# Patient Record
Sex: Female | Born: 1955 | Race: White | Hispanic: No | Marital: Married | Smoking: Former smoker
Health system: Southern US, Community
[De-identification: ages and names within clinical notes are randomized; demographics above are authoritative.]

## PROBLEM LIST (undated history)

## (undated) DIAGNOSIS — R51 Headache: Secondary | ICD-10-CM

## (undated) DIAGNOSIS — M8580 Other specified disorders of bone density and structure, unspecified site: Secondary | ICD-10-CM

## (undated) DIAGNOSIS — Z808 Family history of malignant neoplasm of other organs or systems: Secondary | ICD-10-CM

## (undated) DIAGNOSIS — L409 Psoriasis, unspecified: Secondary | ICD-10-CM

## (undated) DIAGNOSIS — R112 Nausea with vomiting, unspecified: Secondary | ICD-10-CM

## (undated) DIAGNOSIS — R519 Headache, unspecified: Secondary | ICD-10-CM

## (undated) DIAGNOSIS — T4145XA Adverse effect of unspecified anesthetic, initial encounter: Secondary | ICD-10-CM

## (undated) DIAGNOSIS — M1990 Unspecified osteoarthritis, unspecified site: Secondary | ICD-10-CM

## (undated) DIAGNOSIS — H8109 Meniere's disease, unspecified ear: Secondary | ICD-10-CM

## (undated) DIAGNOSIS — Z803 Family history of malignant neoplasm of breast: Secondary | ICD-10-CM

## (undated) DIAGNOSIS — J189 Pneumonia, unspecified organism: Secondary | ICD-10-CM

## (undated) DIAGNOSIS — Z8042 Family history of malignant neoplasm of prostate: Secondary | ICD-10-CM

## (undated) DIAGNOSIS — H9319 Tinnitus, unspecified ear: Secondary | ICD-10-CM

## (undated) DIAGNOSIS — F419 Anxiety disorder, unspecified: Secondary | ICD-10-CM

## (undated) DIAGNOSIS — Z9889 Other specified postprocedural states: Secondary | ICD-10-CM

## (undated) DIAGNOSIS — K759 Inflammatory liver disease, unspecified: Secondary | ICD-10-CM

## (undated) DIAGNOSIS — R5383 Other fatigue: Secondary | ICD-10-CM

## (undated) DIAGNOSIS — C801 Malignant (primary) neoplasm, unspecified: Secondary | ICD-10-CM

## (undated) DIAGNOSIS — F32A Depression, unspecified: Secondary | ICD-10-CM

## (undated) DIAGNOSIS — R9431 Abnormal electrocardiogram [ECG] [EKG]: Secondary | ICD-10-CM

## (undated) DIAGNOSIS — K589 Irritable bowel syndrome without diarrhea: Secondary | ICD-10-CM

## (undated) DIAGNOSIS — T8859XA Other complications of anesthesia, initial encounter: Secondary | ICD-10-CM

## (undated) DIAGNOSIS — K2270 Barrett's esophagus without dysplasia: Secondary | ICD-10-CM

## (undated) HISTORY — DX: Family history of malignant neoplasm of breast: Z80.3

## (undated) HISTORY — DX: Headache, unspecified: R51.9

## (undated) HISTORY — DX: Headache: R51

## (undated) HISTORY — DX: Family history of malignant neoplasm of prostate: Z80.42

## (undated) HISTORY — DX: Meniere's disease, unspecified ear: H81.09

## (undated) HISTORY — DX: Other fatigue: R53.83

## (undated) HISTORY — DX: Depression, unspecified: F32.A

## (undated) HISTORY — DX: Other specified disorders of bone density and structure, unspecified site: M85.80

## (undated) HISTORY — PX: BREAST SURGERY: SHX581

## (undated) HISTORY — PX: DILATION AND CURETTAGE OF UTERUS: SHX78

## (undated) HISTORY — DX: Abnormal electrocardiogram (ECG) (EKG): R94.31

## (undated) HISTORY — DX: Malignant (primary) neoplasm, unspecified: C80.1

## (undated) HISTORY — DX: Unspecified osteoarthritis, unspecified site: M19.90

## (undated) HISTORY — DX: Psoriasis, unspecified: L40.9

## (undated) HISTORY — DX: Family history of malignant neoplasm of other organs or systems: Z80.8

## (undated) HISTORY — PX: OTHER SURGICAL HISTORY: SHX169

## (undated) HISTORY — DX: Irritable bowel syndrome, unspecified: K58.9

## (undated) HISTORY — DX: Anxiety disorder, unspecified: F41.9

## (undated) HISTORY — PX: TONSILLECTOMY: SUR1361

---

## 1986-06-24 DIAGNOSIS — H9312 Tinnitus, left ear: Secondary | ICD-10-CM | POA: Insufficient documentation

## 2005-02-15 ENCOUNTER — Ambulatory Visit: Payer: Self-pay | Admitting: Internal Medicine

## 2006-07-28 ENCOUNTER — Encounter (INDEPENDENT_AMBULATORY_CARE_PROVIDER_SITE_OTHER): Payer: Self-pay | Admitting: Specialist

## 2006-07-28 ENCOUNTER — Ambulatory Visit (HOSPITAL_COMMUNITY): Admission: RE | Admit: 2006-07-28 | Discharge: 2006-07-28 | Payer: Self-pay | Admitting: *Deleted

## 2008-04-23 ENCOUNTER — Encounter: Admission: RE | Admit: 2008-04-23 | Discharge: 2008-04-23 | Payer: Self-pay | Admitting: Otolaryngology

## 2009-02-26 ENCOUNTER — Encounter: Payer: Self-pay | Admitting: Internal Medicine

## 2010-03-01 ENCOUNTER — Encounter: Payer: Self-pay | Admitting: Internal Medicine

## 2010-03-02 ENCOUNTER — Encounter: Payer: Self-pay | Admitting: Internal Medicine

## 2010-03-08 ENCOUNTER — Encounter: Payer: Self-pay | Admitting: Internal Medicine

## 2010-03-15 ENCOUNTER — Encounter: Payer: Self-pay | Admitting: Internal Medicine

## 2010-03-15 DIAGNOSIS — R141 Gas pain: Secondary | ICD-10-CM | POA: Insufficient documentation

## 2010-03-15 DIAGNOSIS — R143 Flatulence: Secondary | ICD-10-CM

## 2010-03-15 DIAGNOSIS — N281 Cyst of kidney, acquired: Secondary | ICD-10-CM | POA: Insufficient documentation

## 2010-03-15 DIAGNOSIS — G609 Hereditary and idiopathic neuropathy, unspecified: Secondary | ICD-10-CM | POA: Insufficient documentation

## 2010-03-15 DIAGNOSIS — R5383 Other fatigue: Secondary | ICD-10-CM

## 2010-03-15 DIAGNOSIS — R5381 Other malaise: Secondary | ICD-10-CM | POA: Insufficient documentation

## 2010-03-15 DIAGNOSIS — R142 Eructation: Secondary | ICD-10-CM

## 2010-03-15 DIAGNOSIS — R109 Unspecified abdominal pain: Secondary | ICD-10-CM | POA: Insufficient documentation

## 2010-03-15 DIAGNOSIS — R110 Nausea: Secondary | ICD-10-CM | POA: Insufficient documentation

## 2010-03-18 ENCOUNTER — Ambulatory Visit: Payer: Self-pay | Admitting: Internal Medicine

## 2010-11-26 ENCOUNTER — Telehealth: Payer: Self-pay | Admitting: Internal Medicine

## 2011-01-05 ENCOUNTER — Ambulatory Visit: Payer: Self-pay | Admitting: Cardiovascular Disease

## 2011-01-18 NOTE — Letter (Signed)
Summary: [facility]   Imported By: Sherian Rein 03/16/2010 07:36:16  _____________________________________________________________________  External Attachment:    Type:   Image     Comment:   External Document

## 2011-01-18 NOTE — Letter (Signed)
Summary: [facility]   Imported By: Sherian Rein 03/16/2010 07:42:35  _____________________________________________________________________  External Attachment:    Type:   Image     Comment:   External Document

## 2011-01-18 NOTE — Letter (Signed)
Summary: Herbert Moors MD  Herbert Moors MD   Imported By: Lester Scenic 04/20/2010 08:12:22  _____________________________________________________________________  External Attachment:    Type:   Image     Comment:   External Document

## 2011-01-18 NOTE — Letter (Signed)
Summary: EGD Instructions  [facility]  [address] [place], [place] [address]   Phone: [phone]  Fax: [phone]       Monique [name]    08/28/1956    MRN: [id number]       Procedure Day /Date: Monday 04/19/10     Arrival Time: 9:30 am     Procedure Time: 10:30 am     Location of Procedure:                    _x  _ [facility] (4th Floor)  PREPARATION FOR ENDOSCOPY   On 04/19/10 THE DAY OF THE PROCEDURE:  1.   No solid foods, milk or milk products are allowed after midnight the night before your procedure.  2.   Do not drink anything colored red or purple.  Avoid juices with pulp.  No orange juice.  3.  You may drink clear liquids until 8:30 am, which is 2 hours before your procedure.                                                                                                CLEAR LIQUIDS INCLUDE: Water Jello Ice Popsicles Tea (sugar ok, no milk/cream) Powdered fruit flavored drinks Coffee (sugar ok, no milk/cream) Gatorade Juice: apple, white grape, white cranberry  Lemonade Clear bullion, consomm, broth Carbonated beverages (any kind) Strained chicken noodle soup Hard Candy   MEDICATION INSTRUCTIONS  Unless otherwise instructed, you should take regular prescription medications with a small sip of water as early as possible the morning of your procedure.                  OTHER INSTRUCTIONS  You will need a responsible adult at least 55 years of age to accompany you and drive you home.   This person must remain in the waiting room during your procedure.  Wear loose fitting clothing that is easily removed.  Leave jewelry and other valuables at home.  However, you may wish to bring a book to read or an iPod/MP3 player to listen to music as you wait for your procedure to start.  Remove all body piercing jewelry and leave at home.  Total time from sign-in until discharge is approximately 2-3 hours.  You should go  home directly after your procedure and rest.  You can resume normal activities the day after your procedure.  The day of your procedure you should not:   Drive   Make legal decisions   Operate machinery   Drink alcohol   Return to work  You will receive specific instructions about eating, activities and medications before you leave.    The above instructions have been reviewed and explained to me by  Hortense Ramal CMA Duncan Dull)  March 18, 2010 3:42 PM    I fully understand and can verbalize these instructions _____________________________ Date 03/18/10

## 2011-01-18 NOTE — Assessment & Plan Note (Signed)
Summary: severe and pain and bloating...em   History of Present Illness Visit Type: Initial Consult Primary GI MD: Lina Sar MD Primary Provider: Herb Grays, MD Requesting Provider: Herb Grays, MD Chief Complaint: Increase in upper abd pain x 6 months. Pt states the pain has radiated upward and she has a lot of bloating. Pt states in the morning she can fit into her pants and in the evening  the pants are very snug.  History of Present Illness:   30 ALT white female with abdominal bloating swelling and a diffuse of discomfort. Her appetite has been good and  she has been tolerating  regular diet. Her weight has increased from  usual 117 pounds to 122. She was evaluated by  [facility]  gastroenterology in 2006 and again in [place]  clinic in 2008 and had a colonoscopy and endoscopy which showed duodenal ulcer. She was put on Protonix 40 mg daily but discontinued it after her insurance refused to cover it. She at that time was on Naproxen . She continues to smoke. Her upper abdominal ultrasound recently showed small right renal cyst a normal gallbladder and common bile duct. Her liver function tests were normal.she currently denies any use of alcohol.   GI Review of Systems    Reports abdominal pain, belching, and  bloating.     Location of  Abdominal pain: upper abdomen.    Denies acid reflux, chest pain, dysphagia with liquids, dysphagia with solids, heartburn, loss of appetite, nausea, vomiting, vomiting blood, weight loss, and  weight gain.      Reports hemorrhoids and  rectal bleeding.     Denies anal fissure, black tarry stools, change in bowel habit, constipation, diarrhea, diverticulosis, fecal incontinence, heme positive stool, irritable bowel syndrome, jaundice, light color stool, liver problems, and  rectal pain.    Current Medications (verified): 1)  Vectical 3 Mcg/gm Oint (Calcitriol) .... Apply Two Times A Day 2)  Analpram-Hc 1-1 % Crea (Hydrocortisone Ace-Pramoxine) .... Apply As  Needed 3)  Alprazolam 0.5 Mg Tabs (Alprazolam) .... Take 1 Tablet By Mouth As Needed Anxiety/sleep 4)  Flexeril 10 Mg Tabs (Cyclobenzaprine Hcl) .... Take 1 Tablet By Mouth Three Times A Day As Needed Spasms 5)  Naproxen 500 Mg Tabs (Naproxen) .... Take 1 Tabelt By Mouth Two Times A Day With Food As Needed For Pain  Allergies (verified): 1)  ! Doxycycline 2)  ! Wellbutrin 3)  ! * Chantix  Past History:  Past Medical History: Reviewed history from 03/15/2010 and no changes required. Current Problems:  RENAL CYST (ICD-593.2) FATIGUE (ICD-780.79) PERIPHERAL NEUROPATHY (ICD-356.9) NAUSEA (ICD-787.02) ABDOMINAL BLOATING (ICD-787.3) ABDOMINAL PAIN, UNSPECIFIED SITE (ICD-789.00)    previously saw: [facility]/EGD [facility]-did not like dr.  Past Surgical History: removal of squamous cell skin cancer Sinus surgery  Family History: No FH of Colon Cancer: Family History of Prostate Cancer:Father Family History of Diabetes: Aunt  Social History: Married Patient currently smokes.  Alcohol Use - yes-socially Illicit Drug Use - no Daily Caffeine Use  Review of Systems       The patient complains of change in vision, fatigue, thirst - excessive, and urination - excessive.         Pertinent positive and negative review of systems were noted in the above HPI. All other ROS was otherwise negative.   Vital Signs:  Patient profile:   55 year old female Height:      61 inches Weight:      122.38 pounds BMI:  23.21 Pulse rate:   84 / minute Pulse rhythm:   regular BP sitting:   108 / 64  (right arm) Cuff size:   regular  Vitals Entered By: Christie Nottingham CMA Duncan Dull) (March 18, 2010 2:50 PM)  Physical Exam  General:  Well developed, well nourished, no acute distress. Eyes:  PERRLA, no icterus. Mouth:  No deformity or lesions, dentition normal. Neck:  Supple; no masses or thyromegaly. Lungs:  Clear throughout to auscultation. Heart:  Regular rate and rhythm;  no murmurs, rubs,  or bruits. Abdomen:  flat relaxed abdomen when lying down. Protuberant abdomen when standing up. There is no hernia. There is an overall decrease in muscle tone in the abdominal wall and some fatty tissue. There is no ascites. No fluid wave. Mild tenderness in epigastrium and subxiphoid area. Rectal:  Soft Hemoccult-negative stool. Extremities:  trace edema. Skin:  Intact without significant lesions or rashes. Psych:  Alert and cooperative. Normal mood and affect.   Impression & Recommendations:  Problem # 1:  ABDOMINAL BLOATING (ICD-787.3) Patient;'s bloating and abdominal protuberance may be a combination of a relaxed abdominal wall as well as weight gain. She definitely has digestive problems and a history of duodenal ulcer which needs to be rechecked. We will restart her on Prilosec 20 mg twice a day and proceed with an upper endoscopy. We will check her for H. pylori at that time. 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  Problem # 3:  ABDOMINAL PAIN, UNSPECIFIED SITE (ICD-789.00) Patient's abdominal ultrasound recently showed a normal gallbladder and poorly visualized pancreas. Orders: EGD (EGD)  Patient Instructions: 1)  Please come for your scheduled endoscopy on 04/19/10 at 10:30 am. You will need to come to [facility] 4th floor at 9:30 am for registration. 2)  Please pick up your prilosec prescription at the pharmacy. We have sent this electronically. 3)  Copy sent to : DTammy Leighton Parody, MD Prescriptions: PRILOSEC 20 MG CPDR (OMEPRAZOLE) Take 1 tablet by mouth two times a day  #60 x 3   Entered by:   Hortense Ramal CMA (AAMA)   Authorized by:   Hart Carwin MD   Signed by:   Hortense Ramal CMA (AAMA) on 03/18/2010   Method used:   Electronically to        [facility] [address] [address] (retail)       [address]       [place], [place]  [address]       Ph: [phone] or  [phone]       Fax: [phone]   RxID:   [id number]

## 2011-01-20 NOTE — Progress Notes (Signed)
Summary: med ?-needs office visit May 2012  Phone Note Call from Patient Call back at [facility] Phone [phone]   Caller: Patient Call For: Dr. Juanda Chance Reason for Call: Talk to Nurse Summary of Call: would like to discuss Prilosec refill denial Initial call taken by: Vallarie Mare,  November 26, 2010 11:47 AM  Follow-up for Phone Call        Patient states that she is doing much better since taking omeprazole 20 mg daily and is no longer having any symptoms. She also states that she is unable to afford endoscopy. Are you okay with me giving refills of omeprazole without endoscopy since she is better on meds alone? Follow-up by: Lamona Curl CMA Duncan Dull),  November 26, 2010 11:58 AM  Additional Follow-up for Phone Call Additional follow up Details #1::        she may continue the refills till May 2012 which will be 1 year since her OV, she will need an OV at that time if she wants a refill. Additional Follow-up by: Hart Carwin MD,  November 27, 2010 5:34 PM    Additional Follow-up for Phone Call Additional follow up Details #2::    prescription sent. Follow-up by: Lamona Curl CMA (AAMA),  November 29, 2010 10:05 AM  New/Updated Medications: PRILOSEC 20 MG CPDR (OMEPRAZOLE) Take 1 tablet by mouth two times a day Prescriptions: PRILOSEC 20 MG CPDR (OMEPRAZOLE) Take 1 tablet by mouth two times a day  #60 x 4   Entered by:   Lamona Curl CMA (AAMA)   Authorized by:   Hart Carwin MD   Signed by:   Lamona Curl CMA (AAMA) on 11/29/2010   Method used:   Electronically to        [facility] [address] (retail)       [address]       [place], [place]  [address]       Ph: [phone] or [phone]       Fax: [phone]   RxID:   [id number]

## 2011-05-06 NOTE — Op Note (Signed)
Monique [name], Monique [name]               ACCOUNT NO.:  [id number]   MEDICAL RECORD NO.:  [id number]          PATIENT TYPE:  AMB   LOCATION:  DAY                          FACILITY:  [facility]   PHYSICIAN:  Pershing Cox, M.D.DATE OF BIRTH:  [birth year]   DATE OF PROCEDURE:  DATE OF DISCHARGE:                                 OPERATIVE REPORT   PREOPERATIVE DIAGNOSIS:  Carcinoma in situ of the vulva.   POSTOPERATIVE DIAGNOSIS:  Carcinoma in situ of the vulva.   PROCEDURE:  Wide local excision of perineal skin.   SURGEON:  Pershing Cox, M.D.   ANESTHESIA:  General.   INDICATIONS FOR PROCEDURE:  This patient is followed by a dermatologist, Dr.  Danella Deis, who noted a grayish lesion on her vulva.  It showed the lesion was  carcinoma in situ.  The patient was examined in my office and had widespread  psoriasis of the vulva.  It was impossible to be sure where the lesion  stopped and started in the bed of psoriasis.  The patient has been treating  her  vigorously for the psoriasis and now is brought to the operating room  for wide local excision of the suspicious perineal area.   FINDINGS:  Colposcopy was performed by using acetic acid to stain the vulva.  There is a thickened grayish area just beneath the vagina in the midline.  From this lesion which was approximately 3/4 of 1 cm in size, the centimeter  margin was outlined in every direction.  A rhomboid flap was not necessary.   DESCRIPTION OF PROCEDURE:  [name] was brought to the operating room with her  IV in place.  She received 1 gram of Ancef in the holding area.  PAS  stockings were placed on her lower extremities.  Supine on the OR table, IV  sedation was administered, and anesthesia was established without  difficulty.  She was placed into Allen stirrups and colposcopy was  performed; see above.  Once that the lesion had been outlined, the patient  was prepped with a solution of Hibiclens.  She was then draped with sterile  linens.Then 0.5% Marcaine was inserted into the base of the planned excision  site and then a #15 blade was used to incise the marked area.  Metzenbaum  scissors was used to excise the lesion from its base, taking a depth of  about 0.5 cm.  The excised skin was marked at the central margin nearest the  vagina with a suture.  It was covered with a moist gauze to be carried to  pathology later on.  Bovie cautery was used to obtain hemostasis in the base  of the incision.  The 2-0 Vicryl sutures were used to bring the subcutaneous  tissues close together.  I was able to close the skin margins with 2-0  Vicryl sutures.  Initially, mattress  sutures were used to bring the edges together and then interrupted simple  sutures of 4-0 Vicryl were used.  The incision closed very well without  dog  ear.  The incision was cleaned and Dermabond was placed along the  surface of  the incision.  The patient was taken to the recovery room in good condition.      Pershing Cox, M.D.  Electronically Signed     MAJ/MEDQ  D:  07/28/2006  T:  07/28/2006  Job:  [id number]   cc:   Hope M. Danella Deis, M.D.  Fax: [phone]

## 2012-04-25 ENCOUNTER — Encounter: Payer: Self-pay | Admitting: *Deleted

## 2012-05-19 ENCOUNTER — Emergency Department (HOSPITAL_COMMUNITY)
Admission: EM | Admit: 2012-05-19 | Discharge: 2012-05-19 | Disposition: A | Discharge status: Home / Self Care | Payer: PRIVATE HEALTH INSURANCE | Attending: Emergency Medicine | Admitting: Emergency Medicine

## 2012-05-19 ENCOUNTER — Encounter (HOSPITAL_COMMUNITY): Payer: Self-pay

## 2012-05-19 ENCOUNTER — Emergency Department (HOSPITAL_COMMUNITY): Payer: PRIVATE HEALTH INSURANCE

## 2012-05-19 DIAGNOSIS — F17200 Nicotine dependence, unspecified, uncomplicated: Secondary | ICD-10-CM | POA: Insufficient documentation

## 2012-05-19 DIAGNOSIS — Y998 Other external cause status: Secondary | ICD-10-CM | POA: Insufficient documentation

## 2012-05-19 DIAGNOSIS — S91009A Unspecified open wound, unspecified ankle, initial encounter: Secondary | ICD-10-CM | POA: Insufficient documentation

## 2012-05-19 DIAGNOSIS — S81051A Open bite, right knee, initial encounter: Secondary | ICD-10-CM

## 2012-05-19 DIAGNOSIS — W540XXA Bitten by dog, initial encounter: Secondary | ICD-10-CM | POA: Insufficient documentation

## 2012-05-19 DIAGNOSIS — S81009A Unspecified open wound, unspecified knee, initial encounter: Secondary | ICD-10-CM | POA: Insufficient documentation

## 2012-05-19 DIAGNOSIS — Y9389 Activity, other specified: Secondary | ICD-10-CM | POA: Insufficient documentation

## 2012-05-19 HISTORY — DX: Barrett's esophagus without dysplasia: K22.70

## 2012-05-19 HISTORY — DX: Tinnitus, unspecified ear: H93.19

## 2012-05-19 MED ORDER — HYDROCODONE-ACETAMINOPHEN 5-325 MG PO TABS: 1.0000 | ORAL_TABLET | ORAL | Status: AC | PRN

## 2012-05-19 MED ORDER — HYDROMORPHONE HCL PF 1 MG/ML IJ SOLN
1.0000 mg | Freq: Once | INTRAMUSCULAR | Status: AC
  Administered 2012-05-19: 1 mg via INTRAMUSCULAR
  Filled 2012-05-19: qty 1

## 2012-05-19 MED ORDER — AMOXICILLIN-POT CLAVULANATE 875-125 MG PO TABS: 1.0000 | ORAL_TABLET | Freq: Two times a day (BID) | ORAL | Status: AC

## 2012-05-19 MED ORDER — OXYCODONE-ACETAMINOPHEN 5-325 MG PO TABS
2.0000 | ORAL_TABLET | Freq: Once | ORAL | Status: AC
  Administered 2012-05-19: 2 via ORAL
  Filled 2012-05-19: qty 2

## 2012-05-19 NOTE — ED Notes (Signed)
Got pt in bed with Dr. and chuck to go under pt's leg. I attempted to have rolling stand up light shine on pt's leg as Dr. Rosalia Hammers requested, but light was just hanging and Dr. Rosalia Hammers expressed she would rather move her to CDU to be able to take care of the pt's needs better.5:36pm JG.

## 2012-05-19 NOTE — ED Provider Notes (Addendum)
History   This chart was scribed for Hilario Quarry, MD by Toya Smothers. The patient was seen in room STRE6/STRE6. Patient's care was started at 1700.  CSN: [id number]  Arrival date & time 05/19/12  1700   First MD Initiated Contact with Patient 05/19/12 1727     Chief Complaint  Patient presents with  . Animal Bite   HPI  CAITLINN KLINKER is a 56 y.o. female who presents to the Emergency Department complaining of moderate severe pain in right knee with associate lacerations and puncture wounds onset 1 hour ago, denying rash and color change. Pt states that she got in the middle her and her neighbors dog whilst they were fighting and was bit on her R knee. Pt states that both of the dogs have had shots and believes that she is up to date on her Tetanus. Pt list allergies to bupropion HCl, Doxycycline, and Varenicline tartrate.   Past Medical History  Diagnosis Date  . Fatigue   . Psoriasis   . Generalized headaches   . Abnormal EKG    Past Surgical History  Procedure Date  . Tonsillectomy   . Skin cancer removal    Family History  Problem Relation Age of Onset  . Heart attack Father   . Hypertension Father   . Hypertension Mother    History  Substance Use Topics  . Smoking status: Passive Smoker    Types: Cigarettes  . Smokeless tobacco: Not on file  . Alcohol Use: Yes     occas    Review of Systems  Skin: Positive for wound. Negative for color change and rash.    Allergies  Bupropion hcl; Doxycycline; and Varenicline tartrate  Home Medications   Current Outpatient Rx  Name Route Sig Dispense Refill  . ALPRAZOLAM 0.5 MG PO TABS Oral Take 0.5 mg by mouth as needed.    . CYCLOBENZAPRINE HCL 10 MG PO TABS Oral Take 10 mg by mouth 3 (three) times daily as needed.    Marland Kitchen MECLIZINE HCL 25 MG PO TABS Oral Take 25 mg by mouth 3 (three) times daily as needed.    Marland Kitchen NAPROXEN 500 MG PO TABS Oral Take 500 mg by mouth 2 (two) times daily as needed.    Marland Kitchen NITROFURANTOIN  MACROCRYSTAL 100 MG PO CAPS Oral Take 100 mg by mouth as needed.    Marland Kitchen OMEPRAZOLE 20 MG PO CPDR Oral Take 20 mg by mouth 2 (two) times daily.    Marland Kitchen HYDROCORTISONE ACE-PRAMOXINE 1-1 % RE CREA Rectal Place rectally as needed.      BP 165/76  Pulse 87  Temp(Src) 98.2 F (36.8 C) (Oral)  Resp 18  SpO2 97%  Physical Exam  Musculoskeletal:       3 cm laceration superior lateral aspect of R knee. Jagged stellate total 4 cm just lateral knee.2 Puncture to medial anterior aspect of the R knee    ED Course  Procedures (including critical care time)  DIAGNOSTIC STUDIES: Oxygen Saturation is 97% on room air, normal by my interpretation.    COORDINATION OF CARE: 5:30PM- Evaluated state of Pt's present illness 5:37PM- Consult to verify need.   WEST,EMILY B Pt with dog bites to right knee. Tetanus is UTD,  Dogs' rabies vx are UTD. Xray discussed with patient. Wounds thoroughly irrigated by nurses with high power pressure irrigation from OR, 3L used.    LACERATION REPAIR Performed by: Rise Patience Consent: Verbal consent obtained. Risks and benefits: risks, benefits and alternatives  were discussed Patient identity confirmed: provided demographic data Time out performed prior to procedure Prepped and Draped in normal sterile fashion Wound explored  Laceration Location: right knee  Laceration Length: 10cm  No Foreign Bodies seen or palpated  Anesthesia: local infiltration  Local anesthetic: lidocaine 2% no epinephrine  Anesthetic total: 10 ml  Irrigation method: see above Amount of cleaning: extensive (see above)  Skin closure: 4-0 prolene  Number of sutures or staples: 3  Technique: simple interrupted.  Very loose approximation of tissues (2 lacerations, one linear, one stellate)  Patient tolerance: Patient tolerated the procedure well with no immediate complications.  Discussed wound care with patient, return precautions.  Pt d/c home with augmentin, pain medication.     End section by WEST,EMILY B    Labs Reviewed - No data to display Dg Knee Complete 4 Views Right  05/19/2012  *RADIOLOGY REPORT*  Clinical Data: Dog bite  RIGHT KNEE - COMPLETE 4+ VIEW  Comparison: None  Findings: No joint effusion.  No fracture or subluxation identified.  No radiopaque foreign bodies or soft tissue calcifications.  There are multifocal areas of soft tissue emphysema within the medial,  anterior and lateral soft tissues overlying the right knee.  IMPRESSION:  1.  Soft tissue emphysema. 2.  No bony abnormalities.  Original Report Authenticated By: Rosealee Albee, M.D.     1. Dog bite of knee, right, initial encounter       MDM  I personally performed the services described in this documentation, which was scribed in my presence. The recorded information has been reviewed and considered.   I performed a history and physical examination of Bertram Gala and discussed her management with Ms. Chad  I agree with the history, physical, assessment, and plan of care, with the following exceptions: None  I was present for the following procedures: laceration repair. Time Spent in Critical Care of the patient: None Time spent in discussions with the patient and family:10  Gertha Lichtenberg Corlis Leak, MD 05/26/12 1735  Hilario Quarry, MD 05/26/12 1735

## 2012-05-19 NOTE — Discharge Instructions (Signed)
Read the information below.  Keep the wounds clean and covered with plain gauze until they are healed. Take the antibiotics until they are gone.  You may take ibuprofen while taking the prescribed pain medication but please do not take additional tylenol to avoid overdose.  Return to the ER immediately if you develop redness, swelling, pus draining from the wound, or fevers greater than 100.4.  You may return to the ER at any time for worsening condition or any new symptoms that concern you.

## 2012-05-19 NOTE — ED Notes (Signed)
Pt with dog bite to right knee,

## 2016-06-17 DIAGNOSIS — H8109 Meniere's disease, unspecified ear: Secondary | ICD-10-CM | POA: Insufficient documentation

## 2016-12-19 DIAGNOSIS — C50919 Malignant neoplasm of unspecified site of unspecified female breast: Secondary | ICD-10-CM

## 2016-12-19 HISTORY — DX: Malignant neoplasm of unspecified site of unspecified female breast: C50.919

## 2016-12-19 HISTORY — PX: MASTECTOMY: SHX3

## 2017-05-04 ENCOUNTER — Other Ambulatory Visit: Payer: Self-pay | Admitting: Obstetrics & Gynecology

## 2017-05-04 ENCOUNTER — Other Ambulatory Visit (HOSPITAL_COMMUNITY)
Admission: RE | Admit: 2017-05-04 | Discharge: 2017-05-04 | Disposition: A | Discharge status: Home / Self Care | Payer: BLUE CROSS/BLUE SHIELD | Source: Ambulatory Visit | Attending: Obstetrics & Gynecology | Admitting: Obstetrics & Gynecology

## 2017-05-04 DIAGNOSIS — N6322 Unspecified lump in the left breast, upper inner quadrant: Secondary | ICD-10-CM | POA: Diagnosis not present

## 2017-05-04 DIAGNOSIS — Z01411 Encounter for gynecological examination (general) (routine) with abnormal findings: Secondary | ICD-10-CM | POA: Diagnosis not present

## 2017-05-04 DIAGNOSIS — Z124 Encounter for screening for malignant neoplasm of cervix: Secondary | ICD-10-CM | POA: Insufficient documentation

## 2017-05-04 DIAGNOSIS — N6459 Other signs and symptoms in breast: Secondary | ICD-10-CM | POA: Diagnosis not present

## 2017-05-04 DIAGNOSIS — N941 Unspecified dyspareunia: Secondary | ICD-10-CM | POA: Diagnosis not present

## 2017-05-08 LAB — CYTOLOGY - PAP
DIAGNOSIS: NEGATIVE
HPV (WINDOPATH): NOT DETECTED

## 2017-05-09 ENCOUNTER — Other Ambulatory Visit: Payer: Self-pay | Admitting: Obstetrics & Gynecology

## 2017-05-09 ENCOUNTER — Ambulatory Visit
Admission: RE | Admit: 2017-05-09 | Discharge: 2017-05-09 | Disposition: A | Discharge status: Home / Self Care | Payer: BLUE CROSS/BLUE SHIELD | Source: Ambulatory Visit | Attending: Obstetrics & Gynecology | Admitting: Obstetrics & Gynecology

## 2017-05-09 DIAGNOSIS — R928 Other abnormal and inconclusive findings on diagnostic imaging of breast: Secondary | ICD-10-CM | POA: Diagnosis not present

## 2017-05-09 DIAGNOSIS — N6459 Other signs and symptoms in breast: Secondary | ICD-10-CM

## 2017-05-09 DIAGNOSIS — N6489 Other specified disorders of breast: Secondary | ICD-10-CM | POA: Diagnosis not present

## 2017-05-09 DIAGNOSIS — R599 Enlarged lymph nodes, unspecified: Secondary | ICD-10-CM

## 2017-05-09 DIAGNOSIS — N6320 Unspecified lump in the left breast, unspecified quadrant: Secondary | ICD-10-CM

## 2017-05-11 ENCOUNTER — Other Ambulatory Visit: Payer: Self-pay | Admitting: Obstetrics & Gynecology

## 2017-05-11 DIAGNOSIS — N6320 Unspecified lump in the left breast, unspecified quadrant: Secondary | ICD-10-CM

## 2017-05-12 ENCOUNTER — Ambulatory Visit
Admission: RE | Admit: 2017-05-12 | Discharge: 2017-05-12 | Disposition: A | Discharge status: Home / Self Care | Payer: BLUE CROSS/BLUE SHIELD | Source: Ambulatory Visit | Attending: Obstetrics & Gynecology | Admitting: Obstetrics & Gynecology

## 2017-05-12 DIAGNOSIS — R599 Enlarged lymph nodes, unspecified: Secondary | ICD-10-CM

## 2017-05-12 DIAGNOSIS — N6320 Unspecified lump in the left breast, unspecified quadrant: Secondary | ICD-10-CM

## 2017-05-12 DIAGNOSIS — C50412 Malignant neoplasm of upper-outer quadrant of left female breast: Secondary | ICD-10-CM | POA: Diagnosis not present

## 2017-05-12 DIAGNOSIS — N6321 Unspecified lump in the left breast, upper outer quadrant: Secondary | ICD-10-CM | POA: Diagnosis not present

## 2017-05-12 DIAGNOSIS — R590 Localized enlarged lymph nodes: Secondary | ICD-10-CM | POA: Diagnosis not present

## 2017-05-17 ENCOUNTER — Telehealth: Payer: Self-pay | Admitting: *Deleted

## 2017-05-17 NOTE — Telephone Encounter (Signed)
Confirmed [facility] for 05/24/17 at 815am .  Instructions and contact information given.

## 2017-05-23 ENCOUNTER — Other Ambulatory Visit: Payer: Self-pay | Admitting: *Deleted

## 2017-05-23 DIAGNOSIS — C50919 Malignant neoplasm of unspecified site of unspecified female breast: Secondary | ICD-10-CM

## 2017-05-24 ENCOUNTER — Ambulatory Visit: Payer: BLUE CROSS/BLUE SHIELD | Attending: Surgery | Admitting: Physical Therapy

## 2017-05-24 ENCOUNTER — Encounter: Payer: Self-pay | Admitting: Physical Therapy

## 2017-05-24 ENCOUNTER — Ambulatory Visit (HOSPITAL_BASED_OUTPATIENT_CLINIC_OR_DEPARTMENT_OTHER): Payer: BLUE CROSS/BLUE SHIELD | Admitting: Hematology and Oncology

## 2017-05-24 ENCOUNTER — Encounter: Payer: Self-pay | Admitting: Hematology and Oncology

## 2017-05-24 ENCOUNTER — Other Ambulatory Visit: Payer: Self-pay | Admitting: *Deleted

## 2017-05-24 ENCOUNTER — Other Ambulatory Visit (HOSPITAL_BASED_OUTPATIENT_CLINIC_OR_DEPARTMENT_OTHER): Payer: BLUE CROSS/BLUE SHIELD

## 2017-05-24 ENCOUNTER — Telehealth: Payer: Self-pay | Admitting: *Deleted

## 2017-05-24 ENCOUNTER — Ambulatory Visit
Admission: RE | Admit: 2017-05-24 | Discharge: 2017-05-24 | Disposition: A | Discharge status: Home / Self Care | Payer: BLUE CROSS/BLUE SHIELD | Source: Ambulatory Visit | Attending: Radiation Oncology | Admitting: Radiation Oncology

## 2017-05-24 ENCOUNTER — Encounter: Payer: Self-pay | Admitting: *Deleted

## 2017-05-24 ENCOUNTER — Ambulatory Visit: Payer: BLUE CROSS/BLUE SHIELD | Admitting: Oncology

## 2017-05-24 DIAGNOSIS — Z170 Estrogen receptor positive status [ER+]: Secondary | ICD-10-CM | POA: Diagnosis not present

## 2017-05-24 DIAGNOSIS — R293 Abnormal posture: Secondary | ICD-10-CM | POA: Diagnosis not present

## 2017-05-24 DIAGNOSIS — C50412 Malignant neoplasm of upper-outer quadrant of left female breast: Secondary | ICD-10-CM | POA: Insufficient documentation

## 2017-05-24 DIAGNOSIS — G8929 Other chronic pain: Secondary | ICD-10-CM | POA: Diagnosis not present

## 2017-05-24 DIAGNOSIS — M25512 Pain in left shoulder: Secondary | ICD-10-CM | POA: Diagnosis not present

## 2017-05-24 DIAGNOSIS — Z7722 Contact with and (suspected) exposure to environmental tobacco smoke (acute) (chronic): Secondary | ICD-10-CM | POA: Diagnosis not present

## 2017-05-24 DIAGNOSIS — C50912 Malignant neoplasm of unspecified site of left female breast: Secondary | ICD-10-CM | POA: Diagnosis not present

## 2017-05-24 DIAGNOSIS — M25612 Stiffness of left shoulder, not elsewhere classified: Secondary | ICD-10-CM | POA: Diagnosis not present

## 2017-05-24 DIAGNOSIS — C50919 Malignant neoplasm of unspecified site of unspecified female breast: Secondary | ICD-10-CM

## 2017-05-24 LAB — CBC WITH DIFFERENTIAL/PLATELET
BASO%: 0.9 % (ref 0.0–2.0)
BASOS ABS: 0.1 10*3/uL (ref 0.0–0.1)
EOS ABS: 0.3 10*3/uL (ref 0.0–0.5)
EOS%: 4.6 % (ref 0.0–7.0)
HEMATOCRIT: 41.6 % (ref 34.8–46.6)
HEMOGLOBIN: 14.1 g/dL (ref 11.6–15.9)
LYMPH#: 2.6 10*3/uL (ref 0.9–3.3)
LYMPH%: 34.2 % (ref 14.0–49.7)
MCH: 29.8 pg (ref 25.1–34.0)
MCHC: 34 g/dL (ref 31.5–36.0)
MCV: 87.5 fL (ref 79.5–101.0)
MONO#: 0.4 10*3/uL (ref 0.1–0.9)
MONO%: 5.9 % (ref 0.0–14.0)
NEUT#: 4.1 10*3/uL (ref 1.5–6.5)
NEUT%: 54.4 % (ref 38.4–76.8)
PLATELETS: 272 10*3/uL (ref 145–400)
RBC: 4.75 10*6/uL (ref 3.70–5.45)
RDW: 13.1 % (ref 11.2–14.5)
WBC: 7.5 10*3/uL (ref 3.9–10.3)

## 2017-05-24 LAB — COMPREHENSIVE METABOLIC PANEL
ALBUMIN: 4.1 g/dL (ref 3.5–5.0)
ALK PHOS: 67 U/L (ref 40–150)
ALT: 19 U/L (ref 0–55)
ANION GAP: 9 meq/L (ref 3–11)
AST: 22 U/L (ref 5–34)
BUN: 6 mg/dL — AB (ref 7.0–26.0)
CALCIUM: 9.8 mg/dL (ref 8.4–10.4)
CO2: 27 mEq/L (ref 22–29)
Chloride: 101 mEq/L (ref 98–109)
Creatinine: 0.8 mg/dL (ref 0.6–1.1)
EGFR: 84 mL/min/{1.73_m2} — AB (ref >90)
Glucose: 98 mg/dl (ref 70–140)
POTASSIUM: 4.1 meq/L (ref 3.5–5.1)
Sodium: 138 mEq/L (ref 136–145)
Total Bilirubin: 0.48 mg/dL (ref 0.20–1.20)
Total Protein: 7.1 g/dL (ref 6.4–8.3)

## 2017-05-24 LAB — DRAW EXTRA CLOT TUBE

## 2017-05-24 MED ORDER — ANASTROZOLE 1 MG PO TABS: 1.0000 mg | ORAL_TABLET | Freq: Every day | ORAL | 3 refills | Status: DC

## 2017-05-24 NOTE — Progress Notes (Signed)
Clinical Social Work [place] Psychosocial Distress Screening [place]  Patient completed distress screening protocol and scored a 6 on the Psychosocial Distress Thermometer which indicates moderate distress. Clinical Social Worker met with patient in [facility] to assess for distress and other psychosocial needs. Patient stated she was feeling overwhelmed but felt "better" after meeting with the treatment team and getting more information on her treatment plan. CSW and patient discussed common feeling and emotions when being diagnosed with cancer, and the importance of support during treatment. CSW informed patient of the support team and support services at [facility], and patient was agreeable to an [employer] referral. CSW provided contact information and encouraged patient to call with any questions or concerns.   ONCBCN DISTRESS SCREENING 05/24/2017  Screening Type Initial Screening  Distress experienced in past week (1-10) 6  Family Problem type Partner  Emotional problem type Nervousness/Anxiety  Spiritual/Religous concerns type Relating to God  Information Concerns Type Lack of info about diagnosis;Lack of info about treatment;Lack of info about complementary therapy choices  Physical Problem type Pain;Loss of appetitie;Constipation/diarrhea;Sexual problems  Physician notified of physical symptoms Yes  Referral to support programs Yes    Johnnye Lana, MSW, LCSW, OSW-C Clinical Social Worker [facility] [phone]
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
## 2017-05-24 NOTE — Progress Notes (Signed)
Radiation Oncology         [phone] ________________________________  Name: Monique [name] MRN: [id number]  Date: 05/24/2017  DOB: 1956-04-28  CC:No primary care provider on file.  Alphonsa Overall, MD     REFERRING PHYSICIAN: Alphonsa Overall, MD   DIAGNOSIS: There were no encounter diagnoses.   HISTORY OF PRESENT ILLNESS: Monique [name] is a 61 y.o. female seen in the multidisciplinary breast clinic for a new diagnosis of left breast cancer. She found a palpable mass with nipple inversion in the left breast and underwent diagnostic imaging and this revealed a 2.9 x 2.8 x 1.8 cm mass with irregularity in a left axillary node on  05/09/17. 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 The tumor was ER/PR positive, HER2 negative, and Ki 67 was 15%. She comes today to clinic to discuss options for her care.   PREVIOUS RADIATION THERAPY: No   PAST MEDICAL HISTORY:  Past Medical History:  Diagnosis Date  . Abnormal EKG   . Barrett's esophagus   . Fatigue   . Generalized headaches   . Psoriasis   . Tinnitus    left ear       PAST SURGICAL HISTORY: Past Surgical History:  Procedure Laterality Date  . skin cancer removal    . TONSILLECTOMY       FAMILY HISTORY:  Family History  Problem Relation Age of Onset  . Heart attack Father   . Hypertension Father   . Hypertension Mother      SOCIAL HISTORY:  reports that she is a non-smoker but has been exposed to tobacco smoke. She does not have any smokeless tobacco history on file. She reports that she drinks alcohol. The patient is married and lives in [place]. She is unable to work due to her meniere's disease.     ALLERGIES: Bupropion hcl; Doxycycline; and Varenicline tartrate   MEDICATIONS:  Current Outpatient Prescriptions  Medication Sig Dispense Refill  . ALPRAZolam (XANAX) 0.5 MG tablet Take 0.5 mg by mouth at  bedtime as needed. For anxiety and sleep    . OVER THE COUNTER MEDICATION Take 1 tablet by mouth daily as needed. For dizziness. Medication name unknown per patient.     No current facility-administered medications for this encounter.      REVIEW OF SYSTEMS: On review of systems, the patient reports that she is doing well overall. She denies any chest pain, shortness of breath, cough, fevers, chills, night sweats, unintended weight changes. She denies any bowel or bladder disturbances, and denies abdominal pain, nausea or vomiting. She denies any new musculoskeletal or joint aches or pains. A complete review of systems is obtained and is otherwise negative.  PHYSICAL EXAM:  Wt Readings from Last 3 Encounters:  03/18/10 122 lb 6.1 oz (55.5 kg)   Temp Readings from Last 3 Encounters:  05/19/12 98.6 F (37 C) (Oral)   BP Readings from Last 3 Encounters:  05/19/12 148/76  03/18/10 108/64   Pulse Readings from Last 3 Encounters:  05/19/12 72  03/18/10 84    In general this is a well appearing caucasian female in no acute distress. She is alert and oriented x4 and appropriate throughout the examination. HEENT reveals that the patient is normocephalic, atraumatic. EOMs are intact. PERRLA. Skin is intact without any evidence of gross lesions. Cardiovascular exam reveals a regular rate and rhythm, no  clicks rubs or murmurs are auscultated. Chest is clear to auscultation bilaterally. Lymphatic assessment is performed and does not reveal any adenopathy in the cervical, supraclavicular, axillary, or inguinal chains. Bilateral breast exam is performed and reveals post biopsy changes in the left breast, and no palpable abnormalities in the right. There was no nipple bleeding or discharge of either breast. Abdomen has active bowel sounds in all quadrants and is intact. The abdomen is soft, non tender, non distended. Lower extremities are negative for pretibial pitting edema, deep calf tenderness, cyanosis  or clubbing.   ECOG = 1  0 - Asymptomatic (Fully active, able to carry on all predisease activities without restriction)  1 - Symptomatic but completely ambulatory (Restricted in physically strenuous activity but ambulatory and able to carry out work of a light or sedentary nature. For example, light housework, office work)  2 - Symptomatic, <50% in bed during the day (Ambulatory and capable of all self care but unable to carry out any work activities. Up and about more than 50% of waking hours)  3 - Symptomatic, >50% in bed, but not bedbound (Capable of only limited self-care, confined to bed or chair 50% or more of waking hours)  4 - Bedbound (Completely disabled. Cannot carry on any self-care. Totally confined to bed or chair)  5 - Death   Eustace Pen MM, Creech RH, Tormey DC, et al. [id number]). "Toxicity and response criteria of the [facility]". [place] Oncol. 5 (6): 649-55    LABORATORY DATA:  No results found for: WBC, HGB, HCT, MCV, PLT No results found for: NA, K, CL, CO2 No results found for: ALT, AST, GGT, ALKPHOS, BILITOT    RADIOGRAPHY: US Breast Ltd Uni Left Inc Axilla  Result Date: 05/09/2017 CLINICAL DATA:  Mass felt by the patient in the upper outer left breast for the past month, with nipple inversion. EXAM: 2D DIGITAL DIAGNOSTIC BILATERAL MAMMOGRAM WITH CAD AND ADJUNCT TOMO ULTRASOUND LEFT BREAST COMPARISON:  02/15/2005 at [facility]. ACR Breast Density Category c: The breast tissue is heterogeneously dense, which may obscure small masses. FINDINGS: There is an irregular, spiculated mass in the upper-outer quadrant of the left breast at the location of the mass felt by the patient, marked with a metallic marker. There are no findings elsewhere in either breast suspicious for malignancy. Mammographic images were processed with CAD. On physical exam, there is an approximately 3 cm oval, firm, palpable mass in the 12:30 o'clock  position of the left breast, 2 cm from the nipple. There are no palpable left axillary lymph nodes. Targeted ultrasound is performed, showing a 2.9 x 2.8 x 1.8 cm oval, irregular, hypoechoic mass in the 12:30 o'clock position of the left breast, 2 cm from the nipple. There is some ill-defined surrounding increased echogenicity. No increased or decreased through transmission of sound. Ultrasound of the left axilla demonstrated multiple left axillary lymph nodes with borderline cortical thickening. One of these has a slightly more lobulated and irregular cortex with areas of slightly more focal thickening, measuring up to 4.2 mm in maximum thickness. IMPRESSION: 1. 2.9 x 2.8 x 1.8 cm mass with imaging features highly suspicious for malignancy in the 12:30 o'clock position of the left breast. 2. Left axillary lymph node with mild, lobulated and slightly irregular cortical thickening. This is suspicious for a metastatic node. RECOMMENDATION: Ultrasound-guided core needle biopsy of the 2.9 cm mass in the 12:30 o'clock position of the left breast and ultrasound-guided core needle biopsy  of the suspicious left axillary lymph node. This has been discussed with the patient and scheduled at 7:30 a.m. on 05/12/2017. I have discussed the findings and recommendations with the patient. Results were also provided in writing at the conclusion of the visit. If applicable, a reminder letter will be sent to the patient regarding the next appointment. BI-RADS CATEGORY  5: Highly suggestive of malignancy. Electronically Signed   By: Claudie Revering M.D.   On: 05/09/2017 15:58   Mm Diag Breast Tomo Bilateral  Result Date: 05/09/2017 CLINICAL DATA:  Mass felt by the patient in the upper outer left breast for the past month, with nipple inversion. EXAM: 2D DIGITAL DIAGNOSTIC BILATERAL MAMMOGRAM WITH CAD AND ADJUNCT TOMO ULTRASOUND LEFT BREAST COMPARISON:  02/15/2005 at [facility]. ACR Breast Density Category c: The  breast tissue is heterogeneously dense, which may obscure small masses. FINDINGS: There is an irregular, spiculated mass in the upper-outer quadrant of the left breast at the location of the mass felt by the patient, marked with a metallic marker. There are no findings elsewhere in either breast suspicious for malignancy. Mammographic images were processed with CAD. On physical exam, there is an approximately 3 cm oval, firm, palpable mass in the 12:30 o'clock position of the left breast, 2 cm from the nipple. There are no palpable left axillary lymph nodes. Targeted ultrasound is performed, showing a 2.9 x 2.8 x 1.8 cm oval, irregular, hypoechoic mass in the 12:30 o'clock position of the left breast, 2 cm from the nipple. There is some ill-defined surrounding increased echogenicity. No increased or decreased through transmission of sound. Ultrasound of the left axilla demonstrated multiple left axillary lymph nodes with borderline cortical thickening. One of these has a slightly more lobulated and irregular cortex with areas of slightly more focal thickening, measuring up to 4.2 mm in maximum thickness. IMPRESSION: 1. 2.9 x 2.8 x 1.8 cm mass with imaging features highly suspicious for malignancy in the 12:30 o'clock position of the left breast. 2. Left axillary lymph node with mild, lobulated and slightly irregular cortical thickening. This is suspicious for a metastatic node. RECOMMENDATION: Ultrasound-guided core needle biopsy of the 2.9 cm mass in the 12:30 o'clock position of the left breast and ultrasound-guided core needle biopsy of the suspicious left axillary lymph node. This has been discussed with the patient and scheduled at 7:30 a.m. on 05/12/2017. I have discussed the findings and recommendations with the patient. Results were also provided in writing at the conclusion of the visit. If applicable, a reminder letter will be sent to the patient regarding the next appointment. BI-RADS CATEGORY  5: Highly  suggestive of malignancy. Electronically Signed   By: Claudie Revering M.D.   On: 05/09/2017 15:58   [place] Axillary Node Core Biopsy Left  Addendum Date: 05/16/2017   ADDENDUM REPORT: 05/16/2017 14:11 ADDENDUM: Pathology revealed grade II invasive ductal carcinoma in the LEFT breast and benign lymph node tissue from the LEFT axillary lymph node biopsy. This was found to be concordant by Dr. Franki Cabot. Pathology results were discussed with the patient by telephone. The patient reported doing well after the biopsies with tenderness at the sites. Post biopsy instructions and care were reviewed and questions were answered. The patient was encouraged to call [facility] for any additional concerns. The patient was referred to the [facility] at the [facility] on May 24, 2017. Pathology results reported by Susa Raring RN, BSN on  05/16/2017. Electronically Signed   By: Franki Cabot M.D.   On: 05/16/2017 14:11   Result Date: 05/16/2017 CLINICAL DATA:  Patient with a left breast mass presents today for ultrasound-guided core biopsy. Patient also presents today for ultrasound-guided biopsy of a mildly prominent lymph node in the left axilla. EXAM: ULTRASOUND GUIDED LEFT BREAST CORE NEEDLE BIOPSY ULTRASOUND-GUIDED LEFT AXILLARY LYMPH NODE CORE NEEDLE BIOPSY COMPARISON:  Previous exam(s). PROCEDURE: I met with the patient and we discussed the procedure of ultrasound-guided biopsy, including benefits and alternatives. We discussed the high likelihood of a successful procedure. We discussed the risks of the procedure including infection, bleeding, tissue injury, clip migration, and inadequate sampling. Informed written consent was given. The usual time-out protocol was performed immediately prior to the procedure. Lesion quadrant: Upper outer quadrant Using sterile technique and 1% Lidocaine as local anesthetic, under direct ultrasound visualization,  a 14 gauge spring-loaded device was used to perform biopsy of the mildly prominent lymph node in the left axillausing a lateral approach. At the conclusion of the procedure, a HydroMARK tissue marker clip was deployed into the biopsy cavity. Next, using sterile technique and 1% lidocaine as local anesthetic, under direct ultrasound visualization, a 12 gauge spring-loaded device was used to perform biopsy of the left breast mass at the 12:30 o'clock axis using a lateral approach. At the conclusion of the procedure, a ribbon shaped tissue marker clip was placed into the mass. Follow-up 2-view mammogram was performed and dictated separately. IMPRESSION: Ultrasound-guided biopsies of the left breast mass at the 12:30 o'clock axis and the mildly prominent left axillary lymph node. No apparent complications. Electronically Signed: By: Franki Cabot M.D. On: 05/12/2017 09:02   Mm Clip Placement Left  Result Date: 05/12/2017 CLINICAL DATA:  Status post ultrasound-guided biopsy of a left breast mass. EXAM: DIAGNOSTIC LEFT MAMMOGRAM POST ULTRASOUND BIOPSY COMPARISON:  Previous exam(s). FINDINGS: Mammographic images were obtained following ultrasound guided biopsy of the left breast mass at the 12:30 o'clock axis. At the conclusion of the procedure, a ribbon shaped tissue marker was placed at the biopsy site. Biopsy clip is well-positioned within the mass. IMPRESSION: Ribbon shaped biopsy clip well-positioned within the left breast mass at the 12:30 o'clock axis. Final Assessment: Post Procedure Mammograms for Marker Placement Electronically Signed   By: Franki Cabot M.D.   On: 05/12/2017 09:20   [place] Lt Breast Bx W Loc Dev 1st Lesion Img Bx Spec US Guide  Addendum Date: 05/16/2017   ADDENDUM REPORT: 05/16/2017 14:11 ADDENDUM: Pathology revealed grade II invasive ductal carcinoma in the LEFT breast and benign lymph node tissue from the LEFT axillary lymph node biopsy. This was found to be concordant by Dr. Franki Cabot.  Pathology results were discussed with the patient by telephone. The patient reported doing well after the biopsies with tenderness at the sites. Post biopsy instructions and care were reviewed and questions were answered. The patient was encouraged to call [facility] for any additional concerns. The patient was referred to the [facility] at the [facility] on May 24, 2017. Pathology results reported by Susa Raring RN, BSN on 05/16/2017. Electronically Signed   By: Franki Cabot M.D.   On: 05/16/2017 14:11   Result Date: 05/16/2017 CLINICAL DATA:  Patient with a left breast mass presents today for ultrasound-guided core biopsy. Patient also presents today for ultrasound-guided biopsy of a mildly prominent lymph node in the left axilla. EXAM: ULTRASOUND GUIDED LEFT BREAST CORE  NEEDLE BIOPSY ULTRASOUND-GUIDED LEFT AXILLARY LYMPH NODE CORE NEEDLE BIOPSY COMPARISON:  Previous exam(s). PROCEDURE: I met with the patient and we discussed the procedure of ultrasound-guided biopsy, including benefits and alternatives. We discussed the high likelihood of a successful procedure. We discussed the risks of the procedure including infection, bleeding, tissue injury, clip migration, and inadequate sampling. Informed written consent was given. The usual time-out protocol was performed immediately prior to the procedure. Lesion quadrant: Upper outer quadrant Using sterile technique and 1% Lidocaine as local anesthetic, under direct ultrasound visualization, a 14 gauge spring-loaded device was used to perform biopsy of the mildly prominent lymph node in the left axillausing a lateral approach. At the conclusion of the procedure, a HydroMARK tissue marker clip was deployed into the biopsy cavity. Next, using sterile technique and 1% lidocaine as local anesthetic, under direct ultrasound visualization, a 12 gauge spring-loaded device was used to perform  biopsy of the left breast mass at the 12:30 o'clock axis using a lateral approach. At the conclusion of the procedure, a ribbon shaped tissue marker clip was placed into the mass. Follow-up 2-view mammogram was performed and dictated separately. IMPRESSION: Ultrasound-guided biopsies of the left breast mass at the 12:30 o'clock axis and the mildly prominent left axillary lymph node. No apparent complications. Electronically Signed: By: Franki Cabot M.D. On: 05/12/2017 09:02       IMPRESSION/PLAN: 1. Stage IB, cT2,N0,Mx, grade 2 ER/PR positive invasive ductal carcinoma of the left breast. Dr. Lisbeth Renshaw discusses the pathology findings and reviews the nature of invasive ductal disease. The consensus from the breast conference includesoncotype, and she will be started today on antiestrogen therapy. If she needed chemotherapy she would move forward with this prior to breast conservation with lumpectomy with sentinel mapping versus mastectomy with sentinel node evaluation. Provided that chemotherapy is not  necessary neoadjuvantly or adjuvantly, the patient's course would then be followed by external radiotherapy to the breast followed by antiestrogen therapy. We discussed the risks, benefits, short, and long term effects of radiotherapy, and the patient is interested in proceeding. Dr. Lisbeth Renshaw discusses the delivery and logistics of radiotherapy and would anticipate a course of 4-6 1/2 weeks dependant on her final pathology. We will see her back about 2 weeks after surgery, or 4 weeks after adjuvant chemotherapy if needed to move forward with the simulation and planning process and anticipate starting radiotherapy about 4 weeks after surgery.    The above documentation reflects my direct findings during this shared patient visit. Please see the separate note by Dr. Lisbeth Renshaw on this date for the remainder of the patient's plan of care.    Carola Rhine, PAC

## 2017-05-24 NOTE — Telephone Encounter (Signed)
Received order for oncotype testing on core bx. Requisition sent to [facility] and [facility]. PAC sent to [facility]

## 2017-05-24 NOTE — Progress Notes (Signed)
[facility] NOTE  Patient Care Team: Janyth Pupa, DO as PCP - General (Obstetrics and Gynecology) Janyth Pupa, DO as Consulting Physician (Obstetrics and Gynecology) Alphonsa Overall, MD as Consulting Physician (General Surgery) Magrinat, Virgie Dad, MD as Consulting Physician (Oncology) Kyung Rudd, MD as Consulting Physician (Radiation Oncology)  CHIEF COMPLAINTS/PURPOSE OF CONSULTATION:  Newly diagnosed breast cancer  HISTORY OF PRESENTING ILLNESS:  Monique [name] 61 y.o. female is here because of recent diagnosis left breast cancer. Patient had a palpable left breast mass with nipple inversion for the past 4-5 months. She underwent evaluation with a mammogram and ultrasound. There was a 2.9 cm mass in the left breast with suspicion of lymph node involvement. Biopsy of the breast mass and the lymph node were performed. Lymph node biopsy was benign. 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 She was presented this morning in the multidisciplinary clinic and she is here today to discuss a treatment plan.    I reviewed her records extensively and collaborated the history with the patient.  SUMMARY OF ONCOLOGIC HISTORY:   Malignant neoplasm of upper-outer quadrant of left breast in female, estrogen receptor positive ([place])   05/12/2017 Initial Diagnosis    Palpable left breast mass with nipple inversion, mammogram revealed mass at 12:30 position 2.9 cm with suspicious lymph node in axilla. Biopsy revealed IDC grade 2, ER 100%, PR 95%, Ki-67 15%, HER-2 negative ratio 1.68; lymph node biopsy benign, T2 N0 stage II a clinical stage       MEDICAL HISTORY:  Past Medical History:  Diagnosis Date  . Abnormal EKG   . Barrett's esophagus   . Fatigue   . Generalized headaches   . IBS (irritable bowel syndrome)   . Meniere disease   . Psoriasis   . Tinnitus    left ear    SURGICAL  HISTORY: Past Surgical History:  Procedure Laterality Date  . DILATION AND CURETTAGE OF UTERUS    . skin cancer removal    . TONSILLECTOMY      SOCIAL HISTORY: Social History   Social History  . Marital status: Married    Spouse name: N/A  . Number of children: N/A  . Years of education: N/A   Occupational History  . Not on file.   Social History Main Topics  . Smoking status: Passive Smoke Exposure - Never Smoker    Packs/day: 1.00    Types: Cigarettes  . Smokeless tobacco: Not on file  . Alcohol use Yes     Comment: occas  . Drug use: Unknown  . Sexual activity: Not on file   Other Topics Concern  . Not on file   Social History Narrative  . No narrative on file    FAMILY HISTORY: Family History  Problem Relation Age of Onset  . Heart attack Father   . Hypertension Father   . Hypertension Mother     ALLERGIES:  is allergic to bupropion; bupropion hcl; doxycycline; and varenicline tartrate.  MEDICATIONS:  Current Outpatient Prescriptions  Medication Sig Dispense Refill  . ALPRAZolam (XANAX) 0.5 MG tablet Take 0.5 mg by mouth at bedtime as needed. For anxiety and sleep    . HYDROcodone-acetaminophen (NORCO/VICODIN) 5-325 MG tablet Take 1 tablet by mouth every 6 (six) hours as needed for moderate pain.    Marland Kitchen OVER THE COUNTER MEDICATION Take 1 tablet by mouth daily as needed. For dizziness. Medication name  unknown per patient.    Marland Kitchen anastrozole (ARIMIDEX) 1 MG tablet Take 1 tablet (1 mg total) by mouth daily. 90 tablet 3   No current facility-administered medications for this visit.     REVIEW OF SYSTEMS:   Constitutional: Denies fevers, chills or abnormal night sweats Eyes: Denies blurriness of vision, double vision or watery eyes Ears, nose, mouth, throat, and face: Denies mucositis or sore throat Respiratory: Denies cough, dyspnea or wheezes Cardiovascular: Denies palpitation, chest discomfort or lower extremity swelling Gastrointestinal:  Denies nausea,  heartburn or change in bowel habits Skin: Denies abnormal skin rashes Lymphatics: Denies new lymphadenopathy or easy bruising Neurological:Denies numbness, tingling or new weaknesses Behavioral/Psych: Mood is stable, no new changes  Breast: Nipple inversion palpable lump All other systems were reviewed with the patient and are negative.  PHYSICAL EXAMINATION: ECOG PERFORMANCE STATUS: 1 - Symptomatic but completely ambulatory  Vitals:   05/24/17 0844  BP: 123/82  Pulse: 67  Resp: 18  Temp: 98.2 F (36.8 C)   Filed Weights   05/24/17 0844  Weight: 110 lb 1.6 oz (49.9 kg)    GENERAL:alert, no distress and comfortable SKIN: skin color, texture, turgor are normal, no rashes or significant lesions EYES: normal, conjunctiva are pink and non-injected, sclera clear OROPHARYNX:no exudate, no erythema and lips, buccal mucosa, and tongue normal  NECK: supple, thyroid normal size, non-tender, without nodularity LYMPH:  no palpable lymphadenopathy in the cervical, axillary or inguinal LUNGS: clear to auscultation and percussion with normal breathing effort HEART: regular rate & rhythm and no murmurs and no lower extremity edema ABDOMEN:abdomen soft, non-tender and normal bowel sounds Musculoskeletal:no cyanosis of digits and no clubbing  PSYCH: alert & oriented x 3 with fluent speech NEURO: no focal motor/sensory deficits BREAST:Nipple inversion palpable lump. No palpable axillary or supraclavicular lymphadenopathy (exam performed in the presence of a chaperone)   LABORATORY DATA:  I have reviewed the data as listed Lab Results  Component Value Date   WBC 7.5 05/24/2017   HGB 14.1 05/24/2017   HCT 41.6 05/24/2017   MCV 87.5 05/24/2017   PLT 272 05/24/2017   Lab Results  Component Value Date   NA 138 05/24/2017   K 4.1 05/24/2017   CO2 27 05/24/2017    RADIOGRAPHIC STUDIES: I have personally reviewed the radiological reports and agreed with the findings in the  report.  ASSESSMENT AND PLAN:  Malignant neoplasm of upper-outer quadrant of left breast in female, estrogen receptor positive ([place]) 05/17/2017: Palpable left breast mass with nipple inversion, mammogram revealed mass at 12:30 position 2.9 cm with suspicious lymph node in axilla. Biopsy revealed IDC grade 2, ER 100%, PR 95%, Ki-67 15%, HER-2 negative ratio 1.68; lymph node biopsy benign, T2 N0 stage II a clinical stage  Pathology and radiology counseling: Discussed with the patient, the details of pathology including the type of breast cancer,the clinical staging, the significance of ER, PR and HER-2/neu receptors and the implications for treatment. After reviewing the pathology in detail, we proceeded to discuss the different treatment options between surgery, radiation, chemotherapy, antiestrogen therapies.  Recommendation based multidisciplinary tumor board: 1. Oncotype DX to determine if she would benefit from systemic chemotherapy 2. neoadjuvant chemotherapy versus endocrine therapy 3. Followed by breast conserving surgery 4. Followed by radiation 5. Followed by antiestrogen therapy  Return to clinic based upon Oncotype DX test result. I recommended starting antiestrogen therapy from today.  Anastrozole counseling: We discussed the risks and benefits of anti-estrogen therapy with aromatase inhibitors. These include but  not limited to insomnia, hot flashes, mood changes, vaginal dryness, bone density loss, and weight gain. We strongly believe that the benefits far outweigh the risks. Patient understands these risks and consented to starting treatment.   Oncotype counseling: I discussed Oncotype DX test. I explained to the patient that this is a 21 gene panel to evaluate patient tumors DNA to calculate recurrence score. This would help determine whether patient has high risk or intermediate risk or low risk breast cancer. She understands that if her tumor was found to be high risk, she would  benefit from systemic chemotherapy. If low risk, no need of chemotherapy. If she was found to be intermediate risk, we would need to evaluate the score as well as other risk factors and determine if an abbreviated chemotherapy may be of benefit.  Return to clinic once we have Oncotype test results.   All questions were answered. The patient knows to call the clinic with any problems, questions or concerns.    Rulon Eisenmenger, MD 05/24/17

## 2017-05-24 NOTE — Progress Notes (Signed)
Nutrition Assessment  Reason for Assessment:  Pt seen in Breast Clinic  ASSESSMENT:   61 year old female with new diagnosis of left breast cancer.  Past medical history meniere's disease, IBS.  Medications:  reviewed  Labs: reviewed  Anthropometrics:   Height: 61 inches Weight: 110 lb BMI: 20.8   NUTRITION DIAGNOSIS: Food and nutrition related knowledge deficit related to new diagnosis of breast cancer as evidenced by no prior need for nutrition related information.  INTERVENTION:   Discussed and provided packet of information regarding nutritional tips for breast cancer patients.  Questions answered.  Teachback method used.  Contact information provided and patient knows to contact me with questions/concerns.    MONITORING, EVALUATION, and GOAL: Pt will consume a healthy plant based diet to maintain lean body mass throughout treatment.   Mayerli Kirst B. Zenia Resides, [place], [place] Registered Dietitian [phone] (pager)

## 2017-05-24 NOTE — Patient Instructions (Signed)

## 2017-05-24 NOTE — Assessment & Plan Note (Signed)
05/17/2017: Palpable left breast mass with nipple inversion, mammogram revealed mass at 12:30 position 2.9 cm with suspicious lymph node in axilla. Biopsy revealed IDC grade 2, ER 100%, PR 95%, Ki-67 15%, HER-2 negative ratio 1.68; lymph node biopsy benign, T2 N0 stage II a clinical stage  Pathology and radiology counseling: Discussed with the patient, the details of pathology including the type of breast cancer,the clinical staging, the significance of ER, PR and HER-2/neu receptors and the implications for treatment. After reviewing the pathology in detail, we proceeded to discuss the different treatment options between surgery, radiation, chemotherapy, antiestrogen therapies.  Recommendation based multidisciplinary tumor board: 1. Oncotype DX to determine if she would benefit from systemic chemotherapy 2. neoadjuvant chemotherapy versus endocrine therapy 3. Followed by breast conserving surgery 4. Followed by radiation 5. Followed by antiestrogen therapy  Return to clinic based upon Oncotype DX test result. I recommended starting antiestrogen therapy from today.  Anastrozole counseling: We discussed the risks and benefits of anti-estrogen therapy with aromatase inhibitors. These include but not limited to insomnia, hot flashes, mood changes, vaginal dryness, bone density loss, and weight gain. We strongly believe that the benefits far outweigh the risks. Patient understands these risks and consented to starting treatment.   Oncotype counseling: I discussed Oncotype DX test. I explained to the patient that this is a 21 gene panel to evaluate patient tumors DNA to calculate recurrence score. This would help determine whether patient has high risk or intermediate risk or low risk breast cancer. She understands that if her tumor was found to be high risk, she would benefit from systemic chemotherapy. If low risk, no need of chemotherapy. If she was found to be intermediate risk, we would need to  evaluate the score as well as other risk factors and determine if an abbreviated chemotherapy may be of benefit.  Return to clinic once we have Oncotype test results.

## 2017-05-25 ENCOUNTER — Other Ambulatory Visit: Payer: BLUE CROSS/BLUE SHIELD

## 2017-05-25 ENCOUNTER — Encounter: Payer: BLUE CROSS/BLUE SHIELD | Admitting: Genetics

## 2017-05-25 ENCOUNTER — Telehealth: Payer: Self-pay | Admitting: *Deleted

## 2017-05-25 NOTE — Telephone Encounter (Signed)
Receive call from patient stating that she is thinking about just having surgery up front and having a mastectomy because she would like to have everything done in this year due to meeting her deductible. She informed me that they had to sell her husband truck to meet their $6,000 deductible for this year.  She is scheduled for an MRI this Saturday.  I spoke with Dr. Lucia Gaskins and would prefer her to keep her appointment for her Breast MRI due to the density of her breasts.  He will call her after his clinic today to discuss.    Patient is ok with that.

## 2017-05-27 ENCOUNTER — Ambulatory Visit (HOSPITAL_COMMUNITY)
Admission: RE | Admit: 2017-05-27 | Discharge: 2017-05-27 | Disposition: A | Discharge status: Home / Self Care | Payer: BLUE CROSS/BLUE SHIELD | Source: Ambulatory Visit | Attending: Surgery | Admitting: Surgery

## 2017-05-27 DIAGNOSIS — Z170 Estrogen receptor positive status [ER+]: Secondary | ICD-10-CM | POA: Diagnosis not present

## 2017-05-27 DIAGNOSIS — N6453 Retraction of nipple: Secondary | ICD-10-CM | POA: Diagnosis not present

## 2017-05-27 DIAGNOSIS — C50412 Malignant neoplasm of upper-outer quadrant of left female breast: Secondary | ICD-10-CM | POA: Diagnosis not present

## 2017-05-27 DIAGNOSIS — N6321 Unspecified lump in the left breast, upper outer quadrant: Secondary | ICD-10-CM | POA: Diagnosis not present

## 2017-05-27 MED ORDER — GADOBENATE DIMEGLUMINE 529 MG/ML IV SOLN
10.0000 mL | Freq: Once | INTRAVENOUS | Status: AC | PRN
  Administered 2017-05-27: 10 mL via INTRAVENOUS

## 2017-05-29 ENCOUNTER — Ambulatory Visit (HOSPITAL_BASED_OUTPATIENT_CLINIC_OR_DEPARTMENT_OTHER): Payer: BLUE CROSS/BLUE SHIELD | Admitting: Genetic Counselor

## 2017-05-29 ENCOUNTER — Telehealth: Payer: Self-pay | Admitting: *Deleted

## 2017-05-29 ENCOUNTER — Other Ambulatory Visit: Payer: BLUE CROSS/BLUE SHIELD

## 2017-05-29 ENCOUNTER — Encounter: Payer: Self-pay | Admitting: Genetic Counselor

## 2017-05-29 DIAGNOSIS — Z315 Encounter for genetic counseling: Secondary | ICD-10-CM | POA: Diagnosis not present

## 2017-05-29 DIAGNOSIS — Z803 Family history of malignant neoplasm of breast: Secondary | ICD-10-CM | POA: Diagnosis not present

## 2017-05-29 DIAGNOSIS — Z8042 Family history of malignant neoplasm of prostate: Secondary | ICD-10-CM

## 2017-05-29 DIAGNOSIS — C50412 Malignant neoplasm of upper-outer quadrant of left female breast: Secondary | ICD-10-CM | POA: Diagnosis not present

## 2017-05-29 DIAGNOSIS — Z808 Family history of malignant neoplasm of other organs or systems: Secondary | ICD-10-CM | POA: Diagnosis not present

## 2017-05-29 DIAGNOSIS — Z170 Estrogen receptor positive status [ER+]: Principal | ICD-10-CM

## 2017-05-29 NOTE — Telephone Encounter (Signed)
  Oncology Nurse Navigator Documentation  Navigator Location: [facility] (05/29/17 1600)   )Navigator Encounter Type: Telephone (05/29/17 1600) Telephone: Outgoing Call;Clinic/[facility] Follow-up (05/29/17 1600)                                                  Time Spent with Patient: 15 (05/29/17 1600)

## 2017-05-29 NOTE — Progress Notes (Signed)
REFERRING PROVIDER: Nicholas Lose, MD [place],  [id number]  PRIMARY PROVIDER:  Janyth Pupa, DO  PRIMARY REASON FOR VISIT:  1. Malignant neoplasm of upper-outer quadrant of left breast in female, estrogen receptor positive ([place])   2. Family history of breast cancer   3. Family history of prostate cancer   4. Family history of thyroid cancer      HISTORY OF PRESENT ILLNESS:   Monique [name], a 61 y.o. female, was seen for a [facility] cancer genetics consultation at the request of Dr. Lindi Adie due to a personal and family history of cancer.  Monique [name] presents to clinic today to discuss the possibility of a hereditary predisposition to cancer, genetic testing, and to further clarify her future cancer risks, as well as potential cancer risks for family members.   In May 2018, at the age of 66, Monique [name] was diagnosed with invasive ductal carcinoma of the left breast. This will be treated with lumpectomy, pending genetics.  She is unsure whether she will need chemo, and is waiting on the oncotype.    CANCER HISTORY:    Malignant neoplasm of upper-outer quadrant of left breast in female, estrogen receptor positive ([place])   05/12/2017 Initial Diagnosis    Palpable left breast mass with nipple inversion, mammogram revealed mass at 12:30 position 2.9 cm with suspicious lymph node in axilla. Biopsy revealed IDC grade 2, ER 100%, PR 95%, Ki-67 15%, HER-2 negative ratio 1.68; lymph node biopsy benign, T2 N0 stage II a clinical stage        HORMONAL RISK FACTORS:  Menarche was at age 43.  First live birth at age N/A.  OCP use for approximately 14 years.  Ovaries intact: yes.  Hysterectomy: no.  Menopausal status: postmenopausal.  HRT use: 0 years. Colonoscopy: yes; normal. Mammogram within the last year: yes. Number of breast biopsies: 1. Up to date with pelvic exams:  yes. Any excessive radiation exposure in the past:  no  Past Medical History:   Diagnosis Date  . Abnormal EKG   . Barrett's esophagus   . Family history of breast cancer   . Family history of prostate cancer   . Family history of thyroid cancer   . Fatigue   . Generalized headaches   . IBS (irritable bowel syndrome)   . Meniere disease   . Psoriasis   . Tinnitus    left ear    Past Surgical History:  Procedure Laterality Date  . DILATION AND CURETTAGE OF UTERUS    . skin cancer removal    . TONSILLECTOMY      Social History   Social History  . Marital status: Married    Spouse name: N/A  . Number of children: N/A  . Years of education: N/A   Social History Main Topics  . Smoking status: Passive Smoke Exposure - Never Smoker    Packs/day: 1.00    Types: Cigarettes  . Smokeless tobacco: None  . Alcohol use Yes     Comment: occas  . Drug use: Unknown  . Sexual activity: Not Asked   Other Topics Concern  . None   Social History Narrative  . None     FAMILY HISTORY:  We obtained a detailed, 4-generation family history.  Significant diagnoses are listed below: Family History  Problem Relation Age of Onset  . Heart attack Father   . Hypertension Father   . Hypertension Mother   . Healthy Maternal Aunt   . Healthy  Maternal Uncle   . Breast cancer Paternal Aunt 20       bilateral  . Prostate cancer Paternal Uncle   . Healthy Paternal Aunt   . Thyroid cancer Paternal Uncle   . Healthy Paternal Uncle   . Breast cancer Cousin 72       paternal first cousin  . Breast cancer Cousin        paternal first cousin    The patient does not have children and is an only child.  Her parents are alive and well, at 81 and 41.  Her mother is the oldest of 8 children. She has six sisters and one brother, no one has cancer.  The patient's father is the youngest of 60 children.  He had one sister who had bilateral breast cancer at 80, a brother with prostate cancer and a twin brother with thyroid cancer. Two sisters who were unaffected with cancer had  a daughter with breast cancer.    Monique [name] is unaware of previous family history of genetic testing for hereditary cancer risks. Patient's maternal ancestors are of [place] descent, and paternal ancestors are of [place] descent. There is no reported Ashkenazi Jewish ancestry. There is no known consanguinity.  GENETIC COUNSELING ASSESSMENT: Monique [name] is a 61 y.o. female with a personal and family history of cancer which is somewhat suggestive of a hereditary cancer syndrome and predisposition to cancer. We, therefore, discussed and recommended the following at today's visit.   DISCUSSION: We discussed that about 5-10% of breast cancer is hereditary with most cases due to BRCA mutations.  Other genes associated with hereditary breast cancer syndromes include ATM, CHEK2 and PALB2.  We reviewed the characteristics, features and inheritance patterns of hereditary cancer syndromes. We also discussed genetic testing, including the appropriate family members to test, the process of testing, insurance coverage and turn-around-time for results. We discussed the implications of a negative, positive and/or variant of uncertain significant result. We recommended Monique [name] pursue genetic testing for the common hereditary cancer gene panel. The Hereditary Gene Panel offered by [facility] includes sequencing and/or deletion duplication testing of the following 46 genes: APC, ATM, AXIN2, BARD1, BMPR1A, BRCA1, BRCA2, BRIP1, CDH1, CDKN2A (p14ARF), CDKN2A (p16INK4a), CHEK2, CTNNA1, DICER1, EPCAM (Deletion/duplication testing only), GREM1 (promoter region deletion/duplication testing only), KIT, MEN1, MLH1, MSH2, MSH3, MSH6, MUTYH, NBN, NF1, NHTL1, PALB2, PDGFRA, PMS2, POLD1, POLE, PTEN, RAD50, RAD51C, RAD51D, SDHB, SDHC, SDHD, SMAD4, SMARCA4. STK11, TP53, TSC1, TSC2, and VHL.  The following genes were evaluated for sequence changes only: SDHA and HOXB13 c.251G>A variant only.  Based on Monique [name]'s personal and  family history of cancer, she meets medical criteria for genetic testing. Despite that she meets criteria, she may still have an out of pocket cost. We discussed that if her out of pocket cost for testing is over $100, the laboratory will call and confirm whether she wants to proceed with testing.  If the out of pocket cost of testing is less than $100 she will be billed by the genetic testing laboratory.   PLAN: After considering the risks, benefits, and limitations, Monique [name]  provided informed consent to pursue genetic testing and the blood sample was sent to [facility] for analysis of the Common Hereditary cancer panel. Results should be available within approximately 2-3 weeks' time, at which point they will be disclosed by telephone to Monique [name], as will any additional recommendations warranted by these results. Monique [name] will receive a summary of her genetic counseling  visit and a copy of her results once available. This information will also be available in Epic. We encouraged Monique [name] to remain in contact with cancer genetics annually so that we can continuously update the family history and inform her of any changes in cancer genetics and testing that may be of benefit for her family. Monique [name] questions were answered to her satisfaction today. Our contact information was provided should additional questions or concerns arise.  Based on Monique [name]'s family history, we recommended her cousin, who was diagnosed with breast cancer at age 88, have genetic counseling and testing. Monique [name] will let us know if we can be of any assistance in coordinating genetic counseling and/or testing for this family member.   Lastly, we encouraged Monique [name] to remain in contact with cancer genetics annually so that we can continuously update the family history and inform her of any changes in cancer genetics and testing that may be of benefit for this family.   Ms.  [name] questions were answered  to her satisfaction today. Our contact information was provided should additional questions or concerns arise. Thank you for the referral and allowing [place] to share in the care of your patient.   Karen P. Florene Glen, [place], [facility] Certified Genetic Counselor [email or web address] phone: [phone]  The patient was seen for a total of 45 minutes in face-to-face genetic counseling.  This patient was discussed with Drs. Magrinat, Lindi Adie and/or Burr Medico who agrees with the above.    _______________________________________________________________________ For Office Staff:  Number of people involved in session: 1 Was an Intern/ student involved with case: no

## 2017-05-30 ENCOUNTER — Ambulatory Visit: Payer: BLUE CROSS/BLUE SHIELD | Admitting: Physical Therapy

## 2017-05-30 ENCOUNTER — Encounter: Payer: Self-pay | Admitting: Physical Therapy

## 2017-05-30 DIAGNOSIS — Z170 Estrogen receptor positive status [ER+]: Secondary | ICD-10-CM | POA: Diagnosis not present

## 2017-05-30 DIAGNOSIS — M25512 Pain in left shoulder: Secondary | ICD-10-CM | POA: Diagnosis not present

## 2017-05-30 DIAGNOSIS — R293 Abnormal posture: Secondary | ICD-10-CM

## 2017-05-30 DIAGNOSIS — M25612 Stiffness of left shoulder, not elsewhere classified: Secondary | ICD-10-CM | POA: Diagnosis not present

## 2017-05-30 DIAGNOSIS — G8929 Other chronic pain: Secondary | ICD-10-CM

## 2017-05-30 DIAGNOSIS — C50412 Malignant neoplasm of upper-outer quadrant of left female breast: Secondary | ICD-10-CM | POA: Diagnosis not present
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
## 2017-05-30 NOTE — Patient Instructions (Signed)
Shoulder: Flexion (Supine)    With hands shoulder width apart, slowly lower dowel to floor behind head. Do not let elbows bend. Keep back flat. Hold 5-10____ seconds. Repeat _10___ times. Do _1___ sessions per day. CAUTION: Stretch slowly and gently.  Copyright  VHI. All rights reserved.  Shoulder: Abduction (Supine)    With right arm flat on floor, hold dowel in palm. Slowly move arm up to side of head by pushing with opposite arm. Do not let elbow bend. Hold __10-15__ seconds. Repeat _10___ times. Do __1__ sessions per day. CAUTION: Stretch slowly and gently.  Copyright  VHI. All rights reserved.

## 2017-05-31 DIAGNOSIS — Z170 Estrogen receptor positive status [ER+]: Secondary | ICD-10-CM | POA: Diagnosis not present

## 2017-05-31 DIAGNOSIS — C50412 Malignant neoplasm of upper-outer quadrant of left female breast: Secondary | ICD-10-CM | POA: Diagnosis not present

## 2017-06-01 ENCOUNTER — Telehealth: Payer: Self-pay | Admitting: *Deleted

## 2017-06-01 ENCOUNTER — Ambulatory Visit: Payer: BLUE CROSS/BLUE SHIELD

## 2017-06-01 DIAGNOSIS — M25512 Pain in left shoulder: Secondary | ICD-10-CM | POA: Diagnosis not present

## 2017-06-01 DIAGNOSIS — M25612 Stiffness of left shoulder, not elsewhere classified: Secondary | ICD-10-CM

## 2017-06-01 DIAGNOSIS — C50412 Malignant neoplasm of upper-outer quadrant of left female breast: Secondary | ICD-10-CM | POA: Diagnosis not present

## 2017-06-01 DIAGNOSIS — G8929 Other chronic pain: Secondary | ICD-10-CM | POA: Diagnosis not present

## 2017-06-01 DIAGNOSIS — Z170 Estrogen receptor positive status [ER+]: Secondary | ICD-10-CM | POA: Diagnosis not present

## 2017-06-01 DIAGNOSIS — R293 Abnormal posture: Secondary | ICD-10-CM | POA: Diagnosis not present

## 2017-06-01 NOTE — Telephone Encounter (Signed)
Received oncotype results of 5 from her core biopsy.  Spoke with patient and she is aware of these results.  Informed her I would let the team know. She is anxious to have surgery.

## 2017-06-01 NOTE — Therapy (Signed)
Monique [name], [place], [address] Phone: [phone]   Fax:  [phone]  Physical Therapy Treatment  Patient Details  Name: Monique [name] MRN: [id number] Date of Birth: [birth year] Referring Provider: Dr. Alphonsa Overall  Encounter Date: 06/01/2017      PT End of Session - 06/01/17 1105    Visit Number 3   Number of Visits 9   Date for PT Re-Evaluation 06/21/17   PT Start Time 1016   PT Stop Time 1104   PT Time Calculation (min) 48 min   Activity Tolerance Patient tolerated treatment well   Behavior During Therapy [facility] for tasks assessed/performed      Past Medical History:  Diagnosis Date  . Abnormal EKG   . Barrett's esophagus   . Family history of breast cancer   . Family history of prostate cancer   . Family history of thyroid cancer   . Fatigue   . Generalized headaches   . IBS (irritable bowel syndrome)   . Meniere disease   . Psoriasis   . Tinnitus    left ear    Past Surgical History:  Procedure Laterality Date  . DILATION AND CURETTAGE OF UTERUS    . skin cancer removal    . TONSILLECTOMY      There were no vitals filed for this visit.      Subjective Assessment - 06/01/17 1020    Subjective I just found out I don't need chemo so I'm excited about that! I felt really good after last session. My shoulder is actually feeling better now than it has in years.    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 It measures 2.9 cm and is located in the upper outer quadrant. A lymph node was biopsied and was negative. Her left shoulder pain has gone untreated and now her ROM is significantly limited and impacting daily tasks.   Patient Stated Goals Learn post op shoulder ROM HEP, reduce lymphedema risk, dress, squeeze shampoo bottle, and reach for seatbelt with less  difficulty.   Currently in Pain? No/denies            [facility] PT Assessment - 06/01/17 0001      AROM   Left Shoulder ABduction 152 Degrees  With slow movement                     [facility] Adult PT Treatment/Exercise - 06/01/17 0001      Shoulder Exercises: Pulleys   Flexion 2 minutes   Flexion Limitations VC to decrease Lt scapular compensation   ABduction 2 minutes   ABduction Limitations VC initially to decrease compensation then pt able to return correct demonstration     Shoulder Exercises: Therapy Ball   Flexion 10 reps  With forward lean into end of stretch   ABduction 10 reps  Lt UE with side lean into end of stretch     Shoulder Exercises: Stretch   Corner Stretch 3 reps;10 seconds  In Administrator, arts Limitations VC to be careful not to push into pain     Manual Therapy   Manual Therapy Myofascial release;Passive ROM;Scapular mobilization   Myofascial Release gentle myofascial pulling into abduction   Scapular Mobilization to left shoulder in right sidelying in direction of protraction and PROM of left shoulder into flexion   Passive ROM  gentle PROM to left shoulder in direction of flexion, abduction and ER                 Long Term Clinic Goals - 05/24/17 1141      CC Long Term Goal  #1   Title Patient will demonstrate a home exercise program for improving shoulder ROM.   Time 4   Period Weeks   Status New     CC Long Term Goal  #2   Title Patient will increase left shoulder flexion and abduction ROM to be >/= 150 degrees for increased ease reaching and to obtain radiation positioning.   Time 4   Period Weeks   Status New     CC Long Term Goal  #3   Title Patient will report >/= 25% less difficulty dressing.   Time 4   Period Weeks   Status New     CC Long Term Goal  #4   Title Patient will report >/= 25% less difficulty reaching for her seatbelt with her left hand while in driver's seat.   Time 4   Period Weeks    Status New     CC Long Term Goal  #5   Title Patient will report >/= 25% less difficulty squeezing a shampoo bottle.   Time 4   Period Weeks   Status New            Plan - 06/01/17 1105    Clinical Impression Statement Pt continued to tolerate exercises well and liked being shown things she can cont doing at home (doorway stretch and instructed her to "sprinkle" exercises in during day with A/ROM). Wasn't able to achieve full P/ROM today but pt reports still feeling a little sore from last visit. She is very pleased with progress thus far reporting her arm feeling better now than it has in years.    Rehab Potential Good   Clinical Impairments Affecting Rehab Potential Chronic condition   PT Frequency 2x / week   PT Duration 4 weeks   PT Treatment/Interventions ADLs/Self Care Home Management;Electrical Stimulation;Iontophoresis 75m/ml Dexamethasone;Moist Heat;Therapeutic activities;Therapeutic exercise;Patient/family education;Passive range of motion;Manual techniques;Dry needling   PT Next Visit Plan PROM to pt tolerance; scapular moblization, cont ROM exercises   Consulted and Agree with Plan of Care Patient      Patient will benefit from skilled therapeutic intervention in order to improve the following deficits and impairments:  Decreased knowledge of precautions, Decreased range of motion, Postural dysfunction, Pain, Impaired UE functional use  Visit Diagnosis: Abnormal posture  Chronic left shoulder pain  Stiffness of left shoulder, not elsewhere classified     Problem List Patient Active Problem List   Diagnosis Date Noted  . Family history of breast cancer   . Family history of prostate cancer   . Family history of thyroid cancer   . Malignant neoplasm of upper-outer quadrant of left breast in [place], [place] positive [place] 05/24/2017  . PERIPHERAL NEUROPATHY 03/15/2010  . RENAL CYST 03/15/2010  . FATIGUE 03/15/2010  . NAUSEA 03/15/2010  . ABDOMINAL  BLOATING 03/15/2010  . ABDOMINAL PAIN, UNSPECIFIED SITE 03/15/2010    ROtelia Limes PTA 06/01/2017, 11:08 AM  [address] [place] [address]Phone: [phone]  Fax:  [phone] Name: Monique MUSTOMRN: [id number] of Birth: 1October 12, 1957

## 2017-06-06 ENCOUNTER — Ambulatory Visit: Payer: BLUE CROSS/BLUE SHIELD

## 2017-06-06 DIAGNOSIS — R293 Abnormal posture: Secondary | ICD-10-CM | POA: Diagnosis not present

## 2017-06-06 DIAGNOSIS — M25612 Stiffness of left shoulder, not elsewhere classified: Secondary | ICD-10-CM

## 2017-06-06 DIAGNOSIS — Z170 Estrogen receptor positive status [ER+]: Secondary | ICD-10-CM | POA: Diagnosis not present

## 2017-06-06 DIAGNOSIS — G8929 Other chronic pain: Secondary | ICD-10-CM | POA: Diagnosis not present

## 2017-06-06 DIAGNOSIS — M25512 Pain in left shoulder: Secondary | ICD-10-CM

## 2017-06-06 DIAGNOSIS — C50412 Malignant neoplasm of upper-outer quadrant of left female breast: Secondary | ICD-10-CM

## 2017-06-06 NOTE — Therapy (Signed)
[place], [place], [address] Phone: [phone]   Fax:  [phone]  Physical Therapy Treatment  Patient Details  Name: Monique [name] MRN: [id number] Date of Birth: 05/13/1956 Referring Provider: Dr. Alphonsa Overall  Encounter Date: 06/06/2017      PT End of Session - 06/06/17 1102    Visit Number 4   Number of Visits 9   Date for PT Re-Evaluation 06/21/17   PT Start Time 1017   PT Stop Time 1101   PT Time Calculation (min) 44 min   Activity Tolerance Patient tolerated treatment well   Behavior During Therapy [facility] for tasks assessed/performed      Past Medical History:  Diagnosis Date  . Abnormal EKG   . Barrett's esophagus   . Family history of breast cancer   . Family history of prostate cancer   . Family history of thyroid cancer   . Fatigue   . Generalized headaches   . IBS (irritable bowel syndrome)   . Meniere disease   . Psoriasis   . Tinnitus    left ear    Past Surgical History:  Procedure Laterality Date  . DILATION AND CURETTAGE OF UTERUS    . skin cancer removal    . TONSILLECTOMY      There were no vitals filed for this visit.      Subjective Assessment - 06/06/17 1018    Subjective My Lt shoulder has been feeling great but neck has been bothering me since I was here last. It just is so tight on my Lt side. I can reach back to grab and buckle my seatbelt with my Lt arm now and I haven't been able to do that for years!   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 It measures 2.9 cm and is located in the upper outer quadrant. A lymph node was biopsied and was negative. Her left shoulder pain has gone untreated and now her ROM is significantly limited and impacting daily tasks.   Patient Stated Goals Learn post op shoulder ROM HEP, reduce lymphedema risk,  dress, squeeze shampoo bottle, and reach for seatbelt with less difficulty.   Currently in Pain? No/denies                         [facility] Adult PT Treatment/Exercise - 06/06/17 0001      Shoulder Exercises: Pulleys   Flexion 2 minutes   ABduction 2 minutes   ABduction Limitations VC to decrease scapular compensations     Shoulder Exercises: Therapy Ball   Flexion 10 reps  With forward lean into end of stretch   ABduction 10 reps  Lt UE with side lean into end of stretch     Shoulder Exercises: ROM/Strengthening   "W" Arms Standing with back against wall but painful (even with arms straiht) so stopped both   Other ROM/Strengthening Exercises Modified downward dog 5 times with 5 second holds     Manual Therapy   Manual Therapy Myofascial release;Passive ROM;Scapular mobilization   Myofascial Release gentle myofascial pulling into abduction; also to Lt upper trap to decrease tightness felt here   Scapular Mobilization to left shoulder in right sidelying in direction of protraction and PROM of left shoulder into flexion   Passive ROM gentle PROM to left shoulder in direction of flexion, abduction  and ER; also into Rt cervical side bend and rotation to decrease tightness pt has been feeling with shoulder ROM since last visit at Lt upper trap                       [facility] - 05/24/17 1141      CC Long Term Goal  #1   Title Patient will demonstrate a home exercise program for improving shoulder ROM.   Time 4   Period Weeks   Status New     CC Long Term Goal  #2   Title Patient will increase left shoulder flexion and abduction ROM to be >/= 150 degrees for increased ease reaching and to obtain radiation positioning.   Time 4   Period Weeks   Status New     CC Long Term Goal  #3   Title Patient will report >/= 25% less difficulty dressing.   Time 4   Period Weeks   Status New     CC Long Term Goal  #4   Title Patient will report  >/= 25% less difficulty reaching for her seatbelt with her left hand while in driver's seat.   Time 4   Period Weeks   Status New     CC Long Term Goal  #5   Title Patient will report >/= 25% less difficulty squeezing a shampoo bottle.   Time 4   Period Weeks   Status New            Plan - 06/06/17 1103    Clinical Impression Statement Pt tolerated session well except had pain with attempt of "W" stretch with back on wall. Also incorporated brief myofascial stretching to Lt upper trap and pt reported whole Lt shoulder feeling much looser after session today. Was able to get pt to near full P/ROM by end of stretching, especially after scapular mobs.      Rehab Potential Good   Clinical Impairments Affecting Rehab Potential Chronic condition   PT Frequency 2x / week   PT Duration 4 weeks   PT Treatment/Interventions ADLs/Self Care Home Management;Electrical Stimulation;Iontophoresis 47m/ml Dexamethasone;Moist Heat;Therapeutic activities;Therapeutic exercise;Patient/family education;Passive range of motion;Manual techniques;Dry needling   PT Next Visit Plan PROM to pt tolerance; scapular moblization, cont ROM exercises   Consulted and Agree with Plan of Care Patient      Patient will benefit from skilled therapeutic intervention in order to improve the following deficits and impairments:  Decreased knowledge of precautions, Decreased range of motion, Postural dysfunction, Pain, Impaired UE functional use  Visit Diagnosis: Abnormal posture  Chronic left shoulder pain  Stiffness of left shoulder, not elsewhere classified  Carcinoma of upper-outer quadrant of left breast in female, estrogen receptor positive ([place]     Problem List Patient Active Problem List   Diagnosis Date Noted  . Family history of breast cancer   . Family history of prostate cancer   . Family history of thyroid cancer   . Malignant neoplasm of upper-outer quadrant of left breast in female, estrogen  receptor positive ([place] 05/24/2017  . PERIPHERAL NEUROPATHY 03/15/2010  . RENAL CYST 03/15/2010  . FATIGUE 03/15/2010  . NAUSEA 03/15/2010  . ABDOMINAL BLOATING 03/15/2010  . ABDOMINAL PAIN, UNSPECIFIED SITE 03/15/2010    ROtelia Limes PTA 06/06/2017, 11:06 AM  [address] [place] [address]Phone: [phone]  Fax:  [phone] Name: Monique DUDGEONMRN: [id number]  of Birth: 1956/01/09

## 2017-06-08 ENCOUNTER — Telehealth: Payer: Self-pay | Admitting: Emergency Medicine

## 2017-06-08 ENCOUNTER — Encounter: Payer: Self-pay | Admitting: Genetic Counselor

## 2017-06-08 ENCOUNTER — Telehealth: Payer: Self-pay | Admitting: Genetic Counselor

## 2017-06-08 DIAGNOSIS — Z1379 Encounter for other screening for genetic and chromosomal anomalies: Secondary | ICD-10-CM | POA: Insufficient documentation

## 2017-06-08 NOTE — Telephone Encounter (Signed)
Patient called complaining of having "horrible side effects" from the Anastrozole. Patient states joint pain, fatigue, nausea. Patient states she has been taking the anastrozole for 3 weeks now and the last week has been the worst with the side effects. Advised patient to hold the anastrozole and call the office in one week to report if side effects have subsided or not. Patient verbalized understanding.

## 2017-06-08 NOTE — Telephone Encounter (Signed)
LM on VM with good news.  Asked that she CB. 

## 2017-06-13 ENCOUNTER — Ambulatory Visit: Payer: BLUE CROSS/BLUE SHIELD

## 2017-06-13 DIAGNOSIS — R293 Abnormal posture: Secondary | ICD-10-CM | POA: Diagnosis not present

## 2017-06-13 DIAGNOSIS — M25512 Pain in left shoulder: Secondary | ICD-10-CM

## 2017-06-13 DIAGNOSIS — G8929 Other chronic pain: Secondary | ICD-10-CM | POA: Diagnosis not present

## 2017-06-13 DIAGNOSIS — C50412 Malignant neoplasm of upper-outer quadrant of left female breast: Secondary | ICD-10-CM | POA: Diagnosis not present

## 2017-06-13 DIAGNOSIS — M25612 Stiffness of left shoulder, not elsewhere classified: Secondary | ICD-10-CM | POA: Diagnosis not present

## 2017-06-13 DIAGNOSIS — Z170 Estrogen receptor positive status [ER+]: Secondary | ICD-10-CM | POA: Diagnosis not present

## 2017-06-13 NOTE — Patient Instructions (Signed)

## 2017-06-13 NOTE — Therapy (Signed)
[place], [place], [address] Phone: [phone]   Fax:  [phone]  Physical Therapy Treatment  Patient Details  Name: Monique [name] MRN: [id number] Date of Birth: Mar 04, 1956 Referring Provider: Dr. Alphonsa Overall  Encounter Date: 06/13/2017      PT End of Session - 06/13/17 1103    Visit Number 5   Number of Visits 9   Date for PT Re-Evaluation 06/21/17   PT Start Time 8206   PT Stop Time 1102   PT Time Calculation (min) 44 min   Activity Tolerance Patient tolerated treatment well   Behavior During Therapy [facility] for tasks assessed/performed      Past Medical History:  Diagnosis Date  . Abnormal EKG   . Barrett's esophagus   . Family history of breast cancer   . Family history of prostate cancer   . Family history of thyroid cancer   . Fatigue   . Generalized headaches   . IBS (irritable bowel syndrome)   . Meniere disease   . Psoriasis   . Tinnitus    left ear    Past Surgical History:  Procedure Laterality Date  . DILATION AND CURETTAGE OF UTERUS    . skin cancer removal    . TONSILLECTOMY      There were no vitals filed for this visit.      Subjective Assessment - 06/13/17 1027    Subjective I'm sorry I missed my appointment, I got sick. I can move my Lt shoulder better now and you really helped myneck last time I was here.    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 It measures 2.9 cm and is located in the upper outer quadrant. A lymph node was biopsied and was negative. Her left shoulder pain has gone untreated and now her ROM is significantly limited and impacting daily tasks.   Patient Stated Goals Learn post op shoulder ROM HEP, reduce lymphedema risk, dress, squeeze shampoo bottle, and reach for seatbelt with less difficulty.   Currently in Pain?  No/denies                         [facility] Adult PT Treatment/Exercise - 06/13/17 0001      Shoulder Exercises: Supine   Horizontal ABduction Strengthening;Both;10 reps;Theraband   Theraband Level (Shoulder Horizontal ABduction) Level 1 (Yellow)   External Rotation Strengthening;Both;10 reps;Theraband   Theraband Level (Shoulder External Rotation) Level 1 (Yellow)   Flexion Strengthening;Both;10 reps;Theraband  Narrow and Wide Grip , 10 times each   Theraband Level (Shoulder Flexion) Level 1 (Yellow)   Other Supine Exercises Bil D2 with yellow theraband 10 times each with pt returning correct therapist demonstration     Shoulder Exercises: Pulleys   Flexion 2 minutes   ABduction 2 minutes     Shoulder Exercises: Therapy Ball   Flexion 10 reps  With forward lean into end of stretch   ABduction 10 reps  Lt UE with side lean into end of strech     Manual Therapy   Manual Therapy Myofascial release;Passive ROM;Scapular mobilization   Myofascial Release gentle myofascial pulling into abduction   Scapular Mobilization In Supine during P/ROM   Passive ROM gentle PROM to left shoulder in direction of flexion, abduction and ER  PT Education - 06/13/17 1038    Education provided Yes   Education Details Supine scapular series with yellow theraband   Person(s) Educated Patient   Methods Explanation;Demonstration;Handout   Comprehension Verbalized understanding;Returned demonstration            [facility] Goals - 05/24/17 1141      CC Long Term Goal  #1   Title Patient will demonstrate a home exercise program for improving shoulder ROM.   Time 4   Period Weeks   Status New     CC Long Term Goal  #2   Title Patient will increase left shoulder flexion and abduction ROM to be >/= 150 degrees for increased ease reaching and to obtain radiation positioning.   Time 4   Period Weeks   Status New     CC Long Term Goal  #3   Title  Patient will report >/= 25% less difficulty dressing.   Time 4   Period Weeks   Status New     CC Long Term Goal  #4   Title Patient will report >/= 25% less difficulty reaching for her seatbelt with her left hand while in driver's seat.   Time 4   Period Weeks   Status New     CC Long Term Goal  #5   Title Patient will report >/= 25% less difficulty squeezing a shampoo bottle.   Time 4   Period Weeks   Status New            Plan - 06/13/17 1103    Clinical Impression Statement Pt did wel this session tolerating new postural exercises well so added these to HEP. Her P/ROM was slightly more limited today than it was at last visit as she reports being able to "feel" that she hasn't been here in a week.  She is able to report overall though noting that her A/ROM is improved since starting therapy with ADLs.   Rehab Potential Good   Clinical Impairments Affecting Rehab Potential Chronic condition   PT Frequency 2x / week   PT Duration 4 weeks   PT Treatment/Interventions ADLs/Self Care Home Management;Electrical Stimulation;Iontophoresis 18m/ml Dexamethasone;Moist Heat;Therapeutic activities;Therapeutic exercise;Patient/family education;Passive range of motion;Manual techniques;Dry needling   PT Next Visit Plan Remeasure ROM next visit; PROM to pt tolerance; scapular moblization, cont ROM exercises; review HEP issued today   PT Home Exercise Plan Post op shoulder ROM HEP, supine dowel exercises; supine scapular series with yellow theraband   Consulted and Agree with Plan of Care Patient      Patient will benefit from skilled therapeutic intervention in order to improve the following deficits and impairments:  Decreased knowledge of precautions, Decreased range of motion, Postural dysfunction, Pain, Impaired UE functional use  Visit Diagnosis: Abnormal posture  Chronic left shoulder pain  Stiffness of left shoulder, not elsewhere classified     Problem List Patient Active  Problem List   Diagnosis Date Noted  . Genetic testing 06/08/2017  . Family history of breast cancer   . Family history of prostate cancer   . Family history of thyroid cancer   . Malignant neoplasm of upper-outer quadrant of left breast in female, estrogen receptor positive ([place] 05/24/2017  . PERIPHERAL NEUROPATHY 03/15/2010  . RENAL CYST 03/15/2010  . FATIGUE 03/15/2010  . NAUSEA 03/15/2010  . ABDOMINAL BLOATING 03/15/2010  . ABDOMINAL PAIN, UNSPECIFIED SITE 03/15/2010    ROtelia Limes PTA 06/13/2017, 11:06 AM  [place], [place], [address] Phone: [phone]   Fax:  [phone]  Name: Monique [name] MRN: [id number] Date of Birth: June 09, 1956

## 2017-06-14 ENCOUNTER — Telehealth: Payer: Self-pay | Admitting: *Deleted

## 2017-06-14 NOTE — Telephone Encounter (Signed)
Pt called to update her side effects of Anastrozole.   Spoke with pt and was informed that pt had stopped Anastrozole last Wed  06/07/17 due to worsening of side effects -  Extreme fatigue, unable to eat nor drink much, joint and muscle pain, no motivation to do anything.  Stated " I feel like a 61 yr old person ". Reported that she is feeling much better after 2 - 3 days after stopping Anastrozole.   Pt wanted to know if there is something else that Dr. Lindi Adie can prescribed for pt. Pt's    Phone     [phone].

## 2017-06-14 NOTE — Telephone Encounter (Signed)
Will call pt today and follow up with her. Thank you.

## 2017-06-15 ENCOUNTER — Ambulatory Visit: Payer: BLUE CROSS/BLUE SHIELD

## 2017-06-15 DIAGNOSIS — G8929 Other chronic pain: Secondary | ICD-10-CM

## 2017-06-15 DIAGNOSIS — M25512 Pain in left shoulder: Secondary | ICD-10-CM | POA: Diagnosis not present

## 2017-06-15 DIAGNOSIS — R293 Abnormal posture: Secondary | ICD-10-CM

## 2017-06-15 DIAGNOSIS — M25612 Stiffness of left shoulder, not elsewhere classified: Secondary | ICD-10-CM | POA: Diagnosis not present

## 2017-06-15 DIAGNOSIS — C50412 Malignant neoplasm of upper-outer quadrant of left female breast: Secondary | ICD-10-CM | POA: Diagnosis not present

## 2017-06-15 DIAGNOSIS — Z170 Estrogen receptor positive status [ER+]: Secondary | ICD-10-CM | POA: Diagnosis not present

## 2017-06-15 NOTE — Therapy (Signed)
[place], [place], [address] Phone: [phone]   Fax:  [phone]  Physical Therapy Treatment  Patient Details  Name: Monique [name] MRN: [id number] Date of Birth: 10-29-1956 Referring Provider: Dr. Alphonsa Overall  Encounter Date: 06/15/2017      PT End of Session - 06/15/17 1104    Visit Number 6   Number of Visits 9   Date for PT Re-Evaluation 06/21/17   PT Start Time 1022   PT Stop Time 1104   PT Time Calculation (min) 42 min   Activity Tolerance Patient tolerated treatment well   Behavior During Therapy [facility] for tasks assessed/performed      Past Medical History:  Diagnosis Date  . Abnormal EKG   . Barrett's esophagus   . Family history of breast cancer   . Family history of prostate cancer   . Family history of thyroid cancer   . Fatigue   . Generalized headaches   . IBS (irritable bowel syndrome)   . Meniere disease   . Psoriasis   . Tinnitus    left ear    Past Surgical History:  Procedure Laterality Date  . DILATION AND CURETTAGE OF UTERUS    . skin cancer removal    . TONSILLECTOMY      There were no vitals filed for this visit.      Subjective Assessment - 06/15/17 1024    Subjective I find out when and what my surgery is tomorrow. My Lt shoulder really is doing great.    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 It measures 2.9 cm and is located in the upper outer quadrant. A lymph node was biopsied and was negative. Her left shoulder pain has gone untreated and now her ROM is significantly limited and impacting daily tasks.   Patient Stated Goals Learn post op shoulder ROM HEP, reduce lymphedema risk, dress, squeeze shampoo bottle, and reach for seatbelt with less difficulty.   Currently in Pain? No/denies            [facility] PT Assessment -  06/15/17 0001      AROM   Left Shoulder Flexion 158 Degrees   Left Shoulder ABduction 156 Degrees   Left Shoulder Internal Rotation 56 Degrees   Left Shoulder External Rotation 89 Degrees                     [facility] Adult PT Treatment/Exercise - 06/15/17 0001      Shoulder Exercises: Pulleys   Flexion 2 minutes   ABduction 2 minutes     Shoulder Exercises: Therapy Ball   Flexion 10 reps  With forward lean into end of stretch   ABduction 10 reps  Lt UE with side lean into end of stretch     Manual Therapy   Manual Therapy Myofascial release;Passive ROM;Scapular mobilization   Myofascial Release gentle myofascial pulling into abduction   Scapular Mobilization In Supine during P/ROM into protraction and retraction    Passive ROM gentle PROM to left shoulder in direction of flexion, abduction and ER                      [facility] Goals - 06/15/17 1108      CC Long Term Goal  #1   Title Patient will demonstrate a home exercise program  for improving shoulder ROM.   Status Partially Met     CC Long Term Goal  #2   Title Patient will increase left shoulder flexion and abduction ROM to be >/= 150 degrees for increased ease reaching and to obtain radiation positioning.   Baseline Lt shoulder flexion 158 and abduction 156 degress-06/15/17   Status Achieved     CC Long Term Goal  #3   Title Patient will report >/= 25% less difficulty dressing.   Baseline 80% improvement at this time-06/15/17   Status Achieved     CC Long Term Goal  #4   Title Patient will report >/= 25% less difficulty reaching for her seatbelt with her left hand while in driver's seat.   Baseline 90% improvement with this-06/15/17   Status Achieved     CC Long Term Goal  #5   Title Patient will report >/= 25% less difficulty squeezing a shampoo bottle.   Baseline Pt reports no improvement with this at this time due to weakness-06/15/17   Status On-going            Plan -  06/15/17 1104    Clinical Impression Statement Pts ROM continues to improve, especially her end P/ROM throughout stretching. She is doing well with initial HEP's and has been compliant with these. She has met 3/5 goals at this time and is progessing very well. She sees her surgeon tomorrow to find out what surgery she is having and when.    Rehab Potential Good   Clinical Impairments Affecting Rehab Potential Chronic condition   PT Frequency 2x / week   PT Duration 4 weeks   PT Treatment/Interventions ADLs/Self Care Home Management;Electrical Stimulation;Iontophoresis 4m/ml Dexamethasone;Moist Heat;Therapeutic activities;Therapeutic exercise;Patient/family education;Passive range of motion;Manual techniques;Dry needling   PT Next Visit Plan PROM to pt tolerance; scapular moblization, cont ROM exercises; review HEP issued today. See when pts surgery will be/see her until then.    Consulted and Agree with Plan of Care Patient      Patient will benefit from skilled therapeutic intervention in order to improve the following deficits and impairments:  Decreased knowledge of precautions, Decreased range of motion, Postural dysfunction, Pain, Impaired UE functional use  Visit Diagnosis: Abnormal posture  Chronic left shoulder pain  Stiffness of left shoulder, not elsewhere classified     Problem List Patient Active Problem List   Diagnosis Date Noted  . Genetic testing 06/08/2017  . Family history of breast cancer   . Family history of prostate cancer   . Family history of thyroid cancer   . Malignant neoplasm of upper-outer quadrant of left breast in female, estrogen receptor positive ([place] 05/24/2017  . PERIPHERAL NEUROPATHY 03/15/2010  . RENAL CYST 03/15/2010  . FATIGUE 03/15/2010  . NAUSEA 03/15/2010  . ABDOMINAL BLOATING 03/15/2010  . ABDOMINAL PAIN, UNSPECIFIED SITE 03/15/2010    ROtelia Limes PTA 06/15/2017, 11:16 AM  [place] [address]Phone: [phone]  Fax:  [phone] Name: [name]: [id number] of Birth: 1Apr 11, 1957

## 2017-06-16 ENCOUNTER — Telehealth: Payer: Self-pay | Admitting: Genetic Counselor

## 2017-06-16 ENCOUNTER — Ambulatory Visit: Payer: Self-pay | Admitting: Genetic Counselor

## 2017-06-16 DIAGNOSIS — Z8042 Family history of malignant neoplasm of prostate: Secondary | ICD-10-CM

## 2017-06-16 DIAGNOSIS — Z808 Family history of malignant neoplasm of other organs or systems: Secondary | ICD-10-CM

## 2017-06-16 DIAGNOSIS — Z803 Family history of malignant neoplasm of breast: Secondary | ICD-10-CM

## 2017-06-16 DIAGNOSIS — C50912 Malignant neoplasm of unspecified site of left female breast: Secondary | ICD-10-CM | POA: Diagnosis not present

## 2017-06-16 DIAGNOSIS — Z170 Estrogen receptor positive status [ER+]: Secondary | ICD-10-CM

## 2017-06-16 DIAGNOSIS — Z1379 Encounter for other screening for genetic and chromosomal anomalies: Secondary | ICD-10-CM

## 2017-06-16 DIAGNOSIS — C50412 Malignant neoplasm of upper-outer quadrant of left female breast: Secondary | ICD-10-CM

## 2017-06-16 NOTE — Progress Notes (Signed)
HPI: Monique [name] was previously seen in the [place] clinic due to a personal and family history of cancer and concerns regarding a hereditary predisposition to cancer. Please refer to our prior cancer genetics clinic note for more information regarding Monique [name]'s medical, social and family histories, and our assessment and recommendations, at the time. Monique [name] recent genetic test results were disclosed to her, as were recommendations warranted by these results. These results and recommendations are discussed in more detail below.  CANCER HISTORY:    Malignant neoplasm of upper-outer quadrant of left breast in female, estrogen receptor positive ([place])   05/12/2017 Initial Diagnosis    Palpable left breast mass with nipple inversion, mammogram revealed mass at 12:30 position 2.9 cm with suspicious lymph node in axilla. 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  The Hereditary Gene Panel offered by [facility] includes sequencing and/or deletion duplication testing of the following 46 genes: APC, ATM, AXIN2, BARD1, BMPR1A, BRCA1, BRCA2, BRIP1, CDH1, CDKN2A (p14ARF), CDKN2A (p16INK4a), CHEK2, CTNNA1, DICER1, EPCAM (Deletion/duplication testing only), GREM1 (promoter region deletion/duplication testing only), KIT, MEN1, MLH1, MSH2, MSH3, MSH6, MUTYH, NBN, NF1, NHTL1, PALB2, PDGFRA, PMS2, POLD1, POLE, PTEN, RAD50, RAD51C, RAD51D, SDHB, SDHC, SDHD, SMAD4, SMARCA4. STK11, TP53, TSC1, TSC2, and VHL.  The following genes were evaluated for sequence changes only: SDHA and HOXB13 c.251G>A variant only.  The report date is June 05, 2017.        FAMILY HISTORY:  We obtained a detailed, 4-generation family history.  Significant diagnoses are listed below: Family History    Problem Relation Age of Onset  . Heart attack Father   . Hypertension Father   . Hypertension Mother   . Healthy Maternal Aunt   . Healthy Maternal Uncle   . Breast cancer Paternal Aunt 22       bilateral  . Prostate cancer Paternal Uncle   . Healthy Paternal Aunt   . Thyroid cancer Paternal Uncle   . Healthy Paternal Uncle   . Breast cancer Cousin 87       paternal first cousin  . Breast cancer Cousin        paternal first cousin    The patient does not have children and is an only child.  Her parents are alive and well, at 56 and 57.  Her mother is the oldest of 8 children. She has six sisters and one brother, no one has cancer.  The patient's father is the youngest of 7 children.  He had one sister who had bilateral breast cancer at 52, a brother with prostate cancer and a twin brother with thyroid cancer. Two sisters who were unaffected with cancer had a daughter with breast cancer.    Monique [name] is unaware of previous family history of genetic testing for hereditary cancer risks. Patient's maternal ancestors are of [place] descent, and paternal ancestors are of [place] descent. There is no reported Ashkenazi Jewish ancestry. There is no known consanguinity.  GENETIC TEST RESULTS: Genetic testing reported out on June 05, 2017 through the Common Hereditary cancer panel found no deleterious mutations.  The Hereditary Gene Panel offered by [facility] includes sequencing and/or deletion duplication testing of the following 46 genes: APC, ATM, AXIN2, BARD1, BMPR1A, BRCA1, BRCA2, BRIP1, CDH1,  CDKN2A (p14ARF), CDKN2A (p16INK4a), CHEK2, CTNNA1, DICER1, EPCAM (Deletion/duplication testing only), GREM1 (promoter region deletion/duplication testing only), KIT, MEN1, MLH1, MSH2, MSH3, MSH6, MUTYH, NBN, NF1, NHTL1, PALB2, PDGFRA, PMS2, POLD1, POLE, PTEN, RAD50, RAD51C, RAD51D, SDHB, SDHC, SDHD, SMAD4, SMARCA4. STK11, TP53, TSC1, TSC2, and VHL.  The following genes were evaluated for sequence  changes only: SDHA and HOXB13 c.251G>A variant only. The test report has been scanned into EPIC and is located under the Molecular Pathology section of the Results Review tab.   We discussed with Monique [name] that since the current genetic testing is not perfect, it is possible there may be a gene mutation in one of these genes that current testing cannot detect, but that chance is small. We also discussed, that it is possible that another gene that has not yet been discovered, or that we have not yet tested, is responsible for the cancer diagnoses in the family, and it is, therefore, important to remain in touch with cancer genetics in the future so that we can continue to offer Monique [name] the most up to date genetic testing.   Genetic testing did detect a Variant of Unknown Significance in the ATM gene called c.2396C>T (p.Ala799Val). At this time, it is unknown if this variant is associated with increased cancer risk or if this is a normal finding, but most variants such as this get reclassified to being inconsequential. It should not be used to make medical management decisions. With time, we suspect the lab will determine the significance of this variant, if any. If we do learn more about it, we will try to contact Monique [name] to discuss it further. However, it is important to stay in touch with [place] periodically and keep the address and phone number up to date.     CANCER SCREENING RECOMMENDATIONS: *This result is reassuring and indicates that Monique [name] likely does not have an increased risk for a future cancer due to a mutation in one of these genes. This normal test also suggests that Monique [name]'s cancer was most likely not due to an inherited predisposition associated with one of these genes.  Most cancers happen by chance and this negative test suggests that her cancer falls into this category.  We, therefore, recommended she continue to follow the cancer management and screening guidelines provided  by her oncology and primary healthcare provider.   RECOMMENDATIONS FOR FAMILY MEMBERS: Women in this family might be at some increased risk of developing cancer, over the general population risk, simply due to the family history of cancer. We recommended women in this family have a yearly mammogram beginning at age 81, or 62 years younger than the earliest onset of cancer, an annual clinical breast exam, and perform monthly breast self-exams. Women in this family should also have a gynecological exam as recommended by their primary provider. All family members should have a colonoscopy by age 42.  FOLLOW-UP: Lastly, we discussed with Monique [name] that cancer genetics is a rapidly advancing field and it is possible that new genetic tests will be appropriate for her and/or her family members in the future. We encouraged her to remain in contact with cancer genetics on an annual basis so we can update her personal and family histories and let her know of advances in cancer genetics that may benefit this family.   Our contact number was provided. Monique [name] questions were answered to her satisfaction, and she knows she is welcome to call us at anytime with additional questions or concerns.  Roma Kayser, MS, [facility] Certified Genetic Counselor [email or web address]

## 2017-06-16 NOTE — Telephone Encounter (Signed)
Revealed negative genetic testing.  Discussed that we do not know why she has breast cancer or why there is cancer in the family. It could be due to a different gene that we are not testing, or maybe our current technology may not be able to pick something up.  It will be important for her to keep in contact with genetics to keep up with whether additional testing may be needed. 

## 2017-06-27 ENCOUNTER — Ambulatory Visit: Payer: BLUE CROSS/BLUE SHIELD | Attending: Surgery

## 2017-06-27 DIAGNOSIS — R293 Abnormal posture: Secondary | ICD-10-CM | POA: Diagnosis not present

## 2017-06-27 DIAGNOSIS — M25512 Pain in left shoulder: Secondary | ICD-10-CM | POA: Diagnosis not present

## 2017-06-27 DIAGNOSIS — G8929 Other chronic pain: Secondary | ICD-10-CM | POA: Diagnosis not present

## 2017-06-27 DIAGNOSIS — M25612 Stiffness of left shoulder, not elsewhere classified: Secondary | ICD-10-CM | POA: Insufficient documentation

## 2017-06-27 NOTE — Therapy (Signed)
[place], [place], [address] Phone: [phone]   Fax:  [phone]  Physical Therapy Treatment  Patient Details  Name: Monique [name] MRN: [id number] Date of Birth: [birth year] Referring Provider: Dr. Alphonsa Overall  Encounter Date: 06/27/2017      PT End of Session - 06/27/17 1107    Visit Number 7   Number of Visits 9   Date for PT Re-Evaluation 06/21/17   PT Start Time 4627   PT Stop Time 1106   PT Time Calculation (min) 43 min   Activity Tolerance Patient tolerated treatment well   Behavior During Therapy [facility] for tasks assessed/performed      Past Medical History:  Diagnosis Date  . Abnormal EKG   . Barrett's esophagus   . Family history of breast cancer   . Family history of prostate cancer   . Family history of thyroid cancer   . Fatigue   . Generalized headaches   . IBS (irritable bowel syndrome)   . Meniere disease   . Psoriasis   . Tinnitus    left ear    Past Surgical History:  Procedure Laterality Date  . DILATION AND CURETTAGE OF UTERUS    . skin cancer removal    . TONSILLECTOMY      There were no vitals filed for this visit.      Subjective Assessment - 06/27/17 1034    Subjective I spoke with my doctor and we are going to do a mastectomy. I didn't want reconstruction but my husband was trying to talk me into bc he thinks I'll be happier if I get it but I really don't think I want reconstruction.    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 It measures 2.9 cm and is located in the upper outer quadrant. A lymph node was biopsied and was negative. Her left shoulder pain has gone untreated and now her ROM is significantly limited and impacting daily tasks.   Patient Stated Goals Learn post op shoulder ROM HEP, reduce lymphedema risk, dress, squeeze  shampoo bottle, and reach for seatbelt with less difficulty.   Currently in Pain? No/denies                         [facility] Adult PT Treatment/Exercise - 06/27/17 0001      Manual Therapy   Manual Therapy Myofascial release;Passive ROM;Scapular mobilization   Myofascial Release gentle myofascial pulling into abduction   Scapular Mobilization In Supine during P/ROM into protraction and retraction    Passive ROM gentle PROM to left shoulder in direction of flexion, abduction and ER                      Breast Clinic Goals - 05/24/17 1141      Patient will be able to verbalize understanding of pertinent lymphedema risk reduction practices relevant to her diagnosis specifically related to skin care.   Time 1   Period Days   Status Achieved     Patient will be able to return demonstrate and/or verbalize understanding of the post-op home exercise program related to regaining shoulder range of motion.   Time 1   Period Days   Status Achieved     Patient will be able to verbalize understanding of the importance of attending the postoperative After  Breast Cancer Class for further lymphedema risk reduction education and therapeutic exercise.   Time 1   Period Days   Status Achieved          Long Term Clinic Goals - 06/15/17 1108      CC Long Term Goal  #1   Title Patient will demonstrate a home exercise program for improving shoulder ROM.   Status Partially Met     CC Long Term Goal  #2   Title Patient will increase left shoulder flexion and abduction ROM to be >/= 150 degrees for increased ease reaching and to obtain radiation positioning.   Baseline Lt shoulder flexion 158 and abduction 156 degress-06/15/17   Status Achieved     CC Long Term Goal  #3   Title Patient will report >/= 25% less difficulty dressing.   Baseline 80% improvement at this time-06/15/17   Status Achieved     CC Long Term Goal  #4   Title Patient will report >/= 25% less  difficulty reaching for her seatbelt with her left hand while in driver's seat.   Baseline 90% improvement with this-06/15/17   Status Achieved     CC Long Term Goal  #5   Title Patient will report >/= 25% less difficulty squeezing a shampoo bottle.   Baseline Pt reports no improvement with this at this time due to weakness-06/15/17   Status On-going            Plan - 06/27/17 1108    Clinical Impression Statement Pt has foundout she is having a mastectomy and decided she doesn't want to have reconstruction so she is going to call and have her surgery scheduled as soon as possible. She would like to be seen through this week and then D/C.    Rehab Potential Good   Clinical Impairments Affecting Rehab Potential Chronic condition   PT Frequency 2x / week   PT Duration 4 weeks   PT Treatment/Interventions ADLs/Self Care Home Management;Electrical Stimulation;Iontophoresis 97m/ml Dexamethasone;Moist Heat;Therapeutic activities;Therapeutic exercise;Patient/family education;Passive range of motion;Manual techniques;Dry needling   PT Next Visit Plan Reassess goals/ROM and D/C next visit.   Consulted and Agree with Plan of Care Patient      Patient will benefit from skilled therapeutic intervention in order to improve the following deficits and impairments:  Decreased knowledge of precautions, Decreased range of motion, Postural dysfunction, Pain, Impaired UE functional use  Visit Diagnosis: Abnormal posture  Chronic left shoulder pain  Stiffness of left shoulder, not elsewhere classified     Problem List Patient Active Problem List   Diagnosis Date Noted  . Genetic testing 06/08/2017  . Family history of breast cancer   . Family history of prostate cancer   . Family history of thyroid cancer   . Malignant neoplasm of upper-outer quadrant of left breast in female, estrogen receptor positive ([place] 05/24/2017  . PERIPHERAL NEUROPATHY 03/15/2010  . RENAL CYST 03/15/2010  . FATIGUE  03/15/2010  . NAUSEA 03/15/2010  . ABDOMINAL BLOATING 03/15/2010  . ABDOMINAL PAIN, UNSPECIFIED SITE 03/15/2010    ROtelia Limes PTA 06/27/2017, 11:09 AM  [place] [address]Phone: [phone]  Fax:  [phone] Name: RSINAHI KNIGHTSMRN: [id number] of Birth: [birth year]

## 2017-06-29 ENCOUNTER — Ambulatory Visit: Payer: BLUE CROSS/BLUE SHIELD

## 2017-06-29 DIAGNOSIS — R293 Abnormal posture: Secondary | ICD-10-CM | POA: Diagnosis not present

## 2017-06-29 DIAGNOSIS — M25612 Stiffness of left shoulder, not elsewhere classified: Secondary | ICD-10-CM | POA: Diagnosis not present

## 2017-06-29 DIAGNOSIS — G8929 Other chronic pain: Secondary | ICD-10-CM

## 2017-06-29 DIAGNOSIS — M25512 Pain in left shoulder: Secondary | ICD-10-CM | POA: Diagnosis not present

## 2017-06-29 NOTE — Therapy (Signed)
[name], [place], [address] Phone: [phone]   Fax:  [phone]  Physical Therapy Treatment  Patient Details  Name: Monique [name] MRN: [id number] Date of Birth: Apr 22, 1956 Referring Provider: Dr. Alphonsa Overall  Encounter Date: 06/29/2017      PT End of Session - 06/29/17 1107    Visit Number 8   Number of Visits 9   Date for PT Re-Evaluation 06/21/17   PT Start Time 1026   PT Stop Time 1105   PT Time Calculation (min) 39 min   Activity Tolerance Patient tolerated treatment well   Behavior During Therapy [facility] for tasks assessed/performed      Past Medical History:  Diagnosis Date  . Abnormal EKG   . Barrett's esophagus   . Family history of breast cancer   . Family history of prostate cancer   . Family history of thyroid cancer   . Fatigue   . Generalized headaches   . IBS (irritable bowel syndrome)   . Meniere disease   . Psoriasis   . Tinnitus    left ear    Past Surgical History:  Procedure Laterality Date  . DILATION AND CURETTAGE OF UTERUS    . skin cancer removal    . TONSILLECTOMY      There were no vitals filed for this visit.      Subjective Assessment - 06/29/17 1029    Subjective I was so depressed yesterday with wrestling with my decision about having reconstruction or not. I am going to meet with the plastic surgeon next week just to get more information. I still haven't decided though.    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 It measures 2.9 cm and is located in the upper outer quadrant. A lymph node was biopsied and was negative. Her left shoulder pain has gone untreated and now her ROM is significantly limited and impacting daily tasks.   Patient Stated Goals Learn post op shoulder ROM HEP, reduce lymphedema risk, dress, squeeze shampoo  bottle, and reach for seatbelt with less difficulty.   Currently in Pain? No/denies            [facility] PT Assessment - 06/29/17 0001      AROM   Left Shoulder Flexion 158 Degrees   Left Shoulder ABduction 171 Degrees   Left Shoulder Internal Rotation 61 Degrees                     [facility] Adult PT Treatment/Exercise - 06/29/17 0001      Shoulder Exercises: Pulleys   Flexion 2 minutes   ABduction 2 minutes     Shoulder Exercises: Therapy Ball   Flexion 10 reps  With forward lean into end of stretch   ABduction 10 reps  Lt UE with side lean into end of stretch     Manual Therapy   Manual Therapy Myofascial release;Passive ROM;Scapular mobilization   Myofascial Release gentle myofascial pulling into abduction   Scapular Mobilization In Supine during P/ROM into protraction and retraction    Passive ROM gentle PROM to left shoulder in direction of flexion, abduction and ER                      Breast Clinic Goals - 05/24/17 1141      Patient will be able to verbalize  understanding of pertinent lymphedema risk reduction practices relevant to her diagnosis specifically related to skin care.   Time 1   Period Days   Status Achieved     Patient will be able to return demonstrate and/or verbalize understanding of the post-op home exercise program related to regaining shoulder range of motion.   Time 1   Period Days   Status Achieved     Patient will be able to verbalize understanding of the importance of attending the postoperative After Breast Cancer Class for further lymphedema risk reduction education and therapeutic exercise.   Time 1   Period Days   Status Achieved          Long Term Clinic Goals - 06/29/17 1105      CC Long Term Goal  #1   Title Patient will demonstrate a home exercise program for improving shoulder ROM.   Status Achieved     CC Long Term Goal  #2   Title Patient will increase left shoulder flexion and abduction ROM  to be >/= 150 degrees for increased ease reaching and to obtain radiation positioning.   Baseline Lt shoulder flexion 158 and abduction 156 degress-06/15/17; Lt flexion 158 and abduction 171 degrees-06/29/17   Status Achieved     CC Long Term Goal  #3   Title Patient will report >/= 25% less difficulty dressing.   Baseline 80% improvement at this time-06/15/17   Status Achieved     CC Long Term Goal  #4   Title Patient will report >/= 25% less difficulty reaching for her seatbelt with her left hand while in driver's seat.   Baseline 90% improvement with this-06/15/17; no problem with this now, 100% improvement-06/29/17   Status Achieved     CC Long Term Goal  #5   Title Patient will report >/= 25% less difficulty squeezing a shampoo bottle.   Baseline Pt reports no improvement with this at this time due to weakness-06/15/17; 70% improvement with this now-06/29/17   Status Achieved            Plan - 06/29/17 1107    Clinical Impression Statement Pt otelrated session very well and even though her end P/ROM is still limited this is much improved and less painful. Pt will have her mastectomy soon and would like to end therapy now and be on hold until after surgery as she would like to resume shoulder therapy as this would decrease after surgery.  At this time though pt has met all goals.    Rehab Potential Good   Clinical Impairments Affecting Rehab Potential Chronic condition   PT Frequency 2x / week   PT Duration 4 weeks   PT Treatment/Interventions ADLs/Self Care Home Management;Electrical Stimulation;Iontophoresis 19m/ml Dexamethasone;Moist Heat;Therapeutic activities;Therapeutic exercise;Patient/family education;Passive range of motion;Manual techniques;Dry needling   PT Next Visit Plan Pt on hold until after surgery. PT to reassess at that time if therapy needs to resume.    Consulted and Agree with Plan of Care Patient      Patient will benefit from skilled therapeutic intervention  in order to improve the following deficits and impairments:  Decreased knowledge of precautions, Decreased range of motion, Postural dysfunction, Pain, Impaired UE functional use  Visit Diagnosis: Abnormal posture  Chronic left shoulder pain  Stiffness of left shoulder, not elsewhere classified     Problem List Patient Active Problem List   Diagnosis Date Noted  . Genetic testing 06/08/2017  . Family history of breast cancer   . Family  history of prostate cancer   . Family history of thyroid cancer   . Malignant neoplasm of upper-outer quadrant of left breast in female, estrogen receptor positive ([place]) 05/24/2017  . PERIPHERAL NEUROPATHY 03/15/2010  . RENAL CYST 03/15/2010  . FATIGUE 03/15/2010  . NAUSEA 03/15/2010  . ABDOMINAL BLOATING 03/15/2010  . ABDOMINAL PAIN, UNSPECIFIED SITE 03/15/2010    Otelia Limes, PTA 06/29/2017, 11:10 AM  [place], [place], [address] Phone: [phone]   Fax:  [phone]  Name: ELASHA TESS MRN: [id number] Date of Birth: 24-Oct-1956

## 2017-06-30 ENCOUNTER — Other Ambulatory Visit: Payer: Self-pay | Admitting: Surgery

## 2017-06-30 DIAGNOSIS — C50912 Malignant neoplasm of unspecified site of left female breast: Secondary | ICD-10-CM

## 2017-07-01 ENCOUNTER — Telehealth: Payer: Self-pay | Admitting: Hematology and Oncology

## 2017-07-01 NOTE — Telephone Encounter (Signed)
ERROR

## 2017-07-01 NOTE — Telephone Encounter (Signed)
sw pt to confirm 8/24 appt at 0930 per sch msg

## 2017-07-02 ENCOUNTER — Other Ambulatory Visit: Payer: Self-pay | Admitting: Surgery

## 2017-07-06 DIAGNOSIS — Z170 Estrogen receptor positive status [ER+]: Secondary | ICD-10-CM | POA: Diagnosis not present

## 2017-07-06 DIAGNOSIS — C50412 Malignant neoplasm of upper-outer quadrant of left female breast: Secondary | ICD-10-CM | POA: Diagnosis not present

## 2017-07-27 ENCOUNTER — Encounter (HOSPITAL_BASED_OUTPATIENT_CLINIC_OR_DEPARTMENT_OTHER): Payer: Self-pay | Admitting: *Deleted

## 2017-07-27 NOTE — Progress Notes (Signed)
Ensure pre surgery drink given with instructions to complete by [place],  Pt verbalized understanding.

## 2017-08-02 ENCOUNTER — Encounter (HOSPITAL_BASED_OUTPATIENT_CLINIC_OR_DEPARTMENT_OTHER): Payer: Self-pay | Admitting: Anesthesiology

## 2017-08-02 NOTE — Progress Notes (Signed)
Monique [name]  Location: [facility] Patient #: [id number] DOB: [birth year] Married / Language: English / Race: White Female  History of Present Illness   The patient is a 61 year old female who presents with a complaint of breast cancer.  Her PCP is Dr. Einar Pheasant  The patient was referred by Dr. Einar Pheasant  The pateint is at the Breast [facility] - Oncology is Drs. [place] and [place]. She came with her husband, Monique [name], and her parents.  MRI 05/27/2017 - shows 3.1 cancer, not other mass. This mass takes up a lot of her breast. I showed the patient and her husband the MRI images. Oncotype score is 5. She did not tolerate the anastrazole well. ... So neoadjuvant anti-hormone therapy looks like it is not going to work. She is now interested in a mastectomy. I can went over with her the comparison of lumpectomy versus mastectomy. She is also interested in reconstruction. I gave her a book on breast cancer treatment and surgery. We'll arrange for her to see a Psychiatric nurse. From the size of her tumor, and the retraction of the nipple, I don't think she is a good candidate for nipple sparing mastectomy. I'm not sure about post op radiaiton, but if her nodes remain negative, then she should not need it. She also said that her MRI was denied by her insurance company. She has not appealed this yet, but we will help her. I gave her copies of her path (again), mammogram, and MRI. I signed a note for a message.  Plan: We'll talk after she sees plastic surgery.  History of left breast cancer: She tends to treat herself and has not really had any medical care in 15+ years. The patient noticed a inversion of her left nipple over the last several months. The she felt a palpable lump in the upper outer aspect of her left breast. This prompted a mammogram. She ahs not had one in years. She said that she tends to  treat herself. Her father was one of 13 children. She has an aunt on her father's side and a cousin on her father's side who had breast cancer.  [facility] - 2.9 x 2.8 cm hypoechoic mass at the 12:30 position of the left breast. Left axillary node suspicious. Biopsy: on 05/12/2017 [phone]) - IDC, grade 2, ER - 100, PR - 95, Ki67 - 15%, Her2Neu - negative  I discussed the options for breast cancer treatment with the patient. I discussed the surgical options of lumpectomy vs. mastectomy. If mastectomy, there is the possibility of reconstruction. I discussed the options of lymph node biopsy.  The risks of surgery include, but are not limited to, bleeding, infection, the need for further surgery, and nerve injury. The patient has been given literature on the treatment of breast cancer.  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 Husband Monique [name]. She does not work - her last job was 2013   Allergies (April Staton, [place]; 06/16/2017 8:24 AM) BuPROPion HCl *CHEMICALS*  Chantix *PSYCHOTHERAPEUTIC AND NEUROLOGICAL AGENTS - MISC.*  Doxycycline *DERMATOLOGICALS*  Wellbutrin *ANTIDEPRESSANTS*   Vitals (April Staton CMA; 06/16/2017 8:26 AM) 06/16/2017 8:25 AM Weight: 111.38 lb Height: 60in Body Surface Area: 1.46 m Body Mass Index: 21.75 kg/m  Temp.: 98.54F(Oral)   Physical Exam  General: Thin tan WF alert and generally healthy appearing. Skin:  Inspection and palpation of the skin unremarkable.  Eyes: Conjunctivae white, pupils equal. Face, ears, nose, mouth, and throat: Face - normal. Normal ears and nose. Lips and teeth normal.  Neck: Supple. No mass. Trachea midline. No thyroid mass.  Lymph Nodes: No supraclavicular or cervical adenopathy. No axillary adenopathy.  Lungs: Normal respiratory effort. Clear to auscultation and symmetric breath sounds. Cardiovascular: Regular rate and rythm. Normal auscultation of the heart. No murmur or rub.  Breasts: right - No mass or nodule. Left - Inverted left nipple. Has approx 3 cm mass in the UOQ of the left breast. The mass is mobile.  Musculoskeletal/extremities: Normal gait. Good strength and ROM in upper and lower extremities.  She is doing better with her left arm since she has gone to PT.   Assessment & Plan  1.  BREAST CANCER, STAGE 2, LEFT (C50.912)  Story: [facility] - 2.9 x 2.8 cm hypoechoic mass at the 12:30 position of the left breast. Left axillary node suspicious.  Biopsy: on 05/12/2017 [phone]) - IDC, grade 2, ER - 100, PR - 95 - Ki67 - 15%, Her2Neu - negative  Oncotype - 5  Oncology - [place] [facility] [place]   Plan:   1) Plastic surgery consult   2) She wants to consider left mastectomy. I talk to her about scheduling after she sees plastic surgery.  Addendum Note(Jersee Winiarski H. Lucia Gaskins MD; 06/30/2017 6:16 PM)  It appears that she is going ahead with mastectomy (without plastic surgery consultation).  Addendum Note(Gurman [place] H. Lucia Gaskins MD; 07/02/2017 6:37 PM)  I spoke to by phone. She is sitting outside. She said that she is smoking more than normal.  She is to see plastic surgery this Thursday - but she is pretty sure she does not want to do that now.   She went to [facility] ([facility] think) and she said that she is peace with her decision - she saw camisoles, other things that she wears now.  Her mastectomy (without  reconstruction) is set for 08/03/2017.  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 She's had vertigo, but it sounds like she is doing well now 6) Hepatitis From drugs - remote She's had no long term problems   Alphonsa Overall, MD, [facility] Pager: [phone] Office phone:  [phone]

## 2017-08-02 NOTE — Anesthesia Preprocedure Evaluation (Addendum)
Anesthesia Evaluation  Patient identified by MRN, date of birth, ID band Patient awake    Reviewed: Allergy & Precautions, NPO status , reviewed documented beta blocker date and time   History of Anesthesia Complications (+) PONV  Airway Mallampati: I       Dental no notable dental hx.    Pulmonary neg pulmonary ROS,    Pulmonary exam normal breath sounds clear to auscultation       Cardiovascular Normal cardiovascular exam Rhythm:Regular Rate:Normal     Neuro/Psych negative psych ROS   GI/Hepatic negative GI ROS, Neg liver ROS,   Endo/Other    Renal/GU   negative genitourinary   Musculoskeletal negative musculoskeletal ROS (+)   Abdominal Normal abdominal exam  (+)   Peds  Hematology negative hematology ROS (+)   Anesthesia Other Findings   Reproductive/Obstetrics negative OB ROS                            Anesthesia Physical Anesthesia Plan  ASA: II  Anesthesia Plan: General   Post-op Pain Management:  Regional for Post-op pain   Induction: Intravenous  PONV Risk Score and Plan: 4 or greater and Ondansetron, Dexamethasone, Midazolam, Scopolamine patch - Pre-op and Propofol infusion  Airway Management Planned: LMA  Additional Equipment:   Intra-op Plan:   Post-operative Plan:   Informed Consent: I have reviewed the patients History and Physical, chart, labs and discussed the procedure including the risks, benefits and alternatives for the proposed anesthesia with the patient or authorized representative who has indicated his/her understanding and acceptance.     Plan Discussed with: CRNA and Surgeon  Anesthesia Plan Comments:        Anesthesia Quick Evaluation

## 2017-08-03 ENCOUNTER — Ambulatory Visit (HOSPITAL_BASED_OUTPATIENT_CLINIC_OR_DEPARTMENT_OTHER): Payer: BLUE CROSS/BLUE SHIELD | Admitting: Anesthesiology

## 2017-08-03 ENCOUNTER — Ambulatory Visit (HOSPITAL_BASED_OUTPATIENT_CLINIC_OR_DEPARTMENT_OTHER)
Admission: RE | Admit: 2017-08-03 | Discharge: 2017-08-04 | Disposition: A | Discharge status: Home / Self Care | Payer: BLUE CROSS/BLUE SHIELD | Source: Ambulatory Visit | Attending: Surgery | Admitting: Surgery

## 2017-08-03 ENCOUNTER — Encounter (HOSPITAL_BASED_OUTPATIENT_CLINIC_OR_DEPARTMENT_OTHER): Payer: Self-pay | Admitting: Anesthesiology

## 2017-08-03 ENCOUNTER — Ambulatory Visit (HOSPITAL_COMMUNITY)
Admission: RE | Admit: 2017-08-03 | Discharge: 2017-08-03 | Disposition: A | Discharge status: Home / Self Care | Payer: BLUE CROSS/BLUE SHIELD | Source: Ambulatory Visit | Attending: Surgery | Admitting: Surgery

## 2017-08-03 ENCOUNTER — Encounter (HOSPITAL_BASED_OUTPATIENT_CLINIC_OR_DEPARTMENT_OTHER)
Admission: RE | Disposition: A | Discharge status: Home / Self Care | Payer: Self-pay | Source: Ambulatory Visit | Attending: Surgery

## 2017-08-03 DIAGNOSIS — C50412 Malignant neoplasm of upper-outer quadrant of left female breast: Secondary | ICD-10-CM | POA: Diagnosis not present

## 2017-08-03 DIAGNOSIS — Z170 Estrogen receptor positive status [ER+]: Secondary | ICD-10-CM | POA: Insufficient documentation

## 2017-08-03 DIAGNOSIS — Z888 Allergy status to other drugs, medicaments and biological substances status: Secondary | ICD-10-CM | POA: Insufficient documentation

## 2017-08-03 DIAGNOSIS — Z885 Allergy status to narcotic agent status: Secondary | ICD-10-CM | POA: Insufficient documentation

## 2017-08-03 DIAGNOSIS — Z79899 Other long term (current) drug therapy: Secondary | ICD-10-CM | POA: Diagnosis not present

## 2017-08-03 DIAGNOSIS — R110 Nausea: Secondary | ICD-10-CM | POA: Diagnosis not present

## 2017-08-03 DIAGNOSIS — Z79891 Long term (current) use of opiate analgesic: Secondary | ICD-10-CM | POA: Diagnosis not present

## 2017-08-03 DIAGNOSIS — Z853 Personal history of malignant neoplasm of breast: Secondary | ICD-10-CM | POA: Diagnosis not present

## 2017-08-03 DIAGNOSIS — G8918 Other acute postprocedural pain: Secondary | ICD-10-CM | POA: Diagnosis not present

## 2017-08-03 DIAGNOSIS — F17210 Nicotine dependence, cigarettes, uncomplicated: Secondary | ICD-10-CM | POA: Diagnosis not present

## 2017-08-03 DIAGNOSIS — C50912 Malignant neoplasm of unspecified site of left female breast: Secondary | ICD-10-CM | POA: Diagnosis not present

## 2017-08-03 HISTORY — DX: Other specified postprocedural states: Z98.890

## 2017-08-03 HISTORY — DX: Adverse effect of unspecified anesthetic, initial encounter: T41.45XA

## 2017-08-03 HISTORY — PX: MASTECTOMY W/ SENTINEL NODE BIOPSY: SHX2001

## 2017-08-03 HISTORY — DX: Other specified postprocedural states: R11.2

## 2017-08-03 HISTORY — DX: Other complications of anesthesia, initial encounter: T88.59XA

## 2017-08-03 SURGERY — MASTECTOMY WITH SENTINEL LYMPH NODE BIOPSY
Anesthesia: General | Site: Breast | Laterality: Left

## 2017-08-03 MED ORDER — HYDROMORPHONE HCL 1 MG/ML IJ SOLN
0.2500 mg | INTRAMUSCULAR | Status: DC | PRN
  Administered 2017-08-03 (×2): 0.5 mg via INTRAVENOUS

## 2017-08-03 MED ORDER — LACTATED RINGERS IV SOLN
INTRAVENOUS | Status: DC
  Administered 2017-08-03 (×2): via INTRAVENOUS

## 2017-08-03 MED ORDER — MEPERIDINE HCL 25 MG/ML IJ SOLN: 6.2500 mg | INTRAMUSCULAR | Status: DC | PRN

## 2017-08-03 MED ORDER — ACETAMINOPHEN 500 MG PO TABS
1000.0000 mg | ORAL_TABLET | ORAL | Status: AC
  Administered 2017-08-03: 1000 mg via ORAL

## 2017-08-03 MED ORDER — KCL IN DEXTROSE-NACL 20-5-0.45 MEQ/L-%-% IV SOLN
INTRAVENOUS | Status: DC
  Administered 2017-08-03: 11:00:00 via INTRAVENOUS
  Filled 2017-08-03: qty 1000

## 2017-08-03 MED ORDER — GABAPENTIN 300 MG PO CAPS
ORAL_CAPSULE | ORAL | Status: AC
Start: 2017-08-03 — End: 2017-08-03
  Filled 2017-08-03: qty 1

## 2017-08-03 MED ORDER — KETOROLAC TROMETHAMINE 30 MG/ML IJ SOLN
30.0000 mg | Freq: Once | INTRAMUSCULAR | Status: DC | PRN
  Administered 2017-08-03: 30 mg via INTRAVENOUS
  Filled 2017-08-03: qty 1

## 2017-08-03 MED ORDER — HYDROMORPHONE HCL 1 MG/ML IJ SOLN
INTRAMUSCULAR | Status: AC
  Filled 2017-08-03: qty 0.5

## 2017-08-03 MED ORDER — METHYLENE BLUE 0.5 % INJ SOLN
INTRAVENOUS | Status: AC
  Filled 2017-08-03: qty 10

## 2017-08-03 MED ORDER — MIDAZOLAM HCL 2 MG/2ML IJ SOLN
1.0000 mg | INTRAMUSCULAR | Status: DC | PRN
  Administered 2017-08-03 (×2): 1 mg via INTRAVENOUS

## 2017-08-03 MED ORDER — LIDOCAINE 2% (20 MG/ML) 5 ML SYRINGE
INTRAMUSCULAR | Status: DC | PRN
  Administered 2017-08-03: 100 mg via INTRAVENOUS

## 2017-08-03 MED ORDER — ONDANSETRON 4 MG PO TBDP
4.0000 mg | ORAL_TABLET | Freq: Four times a day (QID) | ORAL | Status: DC | PRN
  Administered 2017-08-03: 4 mg via ORAL
  Filled 2017-08-03: qty 1

## 2017-08-03 MED ORDER — BUPIVACAINE-EPINEPHRINE (PF) 0.5% -1:200000 IJ SOLN
INTRAMUSCULAR | Status: DC | PRN
  Administered 2017-08-03 (×3): 10 mL

## 2017-08-03 MED ORDER — MORPHINE SULFATE (PF) 2 MG/ML IV SOLN
1.0000 mg | INTRAVENOUS | Status: DC | PRN
Start: 2017-08-03 — End: 2017-08-04

## 2017-08-03 MED ORDER — DEXAMETHASONE SODIUM PHOSPHATE 4 MG/ML IJ SOLN
INTRAMUSCULAR | Status: DC | PRN
  Administered 2017-08-03: 10 mg via INTRAVENOUS

## 2017-08-03 MED ORDER — ONDANSETRON HCL 4 MG/2ML IJ SOLN
INTRAMUSCULAR | Status: DC | PRN
  Administered 2017-08-03: 4 mg via INTRAVENOUS

## 2017-08-03 MED ORDER — FENTANYL CITRATE (PF) 100 MCG/2ML IJ SOLN
INTRAMUSCULAR | Status: AC
  Filled 2017-08-03: qty 2

## 2017-08-03 MED ORDER — GLYCOPYRROLATE 0.2 MG/ML IV SOSY
PREFILLED_SYRINGE | INTRAVENOUS | Status: DC | PRN
  Administered 2017-08-03: .2 mg via INTRAVENOUS

## 2017-08-03 MED ORDER — PROMETHAZINE HCL 25 MG/ML IJ SOLN: 6.2500 mg | INTRAMUSCULAR | Status: DC | PRN

## 2017-08-03 MED ORDER — TECHNETIUM TC 99M SULFUR COLLOID FILTERED
1.0000 | Freq: Once | INTRAVENOUS | Status: AC | PRN
  Administered 2017-08-03: 1 via INTRADERMAL

## 2017-08-03 MED ORDER — SODIUM CHLORIDE 0.9 % IJ SOLN
INTRAMUSCULAR | Status: AC
  Filled 2017-08-03: qty 10

## 2017-08-03 MED ORDER — HYDROCODONE-ACETAMINOPHEN 5-325 MG PO TABS
1.0000 | ORAL_TABLET | Freq: Four times a day (QID) | ORAL | Status: DC | PRN
  Administered 2017-08-03: 1 via ORAL
  Filled 2017-08-03: qty 1

## 2017-08-03 MED ORDER — SCOPOLAMINE 1 MG/3DAYS TD PT72
1.0000 | MEDICATED_PATCH | Freq: Once | TRANSDERMAL | Status: AC | PRN
  Administered 2017-08-03: 1 via TRANSDERMAL

## 2017-08-03 MED ORDER — ONDANSETRON HCL 4 MG/2ML IJ SOLN
INTRAMUSCULAR | Status: AC
  Filled 2017-08-03: qty 2

## 2017-08-03 MED ORDER — HEPARIN SODIUM (PORCINE) 5000 UNIT/ML IJ SOLN
INTRAMUSCULAR | Status: AC
Start: 2017-08-03 — End: 2017-08-03
  Filled 2017-08-03: qty 1

## 2017-08-03 MED ORDER — ONDANSETRON HCL 4 MG/2ML IJ SOLN: 4.0000 mg | Freq: Four times a day (QID) | INTRAMUSCULAR | Status: DC | PRN

## 2017-08-03 MED ORDER — PHENYLEPHRINE 40 MCG/ML (10ML) SYRINGE FOR IV PUSH (FOR BLOOD PRESSURE SUPPORT)
PREFILLED_SYRINGE | INTRAVENOUS | Status: DC | PRN
  Administered 2017-08-03 (×2): 80 ug via INTRAVENOUS

## 2017-08-03 MED ORDER — ALPRAZOLAM 0.5 MG PO TABS: 0.5000 mg | ORAL_TABLET | Freq: Every evening | ORAL | Status: DC | PRN

## 2017-08-03 MED ORDER — CHLORHEXIDINE GLUCONATE CLOTH 2 % EX PADS: 6.0000 | MEDICATED_PAD | Freq: Once | CUTANEOUS | Status: DC

## 2017-08-03 MED ORDER — PROPOFOL 500 MG/50ML IV EMUL
INTRAVENOUS | Status: AC
Start: 2017-08-03 — End: 2017-08-03
  Filled 2017-08-03: qty 50

## 2017-08-03 MED ORDER — PHENYLEPHRINE 40 MCG/ML (10ML) SYRINGE FOR IV PUSH (FOR BLOOD PRESSURE SUPPORT)
PREFILLED_SYRINGE | INTRAVENOUS | Status: AC
  Filled 2017-08-03: qty 10

## 2017-08-03 MED ORDER — GABAPENTIN 300 MG PO CAPS
300.0000 mg | ORAL_CAPSULE | Freq: Two times a day (BID) | ORAL | Status: DC
  Administered 2017-08-03: 300 mg via ORAL

## 2017-08-03 MED ORDER — DEXAMETHASONE SODIUM PHOSPHATE 10 MG/ML IJ SOLN
INTRAMUSCULAR | Status: AC
  Filled 2017-08-03: qty 1

## 2017-08-03 MED ORDER — HEPARIN SODIUM (PORCINE) 5000 UNIT/ML IJ SOLN
5000.0000 [IU] | Freq: Three times a day (TID) | INTRAMUSCULAR | Status: DC
  Administered 2017-08-03: 5000 [IU] via SUBCUTANEOUS

## 2017-08-03 MED ORDER — ACETAMINOPHEN 500 MG PO TABS
ORAL_TABLET | ORAL | Status: AC
  Filled 2017-08-03: qty 2

## 2017-08-03 MED ORDER — FENTANYL CITRATE (PF) 100 MCG/2ML IJ SOLN
50.0000 ug | INTRAMUSCULAR | Status: AC | PRN
  Administered 2017-08-03: 25 ug via INTRAVENOUS
  Administered 2017-08-03 (×3): 50 ug via INTRAVENOUS
  Administered 2017-08-03: 25 ug via INTRAVENOUS

## 2017-08-03 MED ORDER — PROPOFOL 10 MG/ML IV BOLUS
INTRAVENOUS | Status: DC | PRN
Start: 2017-08-03 — End: 2017-08-03
  Administered 2017-08-03: 100 mg via INTRAVENOUS

## 2017-08-03 MED ORDER — GABAPENTIN 300 MG PO CAPS
ORAL_CAPSULE | ORAL | Status: AC
  Filled 2017-08-03: qty 1

## 2017-08-03 MED ORDER — CEFAZOLIN SODIUM-DEXTROSE 2-4 GM/100ML-% IV SOLN
2.0000 g | INTRAVENOUS | Status: AC
  Administered 2017-08-03: 2 g via INTRAVENOUS

## 2017-08-03 MED ORDER — SCOPOLAMINE 1 MG/3DAYS TD PT72
MEDICATED_PATCH | TRANSDERMAL | Status: AC
  Filled 2017-08-03: qty 1

## 2017-08-03 MED ORDER — GABAPENTIN 300 MG PO CAPS
300.0000 mg | ORAL_CAPSULE | ORAL | Status: AC
  Administered 2017-08-03: 300 mg via ORAL

## 2017-08-03 MED ORDER — 0.9 % SODIUM CHLORIDE (POUR BTL) OPTIME
TOPICAL | Status: DC | PRN
  Administered 2017-08-03: 1000 mL

## 2017-08-03 MED ORDER — TRAMADOL HCL 50 MG PO TABS
50.0000 mg | ORAL_TABLET | Freq: Four times a day (QID) | ORAL | Status: DC | PRN
  Administered 2017-08-03 – 2017-08-04 (×3): 50 mg via ORAL
  Filled 2017-08-03 (×3): qty 1

## 2017-08-03 MED ORDER — CEFAZOLIN SODIUM-DEXTROSE 2-4 GM/100ML-% IV SOLN
INTRAVENOUS | Status: AC
  Filled 2017-08-03: qty 100

## 2017-08-03 MED ORDER — MIDAZOLAM HCL 2 MG/2ML IJ SOLN
INTRAMUSCULAR | Status: AC
  Filled 2017-08-03: qty 2

## 2017-08-03 MED ORDER — LIDOCAINE 2% (20 MG/ML) 5 ML SYRINGE
INTRAMUSCULAR | Status: AC
  Filled 2017-08-03: qty 5

## 2017-08-03 SURGICAL SUPPLY — 47 items
ADH SKN CLS APL DERMABOND .7 (GAUZE/BANDAGES/DRESSINGS) ×1
APPLIER CLIP 11 MED OPEN (CLIP) ×2
APR CLP MED 11 20 MLT OPN (CLIP) ×1
BINDER BREAST MEDIUM (GAUZE/BANDAGES/DRESSINGS) ×1 IMPLANT
BIOPATCH RED 1 DISK 7.0 (GAUZE/BANDAGES/DRESSINGS) ×2 IMPLANT
BLADE SURG 10 STRL SS (BLADE) ×2 IMPLANT
BLADE SURG 15 STRL LF DISP TIS (BLADE) ×1 IMPLANT
BLADE SURG 15 STRL SS (BLADE) ×2
CANISTER SUCT 1200ML W/VALVE (MISCELLANEOUS) ×2 IMPLANT
CHLORAPREP W/TINT 26ML (MISCELLANEOUS) ×2 IMPLANT
CLIP APPLIE 11 MED OPEN (CLIP) IMPLANT
COVER BACK TABLE 60X90IN (DRAPES) ×2 IMPLANT
COVER MAYO STAND STRL (DRAPES) ×2 IMPLANT
COVER PROBE W GEL 5X96 (DRAPES) ×2 IMPLANT
DERMABOND ADVANCED (GAUZE/BANDAGES/DRESSINGS) ×1
DERMABOND ADVANCED .7 DNX12 (GAUZE/BANDAGES/DRESSINGS) ×1 IMPLANT
DRAIN CHANNEL 19F RND (DRAIN) ×2 IMPLANT
DRAPE LAPAROSCOPIC ABDOMINAL (DRAPES) ×2 IMPLANT
DRAPE UTILITY XL STRL (DRAPES) ×2 IMPLANT
DRSG PAD ABDOMINAL 8X10 ST (GAUZE/BANDAGES/DRESSINGS) ×2 IMPLANT
ELECT COATED BLADE 2.86 ST (ELECTRODE) ×2 IMPLANT
ELECT REM PT RETURN 9FT ADLT (ELECTROSURGICAL) ×2
ELECTRODE REM PT RTRN 9FT ADLT (ELECTROSURGICAL) ×1 IMPLANT
EVACUATOR SILICONE 100CC (DRAIN) ×2 IMPLANT
FILTER 7/8 IN (FILTER) ×2 IMPLANT
GAUZE SPONGE 4X4 12PLY STRL (GAUZE/BANDAGES/DRESSINGS) ×2 IMPLANT
GLOVE SURG SIGNA 7.5 PF LTX (GLOVE) ×2 IMPLANT
GOWN STRL REUS W/ TWL LRG LVL3 (GOWN DISPOSABLE) ×2 IMPLANT
GOWN STRL REUS W/TWL LRG LVL3 (GOWN DISPOSABLE) ×4
NDL HYPO 25X1 1.5 SAFETY (NEEDLE) ×1 IMPLANT
NEEDLE HYPO 25X1 1.5 SAFETY (NEEDLE) ×2 IMPLANT
NS IRRIG 1000ML POUR BTL (IV SOLUTION) ×2 IMPLANT
PACK BASIN DAY SURGERY FS (CUSTOM PROCEDURE TRAY) ×2 IMPLANT
PIN SAFETY STERILE (MISCELLANEOUS) ×2 IMPLANT
SHEET MEDIUM DRAPE 40X70 STRL (DRAPES) ×2 IMPLANT
SLEEVE SCD COMPRESS KNEE MED (MISCELLANEOUS) ×2 IMPLANT
SPONGE LAP 18X18 X RAY DECT (DISPOSABLE) ×2 IMPLANT
SUT ETHILON 2 0 FS 18 (SUTURE) ×2 IMPLANT
SUT MNCRL AB 4-0 PS2 18 (SUTURE) ×2 IMPLANT
SUT SILK 2 0 SH (SUTURE) ×2 IMPLANT
SUT VICRYL 3-0 CR8 SH (SUTURE) ×3 IMPLANT
TAPE CLOTH SURG 6X10 WHT LF (GAUZE/BANDAGES/DRESSINGS) ×1 IMPLANT
TOWEL OR 17X24 6PK STRL BLUE (TOWEL DISPOSABLE) ×2 IMPLANT
TOWEL OR NON WOVEN STRL DISP B (DISPOSABLE) ×2 IMPLANT
TUBE CONNECTING 20X1/4 (TUBING) ×2 IMPLANT
VAC PENCILS W/TUBING CLEAR (MISCELLANEOUS) ×2 IMPLANT
YANKAUER SUCT BULB TIP NO VENT (SUCTIONS) ×2 IMPLANT

## 2017-08-03 NOTE — Anesthesia Postprocedure Evaluation (Signed)
Anesthesia Post Note  Patient: Monique [name]  Procedure(s) Performed: Procedure(s) (LRB): LEFT MASTECTOMY WITH LEFT AXILLARY SENTINEL LYMPH NODE BIOPSY (Left)     Patient location during evaluation: PACU Anesthesia Type: General Level of consciousness: awake and sedated Pain management: pain level controlled Vital Signs Assessment: post-procedure vital signs reviewed and stable Respiratory status: spontaneous breathing Cardiovascular status: stable Postop Assessment: no signs of nausea or vomiting Anesthetic complications: no    Last Vitals:  Vitals:   08/03/17 0945 08/03/17 1000  BP: (!) 150/64 140/68  Pulse: 89 70  Resp: 16 (!) 9  Temp:    SpO2: 96% 100%    Last Pain:  Vitals:   08/03/17 1000  TempSrc:   PainSc: 5    Pain Goal: Patients Stated Pain Goal: 3 (08/03/17 0649)               Nicholle Falzon JR,JOHN Mateo Flow

## 2017-08-03 NOTE — Interval H&P Note (Signed)
History and Physical Interval Note:  08/03/2017 7:17 AM  Monique [name]  has presented today for surgery, with the diagnosis of LEFT BREAST CANCER  The various methods of treatment have been discussed with the patient and family.  She is ready for surgery.  After consideration of risks, benefits and other options for treatment, the patient has consented to  Procedure(s): LEFT MASTECTOMY WITH LEFT AXILLARY SENTINEL LYMPH NODE BIOPSY (Left) as a surgical intervention .  The patient's history has been reviewed, patient examined, no change in status, stable for surgery.  I have reviewed the patient's chart and labs.  Questions were answered to the patient's satisfaction.     Shaymus Eveleth H

## 2017-08-03 NOTE — Anesthesia Procedure Notes (Addendum)
Anesthesia Regional Block: Pectoralis block   Pre-Anesthetic Checklist: ,, timeout performed, Correct Patient, Correct Site, Correct Laterality, Correct Procedure, Correct Position, site marked, Risks and benefits discussed,  Surgical consent,  Pre-op evaluation,  At surgeon's request and post-op pain management  Laterality: Left and Upper  Prep: chloraprep       Needles:  Injection technique: Single-shot  Needle Type: Echogenic Stimulator Needle     Needle Length: 5cm  Needle Gauge: 21   Needle insertion depth: 2 cm   Additional Needles:   Procedures: ultrasound guided,,,,,,,,  Narrative:  Start time: 08/03/2017 7:13 AM End time: 08/03/2017 7:22 AM Injection made incrementally with aspirations every 5 mL.  Performed by: Personally  Anesthesiologist: Lyn Hollingshead

## 2017-08-03 NOTE — Progress Notes (Signed)
Assisted Dr. Jillyn Hidden with left, ultrasound guided, pectoralis block. Side rails up, monitors on throughout procedure. See vital signs in flow sheet. Tolerated Procedure well.

## 2017-08-03 NOTE — H&P (View-Only) (Signed)
Monique [name]  Location: [facility] Surgery Patient #: [id number] DOB: 1956-03-25 Married / Language: English / Race: White Female  History of Present Illness   The patient is a 61 year old female who presents with a complaint of breast cancer.  Her PCP is Dr. Dale [place]  The patient was referred by Dr. Dale Moore  The pateint is at the [facility] Oncology is Drs. [place] and [employer]. She came with her husband, Deniece Portela, and her parents.  MRI 05/27/2017 - shows 3.1 cancer, not other mass. This mass takes up a lot of her breast. I showed the patient and her husband the MRI images. Oncotype score is 5. She did not tolerate the anastrazole well. ... So neoadjuvant anti-hormone therapy looks like it is not going to work. She is now interested in a mastectomy. I can went over with her the comparison of lumpectomy versus mastectomy. She is also interested in reconstruction. I gave her a book on breast cancer treatment and surgery. We'll arrange for her to see a Engineer, petroleum. From the size of her tumor, and the retraction of the nipple, I don't think she is a good candidate for nipple sparing mastectomy. I'm not sure about post op radiaiton, but if her nodes remain negative, then she should not need it. She also said that her MRI was denied by her insurance company. She has not appealed this yet, but we will help her. I gave her copies of her path (again), mammogram, and MRI. I signed a note for a message.  Plan: We'll talk after she sees plastic surgery.  History of left breast cancer: She tends to treat herself and has not really had any medical care in 15+ years. The patient noticed a inversion of her left nipple over the last several months. The she felt a palpable lump in the upper outer aspect of her left breast. This prompted a mammogram. She ahs not had one in years. She said that she tends to  treat herself. Her father was one of 13 children. She has an aunt on her father's side and a cousin on her father's side who had breast cancer.  [facility] - 2.9 x 2.8 cm hypoechoic mass at the 12:30 position of the left breast. Left axillary node suspicious. Biopsy: on 05/12/2017 [id number]) - IDC, grade 2, ER - 100, PR - 95, Ki67 - 15%, Her2Neu - negative  I discussed the options for breast cancer treatment with the patient. I discussed the surgical options of lumpectomy vs. mastectomy. If mastectomy, there is the possibility of reconstruction. I discussed the options of lymph node biopsy.  The risks of surgery include, but are not limited to, bleeding, infection, the need for further surgery, and nerve injury. The patient has been given literature on the treatment of breast cancer.  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 Husband Trent. She does not work - her last job was 2013   Allergies (April Staton, CMA; 06/16/2017 8:24 AM) BuPROPion HCl *CHEMICALS*  Chantix *PSYCHOTHERAPEUTIC AND NEUROLOGICAL AGENTS - MISC.*  Doxycycline *DERMATOLOGICALS*  Wellbutrin *ANTIDEPRESSANTS*   Vitals (April Staton CMA; 06/16/2017 8:26 AM) 06/16/2017 8:25 AM Weight: 111.38 lb Height: 60in Body Surface Area: 1.46 m Body Mass Index: 21.75 kg/m  Temp.: 98.54F(Oral)   Physical Exam  General: Thin tan WF alert and generally healthy appearing. Skin:  Inspection and palpation of the skin unremarkable.  Eyes: Conjunctivae white, pupils equal. Face, ears, nose, mouth, and throat: Face - normal. Normal ears and nose. Lips and teeth normal.  Neck: Supple. No mass. Trachea midline. No thyroid mass.  Lymph Nodes: No supraclavicular or cervical adenopathy. No axillary adenopathy.  Lungs: Normal respiratory effort. Clear to auscultation and symmetric breath sounds. Cardiovascular: Regular rate and rythm. Normal auscultation of the heart. No murmur or rub.  Breasts: right - No mass or nodule. Left - Inverted left nipple. Has approx 3 cm mass in the UOQ of the left breast. The mass is mobile.  Musculoskeletal/extremities: Normal gait. Good strength and ROM in upper and lower extremities.  She is doing better with her left arm since she has gone to PT.   Assessment & Plan  1.  BREAST CANCER, STAGE 2, LEFT (C50.912)  Story: [facility] - 2.9 x 2.8 cm hypoechoic mass at the 12:30 position of the left breast. Left axillary node suspicious.  Biopsy: on 05/12/2017 [id number]) - IDC, grade 2, ER - 100, PR - 95 - Ki67 - 15%, Her2Neu - negative  Oncotype - 5  Oncology - [place] [facility] [place]   Plan:   1) Plastic surgery consult   2) She wants to consider left mastectomy. I talk to her about scheduling after she sees plastic surgery.  Addendum Note(Lexington Devine H. Lucia Gaskins MD; 06/30/2017 6:16 PM)  It appears that she is going ahead with mastectomy (without plastic surgery consultation).  Addendum Note(Jacalynn Buzzell H. Lucia Gaskins MD; 07/02/2017 6:37 PM)  I spoke to by phone. She is sitting outside. She said that she is smoking more than normal.  She is to see plastic surgery this Thursday - but she is pretty sure she does not want to do that now.   She went to [facility] ([facility] think) and she said that she is peace with her decision - she saw camisoles, other things that she wears now.  Her mastectomy (without  reconstruction) is set for 08/03/2017.  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 She's had vertigo, but it sounds like she is doing well now 6) Hepatitis From drugs - remote She's had no long term problems   Alphonsa Overall, MD, [facility] Surgery Pager: [phone] Office phone:  [phone]

## 2017-08-03 NOTE — Op Note (Addendum)
08/03/2017  9:26 AM  PATIENT:  Monique [name], 61 y.o., female, MRN: [id number]  PREOP DIAGNOSIS:  LEFT BREAST CANCER  POSTOP DIAGNOSIS:   Left breast cancer, 1 o'clock position (T2, N0)  PROCEDURE:   Procedure(s): LEFT MASTECTOMY WITH LEFT AXILLARY SENTINEL LYMPH NODE BIOPSY, deep sentinel lymph node biopsy  SURGEON:   Alphonsa Overall, M.D.  ASSISTANT:   None  ANESTHESIA:   general  Anesthesiologist: Lyn Hollingshead, MD CRNA: Maryella Shivers, CRNA; Lyndee Leo, CRNA  General  ASA:  2  EBL:  <100  ml  BLOOD ADMINISTERED: none  DRAINS: none   LOCAL MEDICATIONS USED:   Left pectoral block by anesthesia  SPECIMEN:   Left breast (suture marks lateral breast), left axillary sentinel lymph node (counts 1500, background 10)  COUNTS CORRECT:  YES  INDICATIONS FOR PROCEDURE:  Monique [name] is a 61 y.o. (DOB: [birth year]) white female whose primary care physician is Janyth Pupa, DO and comes for left mastectomy with left axillary sentinel lymph node.   Ms. Ham was seen in the [facility] [place] with Drs. [place] and [place].  She had a 3.1 cm IDC of the left breast.  Her Oncotype score is 5.  She tried anastrazole as a neoadjuvant treatment to try to shrink the tumor, but she did not tolerate this well.  So she decided to have a left mastectomy.  We talked about reconstruction, but she declined this. The indications and risks of the surgery were explained to the patient.  The risks include, but are not limited to, infection, bleeding, and nerve injury.   In the pre op area, she had a left pectoral block by anesthesia.  In the holding area, her left areola was injected with 1 millicurie of Technitium Sulfur Colloid.  OPERATIVE NOTE;  The patient was taken to room # 8 at [facility] where she underwent a general anesthesia  supervised by Anesthesiologist: Lyn Hollingshead, MD CRNA: Maryella Shivers, CRNA; Lyndee Leo, CRNA. Her left breast and axilla were prepped with  ChloraPrep and sterilely draped.    A time-out and the surgical check list was reviewed.    I made an elliptical incision including the areola in the left breast.  I developed skin flaps medially to the lateral edge of the sternum, inferiorly to the investing fascia of the rectus abdominus muscle, laterally to the anterior edge of the latissimus dorsi muscle, and superiorly to about 2 finger breaths below the clavicle.  The breast was reflected off the pectoralis muscle from medial to lateral.  The lateral attachments in the left axilla were divided and the breast removed.  A long suture was placed on the lateral aspect of the breast.  There was no obvious tumor at any margin.   I dissected into the left axilla and found a deep sentinel lymph node.  The node had counts of 1500 with a background count of 10.  The lymph node was blue.  This was sent as a separate specimen.   I brought out one 50 F Blake drain below the inferior flaps.  This was sewn in place with 2-0 Nylons.  I irrigated the wound with 1,000 cc of fluid.   The skin was closed with interrupted 3-0 Vicryl sutures and the skin was closed with a 4-0 Monocryl.  The wound was painted with Dermabond.    A pressure dressing was placed on the wound and the chest wrapped with a breast binder.  Her needle and sponge  count were correct at the end of the case.   She was transferred to the recovery room in good condition.  Alphonsa Overall, MD, [facility] Surgery Pager: [phone] Office phone:  [phone]

## 2017-08-03 NOTE — Anesthesia Procedure Notes (Signed)
Procedure Name: LMA Insertion Date/Time: 08/03/2017 7:53 AM Performed by: Lyndee Leo Pre-anesthesia Checklist: Patient identified, Emergency Drugs available, Suction available and Patient being monitored Patient Re-evaluated:Patient Re-evaluated prior to induction Oxygen Delivery Method: Circle system utilized Preoxygenation: Pre-oxygenation with 100% oxygen Induction Type: IV induction Ventilation: Mask ventilation without difficulty LMA: LMA inserted LMA Size: 3.0 Number of attempts: 1 Airway Equipment and Method: Bite block Placement Confirmation: positive ETCO2 Tube secured with: Tape Dental Injury: Teeth and Oropharynx as per pre-operative assessment

## 2017-08-03 NOTE — Transfer of Care (Signed)
Immediate Anesthesia Transfer of Care Note  Patient: Monique [name]  Procedure(s) Performed: Procedure(s): LEFT MASTECTOMY WITH LEFT AXILLARY SENTINEL LYMPH NODE BIOPSY (Left)  Patient Location: PACU  Anesthesia Type:GA combined with regional for post-op pain  Level of Consciousness: sedated  Airway & Oxygen Therapy: Patient Spontanous Breathing and Patient connected to face mask oxygen  Post-op Assessment: Report given to RN and Post -op Vital signs reviewed and stable  Post vital signs: Reviewed and stable  Last Vitals:  Vitals:   08/03/17 0732 08/03/17 0735  BP: 110/62 113/60  Pulse: 70 71  Resp: 15 15  Temp:    SpO2: 100% 100%    Last Pain:  Vitals:   08/03/17 0732  TempSrc:   PainSc: 0-No pain      Patients Stated Pain Goal: 3 (87/56/43 [id number])  Complications: No apparent anesthesia complications

## 2017-08-04 ENCOUNTER — Encounter (HOSPITAL_BASED_OUTPATIENT_CLINIC_OR_DEPARTMENT_OTHER): Payer: Self-pay | Admitting: Surgery

## 2017-08-04 DIAGNOSIS — Z79899 Other long term (current) drug therapy: Secondary | ICD-10-CM | POA: Diagnosis not present

## 2017-08-04 DIAGNOSIS — Z885 Allergy status to narcotic agent status: Secondary | ICD-10-CM | POA: Diagnosis not present

## 2017-08-04 DIAGNOSIS — F17210 Nicotine dependence, cigarettes, uncomplicated: Secondary | ICD-10-CM | POA: Diagnosis not present

## 2017-08-04 DIAGNOSIS — Z170 Estrogen receptor positive status [ER+]: Secondary | ICD-10-CM | POA: Diagnosis not present

## 2017-08-04 DIAGNOSIS — Z888 Allergy status to other drugs, medicaments and biological substances status: Secondary | ICD-10-CM | POA: Diagnosis not present

## 2017-08-04 DIAGNOSIS — C50412 Malignant neoplasm of upper-outer quadrant of left female breast: Secondary | ICD-10-CM | POA: Diagnosis not present

## 2017-08-04 DIAGNOSIS — Z79891 Long term (current) use of opiate analgesic: Secondary | ICD-10-CM | POA: Diagnosis not present

## 2017-08-04 MED ORDER — ALUM & MAG HYDROXIDE-SIMETH 200-200-20 MG/5ML PO SUSP
30.0000 mL | Freq: Four times a day (QID) | ORAL | Status: DC | PRN
  Administered 2017-08-04: 30 mL via ORAL
  Filled 2017-08-04: qty 30

## 2017-08-04 MED ORDER — HEPARIN SODIUM (PORCINE) 5000 UNIT/ML IJ SOLN
INTRAMUSCULAR | Status: AC
Start: 2017-08-04 — End: 2017-08-04
  Filled 2017-08-04: qty 1

## 2017-08-04 MED ORDER — TRAMADOL HCL 50 MG PO TABS: 50.0000 mg | ORAL_TABLET | Freq: Four times a day (QID) | ORAL | 0 refills | Status: DC | PRN

## 2017-08-04 NOTE — Discharge Summary (Signed)
Physician Discharge Summary  Patient ID:  Monique [name]  MRN: [id number]  DOB/AGE: 24-Feb-1956 61 y.o.  Admit date: 08/03/2017 Discharge date: 08/04/2017  Discharge Diagnoses:  1.  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 She's had vertigo, but it sounds like she is doing well now 6) Hepatitis From drugs - remote   Active Problems:   Cancer of left breast, stage 2 ([place])  Operation: Procedure(s): LEFT MASTECTOMY WITH LEFT AXILLARY SENTINEL LYMPH NODE BIOPSY on 08/03/2017 - D[facility]  Discharged Condition: good  Hospital Course: Monique [name] is an 61 y.o. female whose primary care physician is Janyth Pupa, DO and who was admitted 08/03/2017 with a chief complaint of left breast cancer.   She was brought to the operating room on 08/03/2017 and underwent  LEFT MASTECTOMY WITH LEFT AXILLARY SENTINEL LYMPH NODE BIOPSY.   She is now one day post op.  She is a little nauseated, but she has eaten a bit much.  She is ready to go home.  The discharge instructions were reviewed with the patient.  Consults: None  Significant Diagnostic Studies: Results for orders placed or performed in visit on 05/24/17  CBC with Differential  Result Value Ref Range   WBC 7.5 3.9 - 10.3 10e3/uL   NEUT# 4.1 1.5 - 6.5 10e3/uL   HGB 14.1 11.6 - 15.9 g/dL   HCT 41.6 34.8 - 46.6 %   Platelets 272 145 - 400 10e3/uL   MCV 87.5 79.5 - 101.0 fL   MCH 29.8 25.1 - 34.0 pg   MCHC 34.0 31.5 - 36.0 g/dL   RBC 4.75 3.70 - 5.45 10e6/uL   RDW 13.1 11.2 - 14.5 %   lymph# 2.6 0.9 - 3.3 10e3/uL   MONO# 0.4 0.1 - 0.9 10e3/uL   Eosinophils Absolute 0.3 0.0 - 0.5 10e3/uL   Basophils Absolute 0.1 0.0 - 0.1 10e3/uL   NEUT% 54.4 38.4 - 76.8 %   LYMPH% 34.2 14.0 - 49.7 %   MONO% 5.9 0.0 - 14.0 %   EOS% 4.6 0.0 - 7.0 %   BASO% 0.9 0.0 - 2.0 %  Comprehensive metabolic panel  Result Value Ref Range   Sodium 138 136 - 145 mEq/L   Potassium 4.1 3.5 - 5.1 mEq/L   Chloride 101 98 - 109 mEq/L   CO2 27 22 - 29 mEq/L   Glucose 98 70 - 140 mg/dl   BUN 6.0 (L) 7.0 - 26.0 mg/dL   Creatinine 0.8 0.6 - 1.1 mg/dL   Total Bilirubin 0.48 0.20 - 1.20 mg/dL   Alkaline Phosphatase 67 40 - 150 U/L   AST 22 5 - 34 U/L   ALT 19 0 - 55 U/L   Total Protein 7.1 6.4 - 8.3 g/dL   Albumin 4.1 3.5 - 5.0 g/dL   Calcium 9.8 8.4 - 10.4 mg/dL   Anion Gap 9 3 - 11 mEq/L   EGFR 84 (L) >90 ml/min/1.73 m2  Draw extra clot tube  Result Value Ref Range   Extra Tube Sample held in lab for 7 days     No results found.  Discharge Exam:  Vitals:   08/04/17 0550 08/04/17 0612  BP: (!) 97/53   Pulse: (!) 54   Resp: 16   Temp: 98.4 F (36.9 C)   SpO2:  92%    General: Thinner WN WF who is alert and generally healthy appearing.  Lungs: Clear to auscultation and symmetric breath sounds. Heart:  RRR. No murmur or rub. Chest:  Dressing intact.  Drains - 109 cc recorded. Abdomen: Soft. No mass. No tenderness. No hernia. Normal bowel sounds.   Discharge Medications:   Allergies as of 08/04/2017      Reactions   Bupropion Other (See Comments)   Hallucinate   Bupropion Hcl Other (See Comments)   hallucinations   Codeine Nausea And Vomiting   Doxycycline Nausea And Vomiting   Varenicline Tartrate Palpitations      Medication List    TAKE these medications   ALPRAZolam 0.5 MG tablet Commonly known as:  XANAX Take 0.5 mg by mouth at bedtime as needed. For anxiety and sleep   anastrozole 1 MG tablet Commonly known as:  ARIMIDEX Take 1 tablet (1 mg total) by mouth daily.   calcium carbonate 1250 MG capsule Take 1,250 mg by mouth 2  (two) times daily with a meal.   HYDROcodone-acetaminophen 5-325 MG tablet Commonly known as:  NORCO/VICODIN Take 1 tablet by mouth every 6 (six) hours as needed for moderate pain.   OVER THE COUNTER MEDICATION Take 1 tablet by mouth daily as needed. For dizziness. Medication name unknown per patient.   traMADol 50 MG tablet Commonly known as:  ULTRAM Take 1 tablet (50 mg total) by mouth every 6 (six) hours as needed (mild pain).       Disposition: 01-Home or Self Care  Discharge Instructions    Diet - low sodium heart healthy    Complete by:  As directed    Increase activity slowly    Complete by:  As directed          Signed: Alphonsa Overall, M.D., [facility] Surgery Office:  [phone]  08/04/2017, 7:07 AM

## 2017-08-04 NOTE — Discharge Instructions (Signed)
[facility] [place] [facility] - DISCHARGE INSTRUCTIONS TO PATIENT  Activity:  Driving - May drive in 3 or 4 days, if doing well   Lifting - No lifting more than 15 pounds for one week, then unlimited  Wound Care:   May remove bandage and shower starting Sunday, 8/19.        Empty drain bulb twice a day and record the amount  Diet:  As tolerated.  Eat light for a couple of days.  Follow up appointment:  Call Dr. Pollie Friar office [facility]) at [phone] for an appointment in 1 week.  Medications and dosages:  Resume your home medications.  You have a prescription for:  Ultram  Call Dr. Lucia Gaskins or his office  [phone]) if you have:  Temperature greater than 100.4,  Persistent nausea and vomiting,  Severe uncontrolled pain,  Redness, tenderness, or signs of infection (pain, swelling, redness, odor or green/yellow discharge around the site),  Difficulty breathing, headache or visual disturbances,  Any other questions or concerns you may have after discharge.  In an emergency, call 911 or go to an Emergency Department at a nearby hospital.      [facility]  Bring this sheet to all of your post-operative appointments while you have your drains.  Please measure your drains by CC's or ML's.  Make sure you drain and measure your JP Drains 2 or 3 times per day.  At the end of each day, add up totals for the left side and add up totals for the right side.    ( 9 am )     ( 3 pm )        ( 9 pm )                Date L  R  L  R  L  R  Total L/R                                                                                                                                                                                        Post Anesthesia Home Care Instructions  Activity: Get plenty of rest for the remainder of the day. A responsible individual must stay with you for 24 hours following the procedure.  For the next 24 hours, DO NOT: -Drive a  car -Paediatric nurse -Drink alcoholic beverages -Take any medication unless instructed by your physician -Make any legal decisions or sign important papers.  Meals: Start with liquid foods such as gelatin or soup. Progress to regular foods as tolerated. Avoid greasy, spicy, heavy foods. If nausea and/or vomiting occur, drink only clear liquids until the  nausea and/or vomiting subsides. Call your physician if vomiting continues.  Special Instructions/Symptoms: Your throat may feel dry or sore from the anesthesia or the breathing tube placed in your throat during surgery. If this causes discomfort, gargle with warm salt water. The discomfort should disappear within 24 hours.  If you had a scopolamine patch placed behind your ear for the management of post- operative nausea and/or vomiting:  1. The medication in the patch is effective for 72 hours, after which it should be removed.  Wrap patch in a tissue and discard in the trash. Wash hands thoroughly with soap and water. 2. You may remove the patch earlier than 72 hours if you experience unpleasant side effects which may include dry mouth, dizziness or visual disturbances. 3. Avoid touching the patch. Wash your hands with soap and water after contact with the patch.

## 2017-08-09 DIAGNOSIS — Z853 Personal history of malignant neoplasm of breast: Secondary | ICD-10-CM | POA: Diagnosis not present

## 2017-08-10 NOTE — Assessment & Plan Note (Addendum)
08/03/17:Lt Mastectomy: IDC grade 2; 3.2 cm, margins Neg, ER 100%, PR 95%, Ki 67 15% Her 2 Neg Ratio 1.68; 0/2 LN Neg, T2N0 Stage 1A Oncotype DX score 5, low risk  Pathology counseling: I discussed the final pathology report of the patient provided  a copy of this report. I discussed the margins as well as lymph node surgeries. We also discussed the final staging along with previously performed ER/PR and HER-2/neu testing.  Recommendation: 1. adjuvant radiation therapy followed by 2. adjuvant antiestrogen therapy with anastrozole 1 mg daily 5 years   Preoperatively patient took anastrozole. She tolerated it very well. She will resume taking anastrozole after radiation is complete. I will see the patient back in 6 months for follow-up.

## 2017-08-11 ENCOUNTER — Encounter: Payer: Self-pay | Admitting: Hematology and Oncology

## 2017-08-11 ENCOUNTER — Telehealth: Payer: Self-pay | Admitting: Medical Oncology

## 2017-08-11 ENCOUNTER — Other Ambulatory Visit: Payer: Self-pay | Admitting: Emergency Medicine

## 2017-08-11 ENCOUNTER — Ambulatory Visit (HOSPITAL_BASED_OUTPATIENT_CLINIC_OR_DEPARTMENT_OTHER): Payer: BLUE CROSS/BLUE SHIELD | Admitting: Hematology and Oncology

## 2017-08-11 ENCOUNTER — Telehealth: Payer: Self-pay

## 2017-08-11 DIAGNOSIS — Z170 Estrogen receptor positive status [ER+]: Secondary | ICD-10-CM

## 2017-08-11 DIAGNOSIS — C50412 Malignant neoplasm of upper-outer quadrant of left female breast: Secondary | ICD-10-CM

## 2017-08-11 MED ORDER — TAMOXIFEN CITRATE 20 MG PO TABS: 20.0000 mg | ORAL_TABLET | Freq: Every day | ORAL | 3 refills | Status: DC

## 2017-08-11 NOTE — Telephone Encounter (Signed)
appt made per 8/24 los,avs mailed to patient

## 2017-08-11 NOTE — Telephone Encounter (Signed)
This nurse returned the call to [facility] in regards to tamoxifen refill. Patient does not take anastrozole per md notes. Informed them it was ok to fill tamoxifen per md.

## 2017-08-11 NOTE — Telephone Encounter (Signed)
Pharmacy reports drug interaction with tamoxifen and anastrazole -decreasing anastrzole level by 27%. Ok to fill tamoxifen?

## 2017-08-11 NOTE — Telephone Encounter (Signed)
Will verify this with MD and call pharmacy for clarification.

## 2017-08-11 NOTE — Progress Notes (Signed)
Patient Care Team: Janyth Pupa, DO as PCP - General (Obstetrics and Gynecology) Janyth Pupa, DO as Consulting Physician (Obstetrics and Gynecology) Alphonsa Overall, MD as Consulting Physician (General Surgery) Magrinat, Virgie Dad, MD as Consulting Physician (Oncology) Kyung Rudd, MD as Consulting Physician (Radiation Oncology)  DIAGNOSIS:  Encounter Diagnosis  Name Primary?  . Malignant neoplasm of upper-outer quadrant of left breast in female, estrogen receptor positive ([place])     SUMMARY OF ONCOLOGIC HISTORY:   Malignant neoplasm of upper-outer quadrant of left breast in female, estrogen receptor positive ([place])   05/12/2017 Initial Diagnosis    Palpable left breast mass with nipple inversion, mammogram revealed mass at 12:30 position 2.9 cm with suspicious lymph node in axilla. 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  The Hereditary Gene Panel offered by [facility] includes sequencing and/or deletion duplication testing of the following 46 genes: APC, ATM, AXIN2, BARD1, BMPR1A, BRCA1, BRCA2, BRIP1, CDH1, CDKN2A (p14ARF), CDKN2A (p16INK4a), CHEK2, CTNNA1, DICER1, EPCAM (Deletion/duplication testing only), GREM1 (promoter region deletion/duplication testing only), KIT, MEN1, MLH1, MSH2, MSH3, MSH6, MUTYH, NBN, NF1, NHTL1, PALB2, PDGFRA, PMS2, POLD1, POLE, PTEN, RAD50, RAD51C, RAD51D, SDHB, SDHC, SDHD, SMAD4, SMARCA4. STK11, TP53, TSC1, TSC2, and VHL.  The following genes were evaluated for sequence changes only: SDHA and HOXB13 c.251G>A variant only.  The report date is June 05, 2017.       08/03/2017 Surgery    Lt Mastectomy: IDC grade 2; 3.2 cm, margins Neg, ER 100%, PR 95%, Ki 67 15% Her 2 Neg Ratio 1.68; 0/2  LN Neg, T2N0 Stage 1A       CHIEF COMPLIANT:  Follow-up after recent  Mastectomy  INTERVAL HISTORY: Monique [name] is a  61 year old with above-mentioned history left breast cancer who underwent a left mastectomy and is here to discuss the results.  She had Oncotype DX test done before surgery which came back as a score of 5. She did not need systemic chemotherapy. She currently has a drain but otherwise doing quite well.  REVIEW OF SYSTEMS:   Constitutional: Denies fevers, chills or abnormal weight loss Eyes: Denies blurriness of vision Ears, nose, mouth, throat, and face: Denies mucositis or sore throat Respiratory: Denies cough, dyspnea or wheezes Cardiovascular: Denies palpitation, chest discomfort Gastrointestinal:  Denies nausea, heartburn or change in bowel habits Skin: Denies abnormal skin rashes Lymphatics: Denies new lymphadenopathy or easy bruising Neurological:Denies numbness, tingling or new weaknesses Behavioral/Psych: Mood is stable, no new changes  Extremities: No lower extremity edema Breast:  denies any pain or lumps or nodules in either breasts All other systems were reviewed with the patient and are negative.  I have reviewed the past medical history, past surgical history, social history and family history with the patient and they are unchanged from previous note.  ALLERGIES:  is allergic to bupropion; bupropion hcl; codeine; doxycycline; and varenicline tartrate.  MEDICATIONS:  Current Outpatient Prescriptions  Medication Sig Dispense Refill  . ALPRAZolam (XANAX) 0.5 MG tablet Take 0.5 mg by mouth at bedtime as needed. For anxiety and sleep    . calcium carbonate 1250 MG capsule Take 1,250 mg by mouth 2 (two) times daily with a meal.    .  HYDROcodone-acetaminophen (NORCO/VICODIN) 5-325 MG tablet Take 1 tablet by mouth every 6 (six) hours as needed for moderate pain.    Marland Kitchen OVER THE COUNTER MEDICATION Take 1 tablet by mouth daily as needed. For dizziness.  Medication name unknown per patient.    . tamoxifen (NOLVADEX) 20 MG tablet Take 1 tablet (20 mg total) by mouth daily. 90 tablet 3  . traMADol (ULTRAM) 50 MG tablet Take 1 tablet (50 mg total) by mouth every 6 (six) hours as needed (mild pain). 15 tablet 0   No current facility-administered medications for this visit.     PHYSICAL EXAMINATION: ECOG PERFORMANCE STATUS: 1 - Symptomatic but completely ambulatory  Vitals:   08/11/17 0947  BP: 127/67  Pulse: 72  Resp: 18  Temp: 98.3 F (36.8 C)  SpO2: 99%   Filed Weights   08/11/17 0947  Weight: 107 lb 14.4 oz (48.9 kg)    GENERAL:alert, no distress and comfortable SKIN: skin color, texture, turgor are normal, no rashes or significant lesions EYES: normal, Conjunctiva are pink and non-injected, sclera clear OROPHARYNX:no exudate, no erythema and lips, buccal mucosa, and tongue normal  NECK: supple, thyroid normal size, non-tender, without nodularity LYMPH:  no palpable lymphadenopathy in the cervical, axillary or inguinal LUNGS: clear to auscultation and percussion with normal breathing effort HEART: regular rate & rhythm and no murmurs and no lower extremity edema ABDOMEN:abdomen soft, non-tender and normal bowel sounds MUSCULOSKELETAL:no cyanosis of digits and no clubbing  NEURO: alert & oriented x 3 with fluent speech, no focal motor/sensory deficits EXTREMITIES: No lower extremity edema BREAST:  Right mastectomy  LABORATORY DATA:  I have reviewed the data as listed   Chemistry      Component Value Date/Time   NA 138 05/24/2017 0825   K 4.1 05/24/2017 0825   CO2 27 05/24/2017 0825   BUN 6.0 (L) 05/24/2017 0825   CREATININE 0.8 05/24/2017 0825      Component Value Date/Time   CALCIUM 9.8 05/24/2017 0825   ALKPHOS 67 05/24/2017 0825   AST 22 05/24/2017 0825   ALT 19 05/24/2017 0825   BILITOT 0.48 05/24/2017 0825       Lab Results  Component Value Date   WBC 7.5 05/24/2017   HGB 14.1 05/24/2017   HCT 41.6  05/24/2017   MCV 87.5 05/24/2017   PLT 272 05/24/2017   NEUTROABS 4.1 05/24/2017    ASSESSMENT & PLAN:  Malignant neoplasm of upper-outer quadrant of left breast in female, estrogen receptor positive ([facility]) 08/03/17:Lt Mastectomy: IDC grade 2; 3.2 cm, margins Neg, ER 100%, PR 95%, Ki 67 15% Her 2 Neg Ratio 1.68; 0/2 LN Neg, T2N0 Stage 1A Oncotype DX score 5, low risk  Pathology counseling: I discussed the final pathology report of the patient provided  a copy of this report. I discussed the margins as well as lymph node surgeries. We also discussed the final staging along with previously performed ER/PR and HER-2/neu testing.  Recommendation: 1. adjuvant radiation therapy followed by 2. adjuvant antiestrogen therapy   Preoperatively patient took anastrozole.  She did not tolerate anastrozole at all. She had diffuse muscle aches and pains.  Because of this we elected to switch her to tamoxifen 20 mg daily.  If she cannot tolerate tamoxifen then we'll switch her to letrozole.   Return to clinic in 3 months for follow-up. I spent 25 minutes talking to the patient of which more than half was spent in counseling and coordination of care.  No orders of the  defined types were placed in this encounter.  The patient has a good understanding of the overall plan. she agrees with it. she will call with any problems that may develop before the next visit here.   Rulon Eisenmenger, MD 08/11/17

## 2017-08-14 ENCOUNTER — Encounter: Payer: Self-pay | Admitting: *Deleted

## 2017-08-15 ENCOUNTER — Telehealth: Payer: Self-pay | Admitting: Adult Health

## 2017-08-15 NOTE — Telephone Encounter (Signed)
Scheduled appt per 8/27 - sent reminder letter in the mail.

## 2017-09-05 DIAGNOSIS — Z853 Personal history of malignant neoplasm of breast: Secondary | ICD-10-CM | POA: Diagnosis not present

## 2017-09-06 DIAGNOSIS — L814 Other melanin hyperpigmentation: Secondary | ICD-10-CM | POA: Diagnosis not present

## 2017-09-06 DIAGNOSIS — L821 Other seborrheic keratosis: Secondary | ICD-10-CM | POA: Diagnosis not present

## 2017-09-06 DIAGNOSIS — D225 Melanocytic nevi of trunk: Secondary | ICD-10-CM | POA: Diagnosis not present

## 2017-09-06 DIAGNOSIS — L400 Psoriasis vulgaris: Secondary | ICD-10-CM | POA: Diagnosis not present

## 2017-09-12 ENCOUNTER — Ambulatory Visit: Payer: BLUE CROSS/BLUE SHIELD | Attending: Surgery | Admitting: Physical Therapy

## 2017-09-12 DIAGNOSIS — M25612 Stiffness of left shoulder, not elsewhere classified: Secondary | ICD-10-CM

## 2017-09-12 DIAGNOSIS — M25512 Pain in left shoulder: Secondary | ICD-10-CM | POA: Insufficient documentation

## 2017-09-12 NOTE — Therapy (Signed)
[place], [place], [address] Phone: [phone]   Fax:  [phone]  Physical Therapy Treatment  Patient Details  Name: Monique [name] MRN: [id number] Date of Birth: June 10, 1956 Referring Provider: Dr. Alphonsa Overall  Encounter Date: 09/12/2017      PT End of Session - 09/12/17 1650    Visit Number 9   Number of Visits 17   Date for PT Re-Evaluation 10/20/17   PT Start Time 1351   PT Stop Time 1429   PT Time Calculation (min) 38 min   Activity Tolerance Patient tolerated treatment well   Behavior During Therapy [facility] for tasks assessed/performed      Past Medical History:  Diagnosis Date  . Abnormal EKG   . Barrett's esophagus   . Complication of anesthesia   . Family history of breast cancer   . Family history of prostate cancer   . Family history of thyroid cancer   . Fatigue   . Generalized headaches   . IBS (irritable bowel syndrome)   . Meniere disease   . PONV (postoperative nausea and vomiting)   . Psoriasis   . Tinnitus    left ear    Past Surgical History:  Procedure Laterality Date  . DILATION AND CURETTAGE OF UTERUS    . MASTECTOMY W/ SENTINEL NODE BIOPSY Left 08/03/2017   Procedure: LEFT MASTECTOMY WITH LEFT AXILLARY SENTINEL LYMPH NODE BIOPSY;  Surgeon: Alphonsa Overall, MD;  Location: [place];  Service: General;  Laterality: Left;  . skin cancer removal    . TONSILLECTOMY      There were no vitals filed for this visit.      Subjective Assessment - 09/12/17 1352    Subjective I'm so glad I went ahead and had the total mastectomy.  I don't have to take pills, I don't have to do chemo or radiation. Still having trouble tolerating wearing bras, but prefers camisoles. Got the okay from her surgeon to come back to therapy. Says she has been doing Theraband exercises (yellow).   Pertinent History Patient was diagnosed on 05/09/17 with left invasive ductal  carcinoma breast cancer. It is ER/PR positive and HER2 negative with a Ki67 of 15%. It measures 2.9 cm and is located in the upper outer quadrant. A lymph node was biopsied and was negative. Her left shoulder pain has gone untreated and now her ROM is significantly limited and impacting daily tasks. Had left total mastectomy 08/03/17 with two nodes removed, both negative.  No reconstruction planned.   Patient Stated Goals "I want to get back to where I was before the surgery. I want to get this (left axilla) back to not hurting."   Currently in Pain? Yes   Pain Score 2   up to 8   Pain Location Axilla   Pain Orientation Left;Lower   Pain Descriptors / Indicators Aching   Pain Type Surgical pain   Aggravating Factors  wearing a bra, making up the bed, certain movements   Pain Relieving Factors wear the camisole instead of the bra, pain pills when needed            [facility] PT Assessment - 09/12/17 0001      Observation/Other Assessments   Observations some fullness around mastectomy incision--redundant tissue or possible swelling   Skin Integrity mastectomy incision healing well, still with some glue in place and small scabs     AROM   Left Shoulder Extension 43 Degrees  Left Shoulder Flexion 119 Degrees   Left Shoulder ABduction 98 Degrees   Left Shoulder Internal Rotation 65 Degrees  in supine; she feels pulling into chest tissue   Left Shoulder External Rotation 40 Degrees  in supine; discomfort in incision and in flank           LYMPHEDEMA/ONCOLOGY QUESTIONNAIRE - 09/12/17 1412      Left Upper Extremity Lymphedema   10 cm Proximal to Olecranon Process 23.7 cm   Olecranon Process 21.4 cm   10 cm Proximal to Ulnar Styloid Process 19 cm   Just Proximal to Ulnar Styloid Process 14.6 cm   Across Hand at [employer] 17.3 cm   At [place] of 2nd Digit 5.5 cm                  [facility] Adult PT Treatment/Exercise - 09/12/17 0001      Manual Therapy   Passive ROM  gentle PROM to left shoulder in direction of flexion, abduction and ER                      Breast Clinic Goals - 05/24/17 1141      Patient will be able to verbalize understanding of pertinent lymphedema risk reduction practices relevant to her diagnosis specifically related to skin care.   Time 1   Period Days   Status Achieved     Patient will be able to return demonstrate and/or verbalize understanding of the post-op home exercise program related to regaining shoulder range of motion.   Time 1   Period Days   Status Achieved     Patient will be able to verbalize understanding of the importance of attending the postoperative After Breast Cancer Class for further lymphedema risk reduction education and therapeutic exercise.   Time 1   Period Days   Status Achieved          Long Term Clinic Goals - 09/12/17 1654      CC Long Term Goal  #1   Title Patient will demonstrate a home exercise program for improving shoulder ROM.   Time 4   Period Weeks   Status New     CC Long Term Goal  #2   Title Patient will increase left shoulder flexion and abduction ROM to be >/= 150 degrees for increased ease reaching.   Baseline At time of re-eval on 09/12/17, left shoulder flexion 119 and abduction 98.   Time 4   Period Weeks   Status New     CC Long Term Goal  #3   Title Left shoulder external rotation to at least 85 degrees for improved ADLs.   Baseline 40 degrees at re-eval on 09/12/17   Time 4   Period Weeks   Status New     CC Long Term Goal  #4   Title Left shoulder pain decreased at least 60% with activity.   Baseline up to 8/10 at re-eval on 09/12/17   Time 4   Period Weeks   Status New            Plan - 09/12/17 1651    Clinical Impression Statement Pt. had her mastectomy 08/03/17 and is pleased with that choice.  She is choosing not to take any anti-estrogen medication and not to have reconstruction.  She does have limited left shoulder ROM compared  to prior to surgery and will benefit from therapy to remedy this. This was a re-eval.   Rehab  Potential Excellent   PT Frequency 2x / week   PT Duration 4 weeks   PT Treatment/Interventions ADLs/Self Care Home Management;Electrical Stimulation;Therapeutic exercise;Patient/family education;Manual techniques;Manual lymph drainage;Passive range of motion;Scar mobilization   PT Next Visit Plan Continue P/AA/A/ROM.  Update home exercise program.   Consulted and Agree with Plan of Care Patient      Patient will benefit from skilled therapeutic intervention in order to improve the following deficits and impairments:  Decreased range of motion, Impaired UE functional use, Pain, Decreased strength  Visit Diagnosis: Stiffness of left shoulder, not elsewhere classified - Plan: PT plan of care cert/re-cert  Acute pain of left shoulder - Plan: PT plan of care cert/re-cert     Problem List Patient Active Problem List   Diagnosis Date Noted  . Cancer of left breast, stage 2 ([place]) 08/03/2017  . Genetic testing 06/08/2017  . Family history of breast cancer   . Family history of prostate cancer   . Family history of thyroid cancer   . Malignant neoplasm of upper-outer quadrant of left breast in female, estrogen receptor positive ([place]) 05/24/2017  . PERIPHERAL NEUROPATHY 03/15/2010  . RENAL CYST 03/15/2010  . FATIGUE 03/15/2010  . NAUSEA 03/15/2010  . ABDOMINAL BLOATING 03/15/2010  . ABDOMINAL PAIN, UNSPECIFIED SITE 03/15/2010    Ria Redcay 09/12/2017, 5:03 PM  [place], [place], [address] Phone: [phone]   Fax:  [phone]  Name: Monique [name] MRN: [id number] Date of Birth: July 26, 1956  Serafina Royals, PT 09/12/17 5:03 PM

## 2017-09-19 ENCOUNTER — Ambulatory Visit: Payer: BLUE CROSS/BLUE SHIELD | Attending: Surgery

## 2017-09-19 DIAGNOSIS — M25512 Pain in left shoulder: Secondary | ICD-10-CM

## 2017-09-19 DIAGNOSIS — G8929 Other chronic pain: Secondary | ICD-10-CM

## 2017-09-19 DIAGNOSIS — M25612 Stiffness of left shoulder, not elsewhere classified: Secondary | ICD-10-CM

## 2017-09-19 DIAGNOSIS — R293 Abnormal posture: Secondary | ICD-10-CM | POA: Diagnosis not present

## 2017-09-19 NOTE — Therapy (Signed)
[facility] [address] [place], [place], [address] Phone: [phone]   Fax:  [phone]  Physical Therapy Treatment  Patient Details  Name: Monique [name] MRN: [id number] Date of Birth: 01/16/1956 Referring Provider: Dr. David Newman  Encounter Date: 09/19/2017      PT End of Session - 09/19/17 0927    Visit Number 10   Number of Visits 17   Date for PT Re-Evaluation 10/20/17   PT Start Time 0844   PT Stop Time 0929   PT Time Calculation (min) 45 min   Activity Tolerance Patient tolerated treatment well   Behavior During Therapy WFL for tasks assessed/performed      Past Medical History:  Diagnosis Date  . Abnormal EKG   . Barrett's esophagus   . Complication of anesthesia   . Family history of breast cancer   . Family history of prostate cancer   . Family history of thyroid cancer   . Fatigue   . Generalized headaches   . IBS (irritable bowel syndrome)   . Meniere disease   . PONV (postoperative nausea and vomiting)   . Psoriasis   . Tinnitus    left ear    Past Surgical History:  Procedure Laterality Date  . DILATION AND CURETTAGE OF UTERUS    . MASTECTOMY W/ SENTINEL NODE BIOPSY Left 08/03/2017   Procedure: LEFT MASTECTOMY WITH LEFT AXILLARY SENTINEL LYMPH NODE BIOPSY;  Surgeon: Newman, David, MD;  Location: [facility];  Service: General;  Laterality: Left;  . skin cancer removal    . TONSILLECTOMY      There were no vitals filed for this visit.      Subjective Assessment - 09/19/17 0846    Subjective If I didn't have my lymph nodes removed (sore at that site) I think I'd be doing great.   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 It measures 2.9 cm and is located in the upper outer quadrant. A lymph node was biopsied and was negative. Her left shoulder pain has  gone untreated and now her ROM is significantly limited and impacting daily tasks. Had left total mastectomy 08/03/17 with two nodes removed, both negative.  No reconstruction planned.   Patient Stated Goals "I want to get back to where I was before the surgery. I want to get this (left axilla) back to not hurting."   Currently in Pain? No/denies                         [facility] Adult PT Treatment/Exercise - 09/19/17 0001      Shoulder Exercises: Pulleys   Flexion 2 minutes   Flexion Limitations VCs to remind pt to decrease shoulder hike   ABduction 2 minutes   ABduction Limitations Tactile cues to decrease Lt shoulder hike     Shoulder Exercises: Therapy Ball   Flexion 10 reps  With forward lean into end of stretch   ABduction 5 reps  Lt UE with side lean into end of stretch     Shoulder Exercises: Stretch   Corner Stretch 3 reps;20 seconds  In doorway     Manual Therapy   Manual Therapy Passive ROM   Passive ROM gentle PROM to left shoulder in direction of flexion, abduction and ER                        Breast Clinic Goals - 05/24/17 1141      Patient will be able to verbalize understanding of pertinent lymphedema risk reduction practices relevant to her diagnosis specifically related to skin care.   Time 1   Period Days   Status Achieved     Patient will be able to return demonstrate and/or verbalize understanding of the post-op home exercise program related to regaining shoulder range of motion.   Time 1   Period Days   Status Achieved     Patient will be able to verbalize understanding of the importance of attending the postoperative After Breast Cancer Class for further lymphedema risk reduction education and therapeutic exercise.   Time 1   Period Days   Status Achieved          Long Term Clinic Goals - 09/12/17 1654      CC Long Term Goal  #1   Title Patient will demonstrate a home exercise program for improving shoulder ROM.    Time 4   Period Weeks   Status New     CC Long Term Goal  #2   Title Patient will increase left shoulder flexion and abduction ROM to be >/= 150 degrees for increased ease reaching.   Baseline At time of re-eval on 09/12/17, left shoulder flexion 119 and abduction 98.   Time 4   Period Weeks   Status New     CC Long Term Goal  #3   Title Left shoulder external rotation to at least 85 degrees for improved ADLs.   Baseline 40 degrees at re-eval on 09/12/17   Time 4   Period Weeks   Status New     CC Long Term Goal  #4   Title Left shoulder pain decreased at least 60% with activity.   Baseline up to 8/10 at re-eval on 09/12/17   Time 4   Period Weeks   Status New            Plan - 09/19/17 0928    Clinical Impression Statement Pt tolerated stretching very well today, passive and AA/ROM, with no increased pain and reported feeling much looser after session. She was very pleased overall with how she appears to be regaining her motion so well with little to no pain thus far.    Rehab Potential Excellent   Clinical Impairments Affecting Rehab Potential Chronic condition   PT Frequency 2x / week   PT Duration 4 weeks   PT Treatment/Interventions ADLs/Self Care Home Management;Electrical Stimulation;Therapeutic exercise;Patient/family education;Manual techniques;Manual lymph drainage;Passive range of motion;Scar mobilization   PT Next Visit Plan Continue P/AA/A/ROM.  Update home exercise program, including doorway stretch.   Consulted and Agree with Plan of Care Patient      Patient will benefit from skilled therapeutic intervention in order to improve the following deficits and impairments:  Decreased range of motion, Impaired UE functional use, Pain, Decreased strength  Visit Diagnosis: Stiffness of left shoulder, not elsewhere classified  Acute pain of left shoulder  Abnormal posture  Chronic left shoulder pain     Problem List Patient Active Problem List   Diagnosis  Date Noted  . Cancer of left breast, stage 2 ([facility]) 08/03/2017  . Genetic testing 06/08/2017  . Family history of breast cancer   . Family history of prostate cancer   . Family history of thyroid cancer   . Malignant neoplasm of upper-outer quadrant of left breast in female, estrogen receptor positive ([facility]) 05/24/2017  . PERIPHERAL NEUROPATHY 03/15/2010  .   RENAL CYST 03/15/2010  . FATIGUE 03/15/2010  . NAUSEA 03/15/2010  . ABDOMINAL BLOATING 03/15/2010  . ABDOMINAL PAIN, UNSPECIFIED SITE 03/15/2010    Otelia Limes, PTA 09/19/2017, 9:32 AM  [place], [place], [address] Phone: [phone]   Fax:  [phone]  Name: JAILEN LUNG MRN: [id number] Date of Birth: 07/26/1956

## 2017-09-21 ENCOUNTER — Ambulatory Visit: Payer: BLUE CROSS/BLUE SHIELD

## 2017-09-21 DIAGNOSIS — R293 Abnormal posture: Secondary | ICD-10-CM

## 2017-09-21 DIAGNOSIS — G8929 Other chronic pain: Secondary | ICD-10-CM | POA: Diagnosis not present

## 2017-09-21 DIAGNOSIS — M25512 Pain in left shoulder: Secondary | ICD-10-CM | POA: Diagnosis not present

## 2017-09-21 DIAGNOSIS — M25612 Stiffness of left shoulder, not elsewhere classified: Secondary | ICD-10-CM

## 2017-09-21 NOTE — Therapy (Signed)
[facility] [address] [place], , [address] Phone: [phone]   Fax:  [phone]  Physical Therapy Treatment  Patient Details  Name: Monique [name] MRN: [id number] Date of Birth: 12/02/1956 Referring Provider: Dr. David Newman  Encounter Date: 09/21/2017      PT End of Session - 09/21/17 0932    Visit Number 11   Number of Visits 17   Date for PT Re-Evaluation 10/20/17   PT Start Time 0846   PT Stop Time 0931   PT Time Calculation (min) 45 min   Activity Tolerance Patient tolerated treatment well   Behavior During Therapy WFL for tasks assessed/performed      Past Medical History:  Diagnosis Date  . Abnormal EKG   . Barrett's esophagus   . Complication of anesthesia   . Family history of breast cancer   . Family history of prostate cancer   . Family history of thyroid cancer   . Fatigue   . Generalized headaches   . IBS (irritable bowel syndrome)   . Meniere disease   . PONV (postoperative nausea and vomiting)   . Psoriasis   . Tinnitus    left ear    Past Surgical History:  Procedure Laterality Date  . DILATION AND CURETTAGE OF UTERUS    . MASTECTOMY W/ SENTINEL NODE BIOPSY Left 08/03/2017   Procedure: LEFT MASTECTOMY WITH LEFT AXILLARY SENTINEL LYMPH NODE BIOPSY;  Surgeon: Newman, David, MD;  Location: [facility];  Service: General;  Laterality: Left;  . skin cancer removal    . TONSILLECTOMY      There were no vitals filed for this visit.      Subjective Assessment - 09/21/17 0849    Subjective I felt good after last visit and slept good that night.   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 It measures 2.9 cm and is located in the upper outer quadrant. A lymph node was biopsied and was negative. Her left shoulder pain has gone untreated and now her ROM is  significantly limited and impacting daily tasks. Had left total mastectomy 08/03/17 with two nodes removed, both negative.  No reconstruction planned.   Patient Stated Goals "I want to get back to where I was before the surgery. I want to get this (left axilla) back to not hurting."   Currently in Pain? No/denies            [facility] PT Assessment - 09/21/17 0001      AROM   Left Shoulder Flexion 124 Degrees   Left Shoulder ABduction 106 Degrees                     [facility] Adult PT Treatment/Exercise - 09/21/17 0001      Shoulder Exercises: Pulleys   Flexion 2 minutes   ABduction 2 minutes     Shoulder Exercises: Therapy Ball   Flexion 10 reps  1 lb on each wrist; forward lean into end of stretch   ABduction 10 reps  1 lb; Lt UE with side lean into end of stretch     Manual Therapy   Manual Therapy Passive ROM   Myofascial Release Gentle stretch to lateral trunk inferiorly during abduction stretch   Passive ROM PROM to left shoulder in direction of flexion, abduction and ER                       [facility] Term Clinic Goals - 09/12/17 1654      CC Long Term Goal  #1   Title Patient will demonstrate a home exercise program for improving shoulder ROM.   Time 4   Period Weeks   Status New     CC Long Term Goal  #2   Title Patient will increase left shoulder flexion and abduction ROM to be >/= 150 degrees for increased ease reaching.   Baseline At time of re-eval on 09/12/17, left shoulder flexion 119 and abduction 98.   Time 4   Period Weeks   Status New     CC Long Term Goal  #3   Title Left shoulder external rotation to at least 85 degrees for improved ADLs.   Baseline 40 degrees at re-eval on 09/12/17   Time 4   Period Weeks   Status New     CC Long Term Goal  #4   Title Left shoulder pain decreased at least 60% with activity.   Baseline up to 8/10 at re-eval on 09/12/17   Time 4   Period Weeks   Status New            Plan - 09/21/17 0932     Clinical Impression Statement Progressed pt today to include weights with ball roll up wall and pt tolerated this well but did report feeling the challenge from this. Her A/ROM has increased since last week and she reports already noticing improvement with her ADLs.    Rehab Potential Excellent   Clinical Impairments Affecting Rehab Potential Chronic condition   PT Frequency 2x / week   PT Duration 4 weeks   PT Treatment/Interventions ADLs/Self Care Home Management;Electrical Stimulation;Therapeutic exercise;Patient/family education;Manual techniques;Manual lymph drainage;Passive range of motion;Scar mobilization   PT Next Visit Plan Continue P/AA/A/ROM.  Update home exercise program, including doorway stretch.   Consulted and Agree with Plan of Care Patient      Patient will benefit from skilled therapeutic intervention in order to improve the following deficits and impairments:  Decreased range of motion, Impaired UE functional use, Pain, Decreased strength  Visit Diagnosis: Stiffness of left shoulder, not elsewhere classified  Acute pain of left shoulder  Abnormal posture  Chronic left shoulder pain     Problem List Patient Active Problem List   Diagnosis Date Noted  . Cancer of left breast, stage 2 (HCC) 08/03/2017  . Genetic testing 06/08/2017  . Family history of breast cancer   . Family history of prostate cancer   . Family history of thyroid cancer   . Malignant neoplasm of upper-outer quadrant of left breast in female, estrogen receptor positive (HCC) 05/24/2017  . PERIPHERAL NEUROPATHY 03/15/2010  . RENAL CYST 03/15/2010  . FATIGUE 03/15/2010  . NAUSEA 03/15/2010  . ABDOMINAL BLOATING 03/15/2010  . ABDOMINAL PAIN, UNSPECIFIED SITE 03/15/2010    ,  Ann, PTA 09/21/2017, 9:34 AM  [facility] [address] [place], , [address] Phone: [phone]   Fax:  [phone]  Name: Monique [name] MRN: [id number] Date of Birth: 07/01/1956   

## 2017-09-25 ENCOUNTER — Ambulatory Visit: Payer: BLUE CROSS/BLUE SHIELD

## 2017-09-25 DIAGNOSIS — G8929 Other chronic pain: Secondary | ICD-10-CM | POA: Diagnosis not present

## 2017-09-25 DIAGNOSIS — R293 Abnormal posture: Secondary | ICD-10-CM | POA: Diagnosis not present

## 2017-09-25 DIAGNOSIS — M25612 Stiffness of left shoulder, not elsewhere classified: Secondary | ICD-10-CM | POA: Diagnosis not present

## 2017-09-25 DIAGNOSIS — M25512 Pain in left shoulder: Secondary | ICD-10-CM | POA: Diagnosis not present

## 2017-09-25 NOTE — Therapy (Signed)
[place], [place], [address] Phone: [phone]   Fax:  [phone]  Physical Therapy Treatment  Patient Details  Name: Monique [name] MRN: [id number] Date of Birth: [birth year] Referring Provider: Dr. Alphonsa Overall  Encounter Date: 09/25/2017      PT End of Session - 09/25/17 1017    Visit Number 12   Number of Visits 17   Date for PT Re-Evaluation 10/20/17   PT Start Time 0935   PT Stop Time 1016   PT Time Calculation (min) 41 min   Activity Tolerance Patient tolerated treatment well   Behavior During Therapy [facility] for tasks assessed/performed      Past Medical History:  Diagnosis Date  . Abnormal EKG   . Barrett's esophagus   . Complication of anesthesia   . Family history of breast cancer   . Family history of prostate cancer   . Family history of thyroid cancer   . Fatigue   . Generalized headaches   . IBS (irritable bowel syndrome)   . Meniere disease   . PONV (postoperative nausea and vomiting)   . Psoriasis   . Tinnitus    left ear    Past Surgical History:  Procedure Laterality Date  . DILATION AND CURETTAGE OF UTERUS    . MASTECTOMY W/ SENTINEL NODE BIOPSY Left 08/03/2017   Procedure: LEFT MASTECTOMY WITH LEFT AXILLARY SENTINEL LYMPH NODE BIOPSY;  Surgeon: Alphonsa Overall, MD;  Location: [facility];  Service: General;  Laterality: Left;  . skin cancer removal    . TONSILLECTOMY      There were no vitals filed for this visit.      Subjective Assessment - 09/25/17 0938    Subjective My only trouble I really have is trying to shave under my arm bc of the reaching out to the side. Overall I can tell I'm healing.    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 It measures 2.9 cm and is located in the upper outer quadrant. A lymph node was biopsied and  was negative. Her left shoulder pain has gone untreated and now her ROM is significantly limited and impacting daily tasks. Had left total mastectomy 08/03/17 with two nodes removed, both negative.  No reconstruction planned.   Patient Stated Goals "I want to get back to where I was before the surgery. I want to get this (left axilla) back to not hurting."   Currently in Pain? No/denies                         [facility] Adult PT Treatment/Exercise - 09/25/17 0001      Shoulder Exercises: Pulleys   Flexion 2 minutes   ABduction 2 minutes     Shoulder Exercises: Therapy Ball   Flexion 10 reps  1 lb on each wrist; forward lean into end of stretch   ABduction 10 reps  1 lb, Lt UE with side lean into end of stretch     Shoulder Exercises: ROM/Strengthening   Other ROM/Strengthening Exercises Bil UE 3 way raises with 1 lb x10 each against wall, only pain with abduction at end ROM so limited motion     Manual Therapy   Manual Therapy Myofascial release;Passive ROM   Myofascial Release Gentle stretch to lateral trunk inferiorly during abduction stretch   Passive ROM  PROM to left shoulder in direction of flexion, abduction and ER                      Breast Clinic Goals - 05/24/17 1141      Patient will be able to verbalize understanding of pertinent lymphedema risk reduction practices relevant to her diagnosis specifically related to skin care.   Time 1   Period Days   Status Achieved     Patient will be able to return demonstrate and/or verbalize understanding of the post-op home exercise program related to regaining shoulder range of motion.   Time 1   Period Days   Status Achieved     Patient will be able to verbalize understanding of the importance of attending the postoperative After Breast Cancer Class for further lymphedema risk reduction education and therapeutic exercise.   Time 1   Period Days   Status Achieved          Long Term Clinic  Goals - 09/12/17 1654      CC Long Term Goal  #1   Title Patient will demonstrate a home exercise program for improving shoulder ROM.   Time 4   Period Weeks   Status New     CC Long Term Goal  #2   Title Patient will increase left shoulder flexion and abduction ROM to be >/= 150 degrees for increased ease reaching.   Baseline At time of re-eval on 09/12/17, left shoulder flexion 119 and abduction 98.   Time 4   Period Weeks   Status New     CC Long Term Goal  #3   Title Left shoulder external rotation to at least 85 degrees for improved ADLs.   Baseline 40 degrees at re-eval on 09/12/17   Time 4   Period Weeks   Status New     CC Long Term Goal  #4   Title Left shoulder pain decreased at least 60% with activity.   Baseline up to 8/10 at re-eval on 09/12/17   Time 4   Period Weeks   Status New            Plan - 09/25/17 1017    Clinical Impression Statement PRogressed strength to include 3 way raises with 1 lb weight which pt tolerated well except reporting some pain/tightness with end ROM abduction so had her limit ROM which decreased pain. She reports overall being able to to tell she is healing from the surgery and ROM improves each week.    Rehab Potential Excellent   Clinical Impairments Affecting Rehab Potential Chronic condition   PT Frequency 2x / week   PT Duration 4 weeks   PT Treatment/Interventions ADLs/Self Care Home Management;Electrical Stimulation;Therapeutic exercise;Patient/family education;Manual techniques;Manual lymph drainage;Passive range of motion;Scar mobilization   PT Next Visit Plan Remeasure ROM; Continue P/AA/A/ROM.  Update home exercise program, including doorway stretch.   Consulted and Agree with Plan of Care Patient      Patient will benefit from skilled therapeutic intervention in order to improve the following deficits and impairments:  Decreased range of motion, Impaired UE functional use, Pain, Decreased strength  Visit  Diagnosis: Stiffness of left shoulder, not elsewhere classified  Acute pain of left shoulder  Abnormal posture  Chronic left shoulder pain     Problem List Patient Active Problem List   Diagnosis Date Noted  . Cancer of left breast, stage 2 ([place]) 08/03/2017  . Genetic testing 06/08/2017  . Family history of  breast cancer   . Family history of prostate cancer   . Family history of thyroid cancer   . Malignant neoplasm of upper-outer quadrant of left breast in female, estrogen receptor positive ([place]) 05/24/2017  . PERIPHERAL NEUROPATHY 03/15/2010  . RENAL CYST 03/15/2010  . FATIGUE 03/15/2010  . NAUSEA 03/15/2010  . ABDOMINAL BLOATING 03/15/2010  . ABDOMINAL PAIN, UNSPECIFIED SITE 03/15/2010    Otelia Limes, PTA 09/25/2017, 10:20 AM  [place], [place], [address] Phone: [phone]   Fax:  [phone]  Name: HADESSAH GRENNAN MRN: [id number] Date of Birth: 10-14-1956

## 2017-09-29 ENCOUNTER — Ambulatory Visit: Payer: BLUE CROSS/BLUE SHIELD | Admitting: Physical Therapy

## 2017-10-03 ENCOUNTER — Ambulatory Visit: Payer: BLUE CROSS/BLUE SHIELD

## 2017-10-03 DIAGNOSIS — M25512 Pain in left shoulder: Secondary | ICD-10-CM

## 2017-10-03 DIAGNOSIS — M25612 Stiffness of left shoulder, not elsewhere classified: Secondary | ICD-10-CM

## 2017-10-03 DIAGNOSIS — G8929 Other chronic pain: Secondary | ICD-10-CM | POA: Diagnosis not present

## 2017-10-03 DIAGNOSIS — R293 Abnormal posture: Secondary | ICD-10-CM | POA: Diagnosis not present

## 2017-10-03 NOTE — Therapy (Signed)
[place], [place], [address] Phone: [phone]   Fax:  [phone]  Physical Therapy Treatment  Patient Details  Name: Monique [name] MRN: [id number] Date of Birth: 1956/03/10 Referring Provider: Dr. Alphonsa Overall  Encounter Date: 10/03/2017      PT End of Session - 10/03/17 1019    Visit Number 13   Number of Visits 17   Date for PT Re-Evaluation 10/20/17   PT Start Time 0928   PT Stop Time 1017   PT Time Calculation (min) 49 min   Activity Tolerance Patient tolerated treatment well   Behavior During Therapy [facility] for tasks assessed/performed      Past Medical History:  Diagnosis Date  . Abnormal EKG   . Barrett's esophagus   . Complication of anesthesia   . Family history of breast cancer   . Family history of prostate cancer   . Family history of thyroid cancer   . Fatigue   . Generalized headaches   . IBS (irritable bowel syndrome)   . Meniere disease   . PONV (postoperative nausea and vomiting)   . Psoriasis   . Tinnitus    left ear    Past Surgical History:  Procedure Laterality Date  . DILATION AND CURETTAGE OF UTERUS    . MASTECTOMY W/ SENTINEL NODE BIOPSY Left 08/03/2017   Procedure: LEFT MASTECTOMY WITH LEFT AXILLARY SENTINEL LYMPH NODE BIOPSY;  Surgeon: Alphonsa Overall, MD;  Location: [facility];  Service: General;  Laterality: Left;  . skin cancer removal    . TONSILLECTOMY      There were no vitals filed for this visit.      Subjective Assessment - 10/03/17 0930    Subjective My Lt shoulder is actually doing well! I can lift my arm high enough to wash and shave under my arm.    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 It measures 2.9 cm and is located in the upper outer quadrant. A lymph node was biopsied and was negative. Her left  shoulder pain has gone untreated and now her ROM is significantly limited and impacting daily tasks. Had left total mastectomy 08/03/17 with two nodes removed, both negative.  No reconstruction planned.   Patient Stated Goals "I want to get back to where I was before the surgery. I want to get this (left axilla) back to not hurting."   Currently in Pain? No/denies            [facility] PT Assessment - 10/03/17 0001      AROM   Left Shoulder Flexion 141 Degrees   Left Shoulder ABduction 115 Degrees   Left Shoulder Internal Rotation 70 Degrees  With slight compensation   Left Shoulder External Rotation 82 Degrees  No discomfort at all                     [facility] Adult PT Treatment/Exercise - 10/03/17 0001      Shoulder Exercises: Pulleys   Flexion 2 minutes   ABduction 2 minutes     Shoulder Exercises: ROM/Strengthening   Other ROM/Strengthening Exercises Bil UE 3 way raises with 2 lbs x10 each against wall, pain with abduction so did 1 lb and had no pain with this today.     Manual Therapy   Manual Therapy Myofascial release;Passive ROM   Myofascial  Release Gentle stretch to lateral trunk inferiorly during abduction stretch   Passive ROM PROM to left shoulder in direction of flexion, abduction and ER                      Long Term Clinic Goals - 10/03/17 1018      CC Long Term Goal  #1   Title Patient will demonstrate a home exercise program for improving shoulder ROM.   Baseline Pt independent with HEP at this time but plan toprogress this next visit-10/03/17   Status Partially Met     CC Long Term Goal  #2   Title Patient will increase left shoulder flexion and abduction ROM to be >/= 150 degrees for increased ease reaching.   Baseline At time of re-eval on 09/12/17, left shoulder flexion 119 and abduction 98; flexion 141 and abduction 115 degrees-10/03/17   Status On-going     CC Long Term Goal  #3   Title Left shoulder external rotation to at  least 85 degrees for improved ADLs.   Baseline 40 degrees at re-eval on 09/12/17; 82 degrees-10/03/17   Status On-going     CC Long Term Goal  #4   Title Left shoulder pain decreased at least 60% with activity.   Baseline up to 8/10 at re-eval on 09/12/17; 70-80% improvement reported at this time-10/03/17   Status Achieved            Plan - 10/03/17 1020    Clinical Impression Statement Pt is progressing overall very well and showing good progress towards goals with improvement with her A/ROM and has met pain goal.    Rehab Potential Excellent   Clinical Impairments Affecting Rehab Potential Chronic condition   PT Frequency 2x / week   PT Duration 4 weeks   PT Treatment/Interventions ADLs/Self Care Home Management;Electrical Stimulation;Therapeutic exercise;Patient/family education;Manual techniques;Manual lymph drainage;Passive range of motion;Scar mobilization   PT Next Visit Plan Instruct in Strength ABC Program and then once independent with this resume manual stretching.   Consulted and Agree with Plan of Care Patient      Patient will benefit from skilled therapeutic intervention in order to improve the following deficits and impairments:  Decreased range of motion, Impaired UE functional use, Pain, Decreased strength  Visit Diagnosis: Stiffness of left shoulder, not elsewhere classified  Acute pain of left shoulder  Abnormal posture  Chronic left shoulder pain     Problem List Patient Active Problem List   Diagnosis Date Noted  . Cancer of left breast, stage 2 ([place]) 08/03/2017  . Genetic testing 06/08/2017  . Family history of breast cancer   . Family history of prostate cancer   . Family history of thyroid cancer   . Malignant neoplasm of upper-outer quadrant of left breast in female, estrogen receptor positive ([place]) 05/24/2017  . PERIPHERAL NEUROPATHY 03/15/2010  . RENAL CYST 03/15/2010  . FATIGUE 03/15/2010  . NAUSEA 03/15/2010  . ABDOMINAL BLOATING  03/15/2010  . ABDOMINAL PAIN, UNSPECIFIED SITE 03/15/2010    Otelia Limes, PTA 10/03/2017, 10:21 AM  [place], [place], [address] Phone: [phone]   Fax:  [phone]  Name: Monique [name] MRN: [id number] Date of Birth: 1956-09-14

## 2017-10-05 ENCOUNTER — Ambulatory Visit: Payer: BLUE CROSS/BLUE SHIELD

## 2017-10-05 DIAGNOSIS — G8929 Other chronic pain: Secondary | ICD-10-CM

## 2017-10-05 DIAGNOSIS — M25512 Pain in left shoulder: Secondary | ICD-10-CM | POA: Diagnosis not present

## 2017-10-05 DIAGNOSIS — M25612 Stiffness of left shoulder, not elsewhere classified: Secondary | ICD-10-CM

## 2017-10-05 DIAGNOSIS — R293 Abnormal posture: Secondary | ICD-10-CM

## 2017-10-05 NOTE — Therapy (Signed)
[place], [place], [address] Phone: [phone]   Fax:  [phone]  Physical Therapy Treatment  Patient Details  Name: Monique [name] MRN: [id number] Date of Birth: 10/18/1956 Referring Provider: Dr. Alphonsa Overall  Encounter Date: 10/05/2017      PT End of Session - 10/05/17 0932    Visit Number 14   Number of Visits 17   Date for PT Re-Evaluation 10/20/17   PT Start Time 0851   PT Stop Time 0932   PT Time Calculation (min) 41 min   Activity Tolerance Patient tolerated treatment well   Behavior During Therapy [facility] for tasks assessed/performed      Past Medical History:  Diagnosis Date  . Abnormal EKG   . Barrett's esophagus   . Complication of anesthesia   . Family history of breast cancer   . Family history of prostate cancer   . Family history of thyroid cancer   . Fatigue   . Generalized headaches   . IBS (irritable bowel syndrome)   . Meniere disease   . PONV (postoperative nausea and vomiting)   . Psoriasis   . Tinnitus    left ear    Past Surgical History:  Procedure Laterality Date  . DILATION AND CURETTAGE OF UTERUS    . MASTECTOMY W/ SENTINEL NODE BIOPSY Left 08/03/2017   Procedure: LEFT MASTECTOMY WITH LEFT AXILLARY SENTINEL LYMPH NODE BIOPSY;  Surgeon: Alphonsa Overall, MD;  Location: [facility];  Service: General;  Laterality: Left;  . skin cancer removal    . TONSILLECTOMY      There were no vitals filed for this visit.      Subjective Assessment - 10/05/17 0859    Subjective Nothing new, Lt shoulder is feeling great.    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 It measures 2.9 cm and is located in the upper outer quadrant. A lymph node was biopsied and was negative. Her left shoulder pain has gone untreated and now her ROM is  significantly limited and impacting daily tasks. Had left total mastectomy 08/03/17 with two nodes removed, both negative.  No reconstruction planned.   Patient Stated Goals "I want to get back to where I was before the surgery. I want to get this (left axilla) back to not hurting."   Currently in Pain? No/denies                         [facility] Adult PT Treatment/Exercise - 10/05/17 0001      Shoulder Exercises: Pulleys   Flexion 2 minutes   ABduction 2 minutes     Shoulder Exercises: Therapy Ball   ABduction 10 reps  Lt UE with side lean into end of stretch, 1 lb on wrist     Shoulder Exercises: ROM/Strengthening   Other ROM/Strengthening Exercises Instruction in Strength ABC Program which she was able to return correct demonstration of with minor cuing for correct techniques, and used 2 lb weights for UE portions.                      [facility] Goals - 10/03/17 1018      CC Long Term Goal  #1   Title Patient will demonstrate a home exercise program for improving shoulder ROM.   Baseline Pt independent with  HEP at this time but plan toprogress this next visit-10/03/17   Status Partially Met     CC Long Term Goal  #2   Title Patient will increase left shoulder flexion and abduction ROM to be >/= 150 degrees for increased ease reaching.   Baseline At time of re-eval on 09/12/17, left shoulder flexion 119 and abduction 98; flexion 141 and abduction 115 degrees-10/03/17   Status On-going     CC Long Term Goal  #3   Title Left shoulder external rotation to at least 85 degrees for improved ADLs.   Baseline 40 degrees at re-eval on 09/12/17; 82 degrees-10/03/17   Status On-going     CC Long Term Goal  #4   Title Left shoulder pain decreased at least 60% with activity.   Baseline up to 8/10 at re-eval on 09/12/17; 70-80% improvement reported at this time-10/03/17   Status Achieved            Plan - 10/05/17 0933    Clinical Impression  Statement Instructed pt in Strength ABC Packet which she did very well with tolerating exercises well with no increased pain. She is to try this once before returning for next visit next Tuesday.    Rehab Potential Excellent   Clinical Impairments Affecting Rehab Potential Chronic condition   PT Frequency 2x / week   PT Duration 4 weeks   PT Treatment/Interventions ADLs/Self Care Home Management;Electrical Stimulation;Therapeutic exercise;Patient/family education;Manual techniques;Manual lymph drainage;Passive range of motion;Scar mobilization   PT Next Visit Plan Review prn Strength ABC Program and then once independent with this resume manual stretching.   Consulted and Agree with Plan of Care Patient      Patient will benefit from skilled therapeutic intervention in order to improve the following deficits and impairments:  Decreased range of motion, Impaired UE functional use, Pain, Decreased strength  Visit Diagnosis: Stiffness of left shoulder, not elsewhere classified  Acute pain of left shoulder  Abnormal posture  Chronic left shoulder pain     Problem List Patient Active Problem List   Diagnosis Date Noted  . Cancer of left breast, stage 2 ([place]) 08/03/2017  . Genetic testing 06/08/2017  . Family history of breast cancer   . Family history of prostate cancer   . Family history of thyroid cancer   . Malignant neoplasm of upper-outer quadrant of left breast in female, estrogen receptor positive ([place]) 05/24/2017  . PERIPHERAL NEUROPATHY 03/15/2010  . RENAL CYST 03/15/2010  . FATIGUE 03/15/2010  . NAUSEA 03/15/2010  . ABDOMINAL BLOATING 03/15/2010  . ABDOMINAL PAIN, UNSPECIFIED SITE 03/15/2010    Otelia Limes, PTA 10/05/2017, 9:34 AM  [place], [place], [address] Phone: [phone]   Fax:  [phone]  Name: JALAYLA CHRISMER MRN: [id number] Date of Birth: 06/30/1956

## 2017-10-10 ENCOUNTER — Ambulatory Visit: Payer: BLUE CROSS/BLUE SHIELD

## 2017-10-10 DIAGNOSIS — M25512 Pain in left shoulder: Secondary | ICD-10-CM | POA: Diagnosis not present

## 2017-10-10 DIAGNOSIS — M25612 Stiffness of left shoulder, not elsewhere classified: Secondary | ICD-10-CM | POA: Diagnosis not present

## 2017-10-10 DIAGNOSIS — G8929 Other chronic pain: Secondary | ICD-10-CM | POA: Diagnosis not present

## 2017-10-10 DIAGNOSIS — R293 Abnormal posture: Secondary | ICD-10-CM | POA: Diagnosis not present

## 2017-10-10 NOTE — Therapy (Signed)
[place], [place], [address] Phone: [phone]   Fax:  [phone]  Physical Therapy Treatment  Patient Details  Name: Monique [name] MRN: [id number] Date of Birth: Aug 29, 1956 Referring Provider: Dr. Alphonsa [name]  Encounter Date: 10/10/2017      PT End of Session - 10/10/17 1016    Visit Number 15   Number of Visits 17   Date for PT Re-Evaluation 10/20/17   PT Start Time 0930   PT Stop Time 1015   PT Time Calculation (min) 45 min   Activity Tolerance Patient tolerated treatment well   Behavior During Therapy [name] for tasks assessed/performed      Past Medical History:  Diagnosis Date  . Abnormal EKG   . Barrett's esophagus   . Complication of anesthesia   . Family history of breast cancer   . Family history of prostate cancer   . Family history of thyroid cancer   . Fatigue   . Generalized headaches   . IBS (irritable bowel syndrome)   . Meniere disease   . PONV (postoperative nausea and vomiting)   . Psoriasis   . Tinnitus    left ear    Past Surgical History:  Procedure Laterality Date  . DILATION AND CURETTAGE OF UTERUS    . MASTECTOMY W/ SENTINEL NODE BIOPSY Left 08/03/2017   Procedure: LEFT MASTECTOMY WITH LEFT AXILLARY SENTINEL LYMPH NODE BIOPSY;  Surgeon: Monique Overall, MD;  Location: [facility];  Service: General;  Laterality: Left;  . skin cancer removal    . TONSILLECTOMY      There were no vitals filed for this visit.      Subjective Assessment - 10/10/17 0933    Subjective My Lt trunk really started bothering me over the weekend so I made an appointment with the surgeon for today. I had to take a Vicodin it was so bad. My Lt shoulder is still doing well though.    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 It measures 2.9 cm and is  located in the upper outer quadrant. A lymph node was biopsied and was negative. Her left shoulder pain has gone untreated and now her ROM is significantly limited and impacting daily tasks. Had left total mastectomy 08/03/17 with two nodes removed, both negative.  No reconstruction planned.   Patient Stated Goals "I want to get back to where I was before the surgery. I want to get this (left axilla) back to not hurting."   Currently in Pain? Yes   Pain Score 7    Pain Location Flank   Pain Orientation Left   Pain Descriptors / Indicators Constant;Dull   Pain Type Surgical pain   Pain Onset More than a month ago   Pain Frequency Constant   Aggravating Factors  I don't know what flared it up   Pain Relieving Factors Vicodin                         [facility] Adult PT Treatment/Exercise - 10/10/17 0001      Shoulder Exercises: Pulleys   Flexion 2 minutes   ABduction 2 minutes     Shoulder Exercises: Therapy Ball   Flexion 10 reps  With forward lean into end of stretch   ABduction 10 reps  Lt UE with side lean into end  of stretch     Manual Therapy   Manual Therapy Myofascial release;Passive ROM   Myofascial Release Gentle stretch to lateral trunk inferiorly during abduction stretch   Passive ROM PROM to left shoulder in direction of flexion, abduction and ER                      Breast Clinic Goals - 05/24/17 1141      Patient will be able to verbalize understanding of pertinent lymphedema risk reduction practices relevant to her diagnosis specifically related to skin care.   Time 1   Period Days   Status Achieved     Patient will be able to return demonstrate and/or verbalize understanding of the post-op home exercise program related to regaining shoulder range of motion.   Time 1   Period Days   Status Achieved     Patient will be able to verbalize understanding of the importance of attending the postoperative After Breast Cancer Class for further  lymphedema risk reduction education and therapeutic exercise.   Time 1   Period Days   Status Achieved          Long Term Clinic Goals - 10/03/17 1018      CC Long Term Goal  #1   Title Patient will demonstrate a home exercise program for improving shoulder ROM.   Baseline Pt independent with HEP at this time but plan toprogress this next visit-10/03/17   Status Partially Met     CC Long Term Goal  #2   Title Patient will increase left shoulder flexion and abduction ROM to be >/= 150 degrees for increased ease reaching.   Baseline At time of re-eval on 09/12/17, left shoulder flexion 119 and abduction 98; flexion 141 and abduction 115 degrees-10/03/17   Status On-going     CC Long Term Goal  #3   Title Left shoulder external rotation to at least 85 degrees for improved ADLs.   Baseline 40 degrees at re-eval on 09/12/17; 82 degrees-10/03/17   Status On-going     CC Long Term Goal  #4   Title Left shoulder pain decreased at least 60% with activity.   Baseline up to 8/10 at re-eval on 09/12/17; 70-80% improvement reported at this time-10/03/17   Status Achieved            Plan - 10/10/17 1016    Clinical Impression Statement Pt didn't try Strength ABC Program over weekend due to having increased pain at her Lt lateral trunk. She reported this feeing better after stretching today. Will address new pain with surgeon (Dr. Lucia [name]) at her appt this afternoon.    Rehab Potential Excellent   Clinical Impairments Affecting Rehab Potential Chronic condition   PT Frequency 2x / week   PT Duration 4 weeks   PT Treatment/Interventions ADLs/Self Care Home Management;Electrical Stimulation;Therapeutic exercise;Patient/family education;Manual techniques;Manual lymph drainage;Passive range of motion;Scar mobilization   PT Next Visit Plan Review prn Strength ABC Program and then once independent with this resume manual stretching.   Consulted and Agree with Plan of Care Patient      Patient  will benefit from skilled therapeutic intervention in order to improve the following deficits and impairments:  Decreased range of motion, Impaired UE functional use, Pain, Decreased strength  Visit Diagnosis: Stiffness of left shoulder, not elsewhere classified  Acute pain of left shoulder  Abnormal posture  Chronic left shoulder pain     Problem List Patient Active Problem List   Diagnosis  Date Noted  . Cancer of left breast, stage 2 ([place]) 08/03/2017  . Genetic testing 06/08/2017  . Family history of breast cancer   . Family history of prostate cancer   . Family history of thyroid cancer   . Malignant neoplasm of upper-outer quadrant of left breast in female, estrogen receptor positive ([place]) 05/24/2017  . PERIPHERAL NEUROPATHY 03/15/2010  . RENAL CYST 03/15/2010  . FATIGUE 03/15/2010  . NAUSEA 03/15/2010  . ABDOMINAL BLOATING 03/15/2010  . ABDOMINAL PAIN, UNSPECIFIED SITE 03/15/2010    Otelia Limes, PTA 10/10/2017, 10:20 AM  [place], [place], [address] Phone: [phone]   Fax:  [phone]  Name: Monique [name] MRN: [id number] Date of Birth: [birth year]

## 2017-10-12 ENCOUNTER — Ambulatory Visit: Payer: BLUE CROSS/BLUE SHIELD

## 2017-10-12 DIAGNOSIS — M25612 Stiffness of left shoulder, not elsewhere classified: Secondary | ICD-10-CM

## 2017-10-12 DIAGNOSIS — M25512 Pain in left shoulder: Secondary | ICD-10-CM | POA: Diagnosis not present

## 2017-10-12 DIAGNOSIS — G8929 Other chronic pain: Secondary | ICD-10-CM

## 2017-10-12 DIAGNOSIS — R293 Abnormal posture: Secondary | ICD-10-CM

## 2017-10-12 NOTE — Therapy (Signed)
[place], [place], [address] Phone: [phone]   Fax:  [phone]  Physical Therapy Treatment  Patient Details  Name: Monique [name] MRN: [id number] Date of Birth: 1956/10/29 Referring Provider: Dr. Alphonsa Overall  Encounter Date: 10/12/2017      PT End of Session - 10/12/17 1004    Visit Number 16   Number of Visits 17   Date for PT Re-Evaluation 10/20/17   PT Start Time 0938   PT Stop Time 1017   PT Time Calculation (min) 39 min   Activity Tolerance Patient tolerated treatment well   Behavior During Therapy [facility] for tasks assessed/performed      Past Medical History:  Diagnosis Date  . Abnormal EKG   . Barrett's esophagus   . Complication of anesthesia   . Family history of breast cancer   . Family history of prostate cancer   . Family history of thyroid cancer   . Fatigue   . Generalized headaches   . IBS (irritable bowel syndrome)   . Meniere disease   . PONV (postoperative nausea and vomiting)   . Psoriasis   . Tinnitus    left ear    Past Surgical History:  Procedure Laterality Date  . DILATION AND CURETTAGE OF UTERUS    . MASTECTOMY W/ SENTINEL NODE BIOPSY Left 08/03/2017   Procedure: LEFT MASTECTOMY WITH LEFT AXILLARY SENTINEL LYMPH NODE BIOPSY;  Surgeon: Alphonsa Overall, MD;  Location: [place];  Service: General;  Laterality: Left;  . skin cancer removal    . TONSILLECTOMY      There were no vitals filed for this visit.      Subjective Assessment - 10/12/17 0954    Subjective I saw Dr. Lucia Gaskins and he said I was healing really well. He wants me to take Tamoxifen and wants me to see an oncologist (I didn't go to my last appt with Dr. Lindi Adie since I didn't want to see him anymore) so Dr. Lucia Gaskins set me up an appt but not sure who with. And whatever you did with me last time it really helped!   Pertinent History Patient was diagnosed on 05/09/17 with left  invasive ductal carcinoma breast cancer. It is ER/PR positive and HER2 negative with a Ki67 of 15%. It measures 2.9 cm and is located in the upper outer quadrant. A lymph node was biopsied and was negative. Her left shoulder pain has gone untreated and now her ROM is significantly limited and impacting daily tasks. Had left total mastectomy 08/03/17 with two nodes removed, both negative.  No reconstruction planned.   Patient Stated Goals "I want to get back to where I was before the surgery. I want to get this (left axilla) back to not hurting."   Currently in Pain? No/denies            [facility] PT Assessment - 10/12/17 0001      AROM   Left Shoulder Flexion 148 Degrees   Left Shoulder ABduction 117 Degrees                     [facility] Adult PT Treatment/Exercise - 10/12/17 0001      Shoulder Exercises: Pulleys   Flexion 2 minutes   ABduction 2 minutes     Shoulder Exercises: Therapy Ball   Flexion 10 reps  With forward lean into end of stretch and 1 lb on wrists   ABduction 10 reps  Lt UE  with side lean into end of stretch with 1 lb on wrist     Shoulder Exercises: ROM/Strengthening   "W" Arms 5 times with 3 sec holds against wall, very challenging for pt   Other ROM/Strengthening Exercises Bil 3 way raises with 1 lb on wrists 10 times each.      Shoulder Exercises: Stretch   Corner Stretch 3 reps;10 seconds  In doorway     Manual Therapy   Manual Therapy Myofascial release;Passive ROM   Myofascial Release Gentle stretch to lateral trunk inferiorly during abduction stretch   Passive ROM PROM to left shoulder in direction of flexion, abduction and ER                      Breast Clinic Goals - 05/24/17 1141      Patient will be able to verbalize understanding of pertinent lymphedema risk reduction practices relevant to her diagnosis specifically related to skin care.   Time 1   Period Days   Status Achieved     Patient will be able to return  demonstrate and/or verbalize understanding of the post-op home exercise program related to regaining shoulder range of motion.   Time 1   Period Days   Status Achieved     Patient will be able to verbalize understanding of the importance of attending the postoperative After Breast Cancer Class for further lymphedema risk reduction education and therapeutic exercise.   Time 1   Period Days   Status Achieved          Long Term Clinic Goals - 10/03/17 1018      CC Long Term Goal  #1   Title Patient will demonstrate a home exercise program for improving shoulder ROM.   Baseline Pt independent with HEP at this time but plan toprogress this next visit-10/03/17   Status Partially Met     CC Long Term Goal  #2   Title Patient will increase left shoulder flexion and abduction ROM to be >/= 150 degrees for increased ease reaching.   Baseline At time of re-eval on 09/12/17, left shoulder flexion 119 and abduction 98; flexion 141 and abduction 115 degrees-10/03/17   Status On-going     CC Long Term Goal  #3   Title Left shoulder external rotation to at least 85 degrees for improved ADLs.   Baseline 40 degrees at re-eval on 09/12/17; 82 degrees-10/03/17   Status On-going     CC Long Term Goal  #4   Title Left shoulder pain decreased at least 60% with activity.   Baseline up to 8/10 at re-eval on 09/12/17; 70-80% improvement reported at this time-10/03/17   Status Achieved            Plan - 10/12/17 1018    Clinical Impression Statement Pt tolerated strtching and P/ROM well today and had a good report from her surgeon Tuesday. She is progressing well and will be ready to D/C end of next week.    Rehab Potential Excellent   Clinical Impairments Affecting Rehab Potential Chronic condition   PT Frequency 2x / week   PT Duration 4 weeks   PT Treatment/Interventions ADLs/Self Care Home Management;Electrical Stimulation;Therapeutic exercise;Patient/family education;Manual techniques;Manual  lymph drainage;Passive range of motion;Scar mobilization   PT Next Visit Plan Review prn Strength ABC Program and then once independent with this resume manual stretching. D/C end of next week.    Consulted and Agree with Plan of Care Patient  Patient will benefit from skilled therapeutic intervention in order to improve the following deficits and impairments:  Decreased range of motion, Impaired UE functional use, Pain, Decreased strength  Visit Diagnosis: Stiffness of left shoulder, not elsewhere classified  Acute pain of left shoulder  Abnormal posture  Chronic left shoulder pain     Problem List Patient Active Problem List   Diagnosis Date Noted  . Cancer of left breast, stage 2 ([place]) 08/03/2017  . Genetic testing 06/08/2017  . Family history of breast cancer   . Family history of prostate cancer   . Family history of thyroid cancer   . Malignant neoplasm of upper-outer quadrant of left breast in female, estrogen receptor positive ([place]) 05/24/2017  . PERIPHERAL NEUROPATHY 03/15/2010  . RENAL CYST 03/15/2010  . FATIGUE 03/15/2010  . NAUSEA 03/15/2010  . ABDOMINAL BLOATING 03/15/2010  . ABDOMINAL PAIN, UNSPECIFIED SITE 03/15/2010    Otelia Limes, PTA 10/12/2017, 10:20 AM  [place], [place], [address] Phone: [phone]   Fax:  [phone]  Name: Monique [name] MRN: [id number] Date of Birth: [birth year]

## 2017-10-17 ENCOUNTER — Ambulatory Visit: Payer: BLUE CROSS/BLUE SHIELD

## 2017-10-17 DIAGNOSIS — R293 Abnormal posture: Secondary | ICD-10-CM | POA: Diagnosis not present

## 2017-10-17 DIAGNOSIS — M25612 Stiffness of left shoulder, not elsewhere classified: Secondary | ICD-10-CM | POA: Diagnosis not present

## 2017-10-17 DIAGNOSIS — G8929 Other chronic pain: Secondary | ICD-10-CM

## 2017-10-17 DIAGNOSIS — M25512 Pain in left shoulder: Secondary | ICD-10-CM | POA: Diagnosis not present

## 2017-10-17 NOTE — Therapy (Signed)
[name], [place], [address] Phone: [phone]   Fax:  [phone]  Physical Therapy Treatment  Patient Details  Name: CHANEY INGRAM MRN: [id number] Date of Birth: Oct 26, 1956 Referring Provider: Dr. Alphonsa Overall  Encounter Date: 10/17/2017      PT End of Session - 10/17/17 1021    Visit Number 17   Number of Visits 17   Date for PT Re-Evaluation 10/20/17   PT Start Time 0941   PT Stop Time 1021   PT Time Calculation (min) 40 min   Activity Tolerance Patient tolerated treatment well   Behavior During Therapy [facility] for tasks assessed/performed      Past Medical History:  Diagnosis Date  . Abnormal EKG   . Barrett's esophagus   . Complication of anesthesia   . Family history of breast cancer   . Family history of prostate cancer   . Family history of thyroid cancer   . Fatigue   . Generalized headaches   . IBS (irritable bowel syndrome)   . Meniere disease   . PONV (postoperative nausea and vomiting)   . Psoriasis   . Tinnitus    left ear    Past Surgical History:  Procedure Laterality Date  . DILATION AND CURETTAGE OF UTERUS    . MASTECTOMY W/ SENTINEL NODE BIOPSY Left 08/03/2017   Procedure: LEFT MASTECTOMY WITH LEFT AXILLARY SENTINEL LYMPH NODE BIOPSY;  Surgeon: Alphonsa Overall, MD;  Location: [place];  Service: General;  Laterality: Left;  . skin cancer removal    . TONSILLECTOMY      There were no vitals filed for this visit.      Subjective Assessment - 10/17/17 0948    Subjective I've been feeling really good, what you've been doing for my incision has been really good.    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 It measures 2.9 cm and is located in the upper outer quadrant. A lymph node was biopsied and was negative. Her left shoulder pain  has gone untreated and now her ROM is significantly limited and impacting daily tasks. Had left total mastectomy 08/03/17 with two nodes removed, both negative.  No reconstruction planned.   Patient Stated Goals "I want to get back to where I was before the surgery. I want to get this (left axilla) back to not hurting."   Currently in Pain? No/denies                         [facility] Adult PT Treatment/Exercise - 10/17/17 0001      Shoulder Exercises: Pulleys   Flexion 2 minutes   Flexion Limitations VCs to remind pt to decrease Lt shoulder hike   ABduction 2 minutes     Shoulder Exercises: Therapy Ball   Flexion 10 reps  With forward lean into end of stretch   ABduction 10 reps  Lt UE with side lean into end of stretch     Manual Therapy   Manual Therapy Myofascial release;Passive ROM   Myofascial Release Gentle stretch to lateral trunk inferiorly during abduction stretch   Passive ROM PROM to left shoulder in direction of flexion, abduction and ER                      Long Term Clinic Goals - 10/03/17  [place] Term Goal  #1   Title Patient will demonstrate a home exercise program for improving shoulder ROM.   Baseline Pt independent with HEP at this time but plan toprogress this next visit-10/03/17   Status Partially Met     CC Long Term Goal  #2   Title Patient will increase left shoulder flexion and abduction ROM to be >/= 150 degrees for increased ease reaching.   Baseline At time of re-eval on 09/12/17, left shoulder flexion 119 and abduction 98; flexion 141 and abduction 115 degrees-10/03/17   Status On-going     CC Long Term Goal  #3   Title Left shoulder external rotation to at least 85 degrees for improved ADLs.   Baseline 40 degrees at re-eval on 09/12/17; 82 degrees-10/03/17   Status On-going     CC Long Term Goal  #4   Title Left shoulder pain decreased at least 60% with activity.   Baseline up to 8/10 at re-eval on 09/12/17;  70-80% improvement reported at this time-10/03/17   Status Achieved            Plan - 10/17/17 1021    Clinical Impression Statement Pt continues to report great improvement with reduction of tightness at incision and increased A/ROM. Pt has one more visit left.   Rehab Potential Excellent   Clinical Impairments Affecting Rehab Potential Chronic condition   PT Frequency 2x / week   PT Duration 4 weeks   PT Treatment/Interventions ADLs/Self Care Home Management;Electrical Stimulation;Therapeutic exercise;Patient/family education;Manual techniques;Manual lymph drainage;Passive range of motion;Scar mobilization   PT Next Visit Plan D/C next visit; reassess goals   Consulted and Agree with Plan of Care Patient      Patient will benefit from skilled therapeutic intervention in order to improve the following deficits and impairments:  Decreased range of motion, Impaired UE functional use, Pain, Decreased strength  Visit Diagnosis: Stiffness of left shoulder, not elsewhere classified  Acute pain of left shoulder  Abnormal posture  Chronic left shoulder pain     Problem List Patient Active Problem List   Diagnosis Date Noted  . Cancer of left breast, stage 2 ([place]) 08/03/2017  . Genetic testing 06/08/2017  . Family history of breast cancer   . Family history of prostate cancer   . Family history of thyroid cancer   . Malignant neoplasm of upper-outer quadrant of left breast in female, estrogen receptor positive ([place]) 05/24/2017  . PERIPHERAL NEUROPATHY 03/15/2010  . RENAL CYST 03/15/2010  . FATIGUE 03/15/2010  . NAUSEA 03/15/2010  . ABDOMINAL BLOATING 03/15/2010  . ABDOMINAL PAIN, UNSPECIFIED SITE 03/15/2010    Otelia Limes, PTA 10/17/2017, 10:23 AM  [place], [place], [address] Phone: [phone]   Fax:  [phone]  Name: AUBRIA VANECEK MRN: [id number] Date of Birth:  07-11-1956

## 2017-10-19 ENCOUNTER — Ambulatory Visit: Payer: BLUE CROSS/BLUE SHIELD | Attending: Surgery

## 2017-10-19 DIAGNOSIS — R293 Abnormal posture: Secondary | ICD-10-CM

## 2017-10-19 DIAGNOSIS — G8929 Other chronic pain: Secondary | ICD-10-CM | POA: Diagnosis not present

## 2017-10-19 DIAGNOSIS — M25512 Pain in left shoulder: Secondary | ICD-10-CM | POA: Insufficient documentation

## 2017-10-19 DIAGNOSIS — M25612 Stiffness of left shoulder, not elsewhere classified: Secondary | ICD-10-CM

## 2017-10-19 NOTE — Therapy (Signed)
[place], [place], [address] Phone: [phone]   Fax:  [phone]  Physical Therapy Treatment  Patient Details  Name: Monique [name] MRN: [id number] Date of Birth: 14-Oct-1956 Referring Provider: Dr. Alphonsa Overall  Encounter Date: 10/19/2017      PT End of Session - 10/19/17 1015    Visit Number 18   Number of Visits 17   Date for PT Re-Evaluation 10/20/17   PT Start Time 0932   PT Stop Time 1015   PT Time Calculation (min) 43 min   Activity Tolerance Patient tolerated treatment well   Behavior During Therapy [facility] for tasks assessed/performed      Past Medical History:  Diagnosis Date  . Abnormal EKG   . Barrett's esophagus   . Complication of anesthesia   . Family history of breast cancer   . Family history of prostate cancer   . Family history of thyroid cancer   . Fatigue   . Generalized headaches   . IBS (irritable bowel syndrome)   . Meniere disease   . PONV (postoperative nausea and vomiting)   . Psoriasis   . Tinnitus    left ear    Past Surgical History:  Procedure Laterality Date  . DILATION AND CURETTAGE OF UTERUS    . MASTECTOMY W/ SENTINEL NODE BIOPSY Left 08/03/2017   Procedure: LEFT MASTECTOMY WITH LEFT AXILLARY SENTINEL LYMPH NODE BIOPSY;  Surgeon: Alphonsa Overall, MD;  Location: [place];  Service: General;  Laterality: Left;  . skin cancer removal    . TONSILLECTOMY      There were no vitals filed for this visit.      Subjective Assessment - 10/19/17 0935    Subjective I feel great. I'm ready to D/C and head to [facility]!   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 It measures 2.9 cm and is located in the upper outer quadrant. A lymph node was biopsied and was negative. Her left shoulder pain has gone untreated and now her ROM is  significantly limited and impacting daily tasks. Had left total mastectomy 08/03/17 with two nodes removed, both negative.  No reconstruction planned.   Patient Stated Goals "I want to get back to where I was before the surgery. I want to get this (left axilla) back to not hurting."   Currently in Pain? No/denies            [facility] PT Assessment - 10/19/17 0001      AROM   Left Shoulder Flexion 153 Degrees   Left Shoulder ABduction 151 Degrees   Left Shoulder External Rotation 89 Degrees                     [facility] Adult PT Treatment/Exercise - 10/19/17 0001      Shoulder Exercises: Pulleys   Flexion 2 minutes   ABduction 2 minutes     Shoulder Exercises: Therapy Ball   Flexion 10 reps  With forward lean into end of stretch   ABduction 10 reps  Lt UE with side lean into end of stretch     Manual Therapy   Manual Therapy Myofascial release;Passive ROM   Myofascial Release Gentle stretch to lateral trunk inferiorly during abduction stretch   Passive ROM PROM to left shoulder in direction of flexion, abduction and ER  Long Term Clinic Goals - 10/19/17 0939      CC Long Term Goal  #1   Title Patient will demonstrate a home exercise program for improving shoulder ROM.   Baseline Pt now also has Strength ABC Packet and is independent with all HEP now-10/19/17   Time 4   Period Weeks   Status Achieved     CC Long Term Goal  #2   Title Patient will increase left shoulder flexion and abduction ROM to be >/= 150 degrees for increased ease reaching.   Baseline Today active flexion was 153 and abduction 151 degrees.   Time 4   Period Weeks   Status Achieved     CC Long Term Goal  #3   Title Left shoulder external rotation to at least 85 degrees for improved ADLs.   Baseline Goal met; external rotation was 89 degrees today.   Time 4   Period Weeks   Status Achieved     CC Long Term Goal  #4   Title Left shoulder pain decreased at  least 60% with activity.   Baseline Patient reports pain is 0/10 and has improved 100%.   Time 4   Period Weeks   Status Achieved     CC Long Term Goal  #5   Title Patient will report >/= 25% less difficulty squeezing a shampoo bottle.   Baseline No limitation with this now per patient report.   Time 4   Period Weeks   Status Achieved            Plan - 10/19/17 1016    Clinical Impression Statement Pt has done excellent with therapy and has met all goals and is ready to D/C at this time.    Rehab Potential Excellent   Clinical Impairments Affecting Rehab Potential Chronic condition   PT Frequency 2x / week   PT Duration 4 weeks   PT Treatment/Interventions ADLs/Self Care Home Management;Electrical Stimulation;Therapeutic exercise;Patient/family education;Manual techniques;Manual lymph drainage;Passive range of motion;Scar mobilization   PT Next Visit Plan D/C this visit.    Consulted and Agree with Plan of Care Patient      Patient will benefit from skilled therapeutic intervention in order to improve the following deficits and impairments:  Decreased range of motion, Impaired UE functional use, Pain, Decreased strength  Visit Diagnosis: Stiffness of left shoulder, not elsewhere classified  Acute pain of left shoulder  Abnormal posture  Chronic left shoulder pain     Problem List Patient Active Problem List   Diagnosis Date Noted  . Cancer of left breast, stage 2 (HCC) 08/03/2017  . Genetic testing 06/08/2017  . Family history of breast cancer   . Family history of prostate cancer   . Family history of thyroid cancer   . Malignant neoplasm of upper-outer quadrant of left breast in female, estrogen receptor positive (HCC) 05/24/2017  . PERIPHERAL NEUROPATHY 03/15/2010  . RENAL CYST 03/15/2010  . FATIGUE 03/15/2010  . NAUSEA 03/15/2010  . ABDOMINAL BLOATING 03/15/2010  . ABDOMINAL PAIN, UNSPECIFIED SITE 03/15/2010    PHYSICAL THERAPY DISCHARGE  SUMMARY  Visits from Start of Care: 18  Current functional level related to goals / functional outcomes: All goals met. Patient ready for discharge.   Remaining deficits: None   Education / Equipment: Home exercises.  Plan: Patient agrees to discharge.  Patient goals were met. Patient is being discharged due to meeting the stated rehab goals.  ???? Marti Cooper Smith, PT 10/19/17 10:18 AM         [facility] [address] [place], [place], [address] Phone: [phone]   Fax:  [phone]  Name: Monique [name] MRN: [id number] Date of Birth: 08/06/1956   

## 2017-11-06 ENCOUNTER — Ambulatory Visit: Payer: BLUE CROSS/BLUE SHIELD | Admitting: Hematology and Oncology

## 2017-11-07 DIAGNOSIS — Z853 Personal history of malignant neoplasm of breast: Secondary | ICD-10-CM | POA: Diagnosis not present

## 2017-11-08 DIAGNOSIS — Z853 Personal history of malignant neoplasm of breast: Secondary | ICD-10-CM | POA: Diagnosis not present

## 2017-11-10 ENCOUNTER — Encounter: Payer: BLUE CROSS/BLUE SHIELD | Admitting: Adult Health

## 2017-11-15 ENCOUNTER — Telehealth: Payer: Self-pay | Admitting: *Deleted

## 2017-11-15 NOTE — Telephone Encounter (Signed)
VM received from pt on this RN's VM message stating " I need to see a new oncologist, could you call me back - I am under treatment right now ".  This RN returned call to pt -  Monique [name] states she is currently on Tamoxifen " and I feel awful " , " like I am 61 years old ".  Per discussion and concern with her side effects and possible need to be seen sooner - which could be facilitated by current provider- Monique [name] declined stating "no - I just feel I can't communicate with him ".  Note during conversation Monique [name] stating concerns :  " when I saw the surgeon he told me I probably wouldn't need to take the medication "  " this medicine is like old- my friend has been on it forever "  " I do not take any medications so I don't know - I don't tolerate medications well"  This RN addressed above concerns- as well as when she was hoping to be seen. Monique [name] would like to be seen by another provider before the end of the year " because I have met my deductible"  This note will be forwarded to provider for appropriate request to be scheduled to another MD.

## 2017-11-16 NOTE — Telephone Encounter (Signed)
Please arrange a ref to either Dr.Magrinat or Dr.Feng. Thanks VG

## 2017-11-20 ENCOUNTER — Other Ambulatory Visit: Payer: Self-pay

## 2017-11-20 ENCOUNTER — Encounter: Payer: Self-pay | Admitting: Physician Assistant

## 2017-11-20 ENCOUNTER — Ambulatory Visit (INDEPENDENT_AMBULATORY_CARE_PROVIDER_SITE_OTHER): Payer: BLUE CROSS/BLUE SHIELD | Admitting: Physician Assistant

## 2017-11-20 VITALS — BP 102/70 | HR 72 | Temp 98.6°F | Resp 16 | Ht 59.5 in | Wt 110.2 lb

## 2017-11-20 DIAGNOSIS — Z0000 Encounter for general adult medical examination without abnormal findings: Secondary | ICD-10-CM

## 2017-11-20 DIAGNOSIS — G4700 Insomnia, unspecified: Secondary | ICD-10-CM

## 2017-11-20 DIAGNOSIS — Z1159 Encounter for screening for other viral diseases: Secondary | ICD-10-CM | POA: Diagnosis not present

## 2017-11-20 DIAGNOSIS — Z170 Estrogen receptor positive status [ER+]: Secondary | ICD-10-CM

## 2017-11-20 DIAGNOSIS — Z114 Encounter for screening for human immunodeficiency virus [HIV]: Secondary | ICD-10-CM

## 2017-11-20 DIAGNOSIS — C50412 Malignant neoplasm of upper-outer quadrant of left female breast: Secondary | ICD-10-CM

## 2017-11-20 DIAGNOSIS — K219 Gastro-esophageal reflux disease without esophagitis: Secondary | ICD-10-CM

## 2017-11-20 MED ORDER — PANTOPRAZOLE SODIUM 40 MG PO TBEC: 40.0000 mg | DELAYED_RELEASE_TABLET | Freq: Every day | ORAL | 1 refills | Status: DC

## 2017-11-20 MED ORDER — ALPRAZOLAM 0.5 MG PO TABS: 0.5000 mg | ORAL_TABLET | Freq: Every evening | ORAL | 2 refills | Status: DC | PRN

## 2017-11-20 NOTE — Progress Notes (Signed)
Patient ID: Monique [name] MRN: [id number], DOB: [birth year], 61 y.o. Date of Encounter: 11/20/2017,   Chief Complaint: New Patient, Establish Care, Physical (CPE)  HPI: 61 y.o. y/o female here as a New Patient, Establish Care, Physical (CPE)   She had gone to the [facility] remotely.  Then she had moved to [place] and has recently been living there for 8 years.  Moved back here 2 years ago. She is here to establish care with PCP. Reports that she has the following known medical history:  Reports that she takes a Xanax some nights.  Says that she does not have to take this every night.  Says that she if she has been out working in her garden or doing a lot of physical activity, then we will not need to take medication.  Otherwise she does need Xanax to shut off her brain for her to get sleep at night.  Says then she will sleep from about 11 PM to 7 AM and then will "pop up feeling fine with no side effects ".  Also reports that she was diagnosed with breast cancer.  Says that they had "thought it was stage II but ended up only being stage I and that surgeon thinks he got it all ".  Reports that her oncologist had given her a medication "to shrink the tumor that was called and anastrozole but it made her feel 61 years old and felt horrible" so that was discontinued.  Reports that they changed to tamoxifen.  However reports that she is afraid about possible side effects of this medicine and also does not really understand whether it is necessary to take this so she has not started taking it.  Says that she has been trying to get in touch with the oncologist to further discuss this.  States that her type of cancer did not require/would not have responded to any chemo or radiation. Did not have any chemo or radiation. Had left mastectomy.  Also shows me some medication bottles.  Says that she has gotten UTIs in the past.  However says that the cultures would come back negative but that if she  would take 3 or 4 pills of the Cipro that it would resolve.  Also shows me another medication bottle of nitrofurantoin and said that she was prescribed that in the past as a preventive.  Says that she saw a urologist and had tests and was explained that her bladder was small and the urethra was small and this made her prone to UTIs.(She is extremely petite)  She also reports that 20 years ago she had a duodenal ulcer and was treated with Protonix and that the Protonix worked well and all of those symptoms resolved. She reports that "ever since they put the tube in on her throat for her to have the breast surgery" she has been burping and feeling in tasting acid in her throat having similar symptoms that she did in the past and is wanting another prescription for Protonix.  We were discussing preventive care and I discussed hepatitis screen she reports that when she was age 6 she was hanging around with "the wrong group "and was told that she had hepatitis but does not know what type.  Does not know any details about this.  Does not know if she had any treatment.  She also reports that she had a colonoscopy 10 years ago.  Discussed that even if that colonoscopy was normal that a repeat  colonoscopy would be due now.  She wants to hold off on this now since she has been through so much recently with her breast cancer etc. we will wait and rediscuss this at future visit.  She is fasting to do labs today.  She defers getting a Tdap.  Says that she does not think she really needs this.  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  No other concerns to address no other known history to address.   Review of Systems: Consitutional: No fever, chills, fatigue, night sweats, lymphadenopathy. No significant/unexplained weight changes. Eyes: No visual changes, eye redness, or discharge. ENT/Mouth: No ear pain, sore throat, nasal drainage, or sinus pain. Cardiovascular: No chest  pressure,heaviness, tightness or squeezing, even with exertion. No increased shortness of breath or dyspnea on exertion.No palpitations, edema, orthopnea, PND. Respiratory: No cough, hemoptysis, SOB, or wheezing. Gastrointestinal: No anorexia, dysphagia, reflux, pain, nausea, vomiting, hematemesis, diarrhea, constipation, BRBPR, or melena. Breast:  SEE HPI Genitourinary: No dysuria, hematuria, incontinence, vaginal discharge, pruritis, burning, abnormal bleeding, or pain. Musculoskeletal: No decreased ROM, No joint pain or swelling. No significant pain in neck, back, or extremities. Skin: No rash, pruritis, or concerning lesions. Neurological: No headache, dizziness, syncope, seizures, tremors, memory loss, coordination problems, or paresthesias. Psychological: No anxiety, depression, hallucinations, SI/HI. Endocrine: No polydipsia, polyphagia, polyuria, or known diabetes.No increased fatigue. No palpitations/rapid heart rate. No significant/unexplained weight change. All other systems were reviewed and are otherwise negative.  Past Medical History:  Diagnosis Date  . Abnormal EKG   . Barrett's esophagus   . Complication of anesthesia   . Family history of breast cancer   . Family history of prostate cancer   . Family history of thyroid cancer   . Fatigue   . Generalized headaches   . IBS (irritable bowel syndrome)   . Meniere disease   . PONV (postoperative nausea and vomiting)   . Psoriasis   . Tinnitus    left ear     Past Surgical History:  Procedure Laterality Date  . DILATION AND CURETTAGE OF UTERUS    . MASTECTOMY W/ SENTINEL NODE BIOPSY Left 08/03/2017   Procedure: LEFT MASTECTOMY WITH LEFT AXILLARY SENTINEL LYMPH NODE BIOPSY;  Surgeon: Alphonsa Overall, MD;  Location: [place];  Service: General;  Laterality: Left;  . skin cancer removal    . TONSILLECTOMY      Home Meds:  Outpatient Medications Prior to Visit  Medication Sig Dispense Refill  .  ALPRAZolam (XANAX) 0.5 MG tablet Take 0.5 mg by mouth at bedtime as needed. For anxiety and sleep    . calcium carbonate 1250 MG capsule Take 1,250 mg by mouth 2 (two) times daily with a meal.    . OVER THE COUNTER MEDICATION Take 1 tablet by mouth daily as needed. For dizziness. Medication name unknown per patient.    . tamoxifen (NOLVADEX) 20 MG tablet Take 1 tablet (20 mg total) by mouth daily. (Patient not taking: Reported on 11/20/2017) 90 tablet 3  . HYDROcodone-acetaminophen (NORCO/VICODIN) 5-325 MG tablet Take 1 tablet by mouth every 6 (six) hours as needed for moderate pain.    . traMADol (ULTRAM) 50 MG tablet Take 1 tablet (50 mg total) by mouth every 6 (six) hours as needed (mild pain). (Patient not taking: Reported on 09/12/2017) 15 tablet 0   No facility-administered medications prior to visit.     Allergies:  Allergies  Allergen Reactions  . Bupropion Other (  See Comments)    Hallucinate  . Bupropion Hcl Other (See Comments)    hallucinations  . Codeine Nausea And Vomiting  . Doxycycline Nausea And Vomiting  . Varenicline Tartrate Palpitations    Social History   Socioeconomic History  . Marital status: Married    Spouse name: Not on file  . Number of children: Not on file  . Years of education: Not on file  . Highest education level: Not on file  Social Needs  . Financial resource strain: Not on file  . Food insecurity - worry: Not on file  . Food insecurity - inability: Not on file  . Transportation needs - medical: Not on file  . Transportation needs - non-medical: Not on file  Occupational History  . Not on file  Tobacco Use  . Smoking status: Passive Smoke Exposure - Never Smoker  . Smokeless tobacco: Former Network engineer and Sexual Activity  . Alcohol use: Yes    Comment: occas  . Drug use: Not on file  . Sexual activity: Not on file  Other Topics Concern  . Not on file  Social History Narrative  . Not on file    Family History  Problem Relation  Age of Onset  . Heart attack Father   . Hypertension Father   . Hypertension Mother   . Healthy Maternal Aunt   . Healthy Maternal Uncle   . Breast cancer Paternal Aunt 71       bilateral  . Prostate cancer Paternal Uncle   . Healthy Paternal Aunt   . Thyroid cancer Paternal Uncle   . Healthy Paternal Uncle   . Breast cancer Cousin 12       paternal first cousin  . Breast cancer Cousin        paternal first cousin    Physical Exam: Blood pressure 102/70, pulse 72, temperature 98.6 F (37 C), temperature source Oral, resp. rate 16, height 4' 11.5" (1.511 m), weight 50 kg (110 lb 3.2 oz), SpO2 98 %., Body mass index is 21.89 kg/m. General: Very petite WF. Appears in no acute distress. HEENT: Normocephalic, atraumatic. Conjunctiva pink, sclera non-icteric. Pupils 2 mm constricting to 1 mm, round, regular, and equally reactive to light and accomodation. EOMI. Internal auditory canal clear. TMs with good cone of light and without pathology. Nasal mucosa pink. Nares are without discharge. No sinus tenderness. Oral mucosa pink.  Pharynx without exudate.   Neck: Supple. Trachea midline. No thyromegaly. Full ROM. No lymphadenopathy.No Carotid Bruits. Lungs: Clear to auscultation bilaterally without wheezes, rales, or rhonchi. Breathing is of normal effort and unlabored. Cardiovascular: RRR with S1 S2. No murmurs, rubs, or gallops. Distal pulses 2+ symmetrically. No carotid or abdominal bruits. Breast: Left mastectomy. Abdomen: Soft, non-tender, non-distended with normoactive bowel sounds. No hepatosplenomegaly or masses. No rebound/guarding. No CVA tenderness. No hernias.  Musculoskeletal: Full range of motion and 5/5 strength throughout.  Skin: Warm and moist without erythema, ecchymosis, wounds, or rash. Neuro: A+Ox3. CN II-XII grossly intact. Moves all extremities spontaneously. Full sensation throughout. Normal gait.  Psych:  Responds to questions appropriately with a normal affect.    Assessment/Plan:  61 y.o. y/o female here for CPE  1. Encounter for medical examination to establish care  2. Encounter for preventive health examination  A. Screening Labs: She is fasting to do labs today. - HIV antibody - Hepatitis C antibody - CBC with Differential/Platelet - COMPLETE METABOLIC PANEL WITH GFR - Lipid panel - TSH  B. Pap: Head  and updated as much preventive care as possible today.  However given that she was also a new patient establish care and we had to review all of her history we will have her return for pelvic exam and Pap smear --- will do these at future OV. Also she has recently had full cancer evaluation with oncology.  C. Screening Mammogram: --Recent Breast Cancer--- being managed at oncology.  Recent left mastectomy.  D. DEXA/BMD:  --Discuss closer to age 53  E. Colorectal Cancer Screening: She reports that she had a colonoscopy 10 years ago.  Discussed that even if that colonoscopy was normal that a repeat colonoscopy would be due now.  She wants to hold off on this now since she has been through so much recently with her breast cancer etc. we will wait and rediscuss this at future visit.   F. Immunizations:  Influenza:--- Recommended flu vaccine but she defers. Tetanus:--Recommended tetanus vaccine but she defers.She defers getting a Tdap.  Says that she does not think she really needs this.  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 Pneumococcal:--- Discuss Pneumovax 23 at next visit given her cancer history. Zostavax/ Shingrix: We will discuss at future visit.  General she seems very concerned about taking any medications or doing any immunizations unless absolutely necessary.  Very concerned of risk versus benefit of meds immunizations etc.   3. Insomnia, unspecified type Controlled.  Continue current treatment. - ALPRAZolam (XANAX) 0.5 MG tablet; Take 1 tablet (0.5 mg total) by mouth at bedtime as needed.  For anxiety and sleep  Dispense: 30 tablet; Refill: 2  4. Gastroesophageal reflux disease, esophagitis presence not specified Experiencing symptoms consistent with GERD.  Will add Protonix. - pantoprazole (PROTONIX) 40 MG tablet; Take 1 tablet (40 mg total) by mouth daily.  Dispense: 30 tablet; Refill: 1  5. Need for hepatitis C screening test We were discussing preventive care and I discussed hepatitis screen she reports that when she was age 43 she was hanging around with "the wrong group "and was told that she had hepatitis but does not know what type.  Does not know any details about this.  Does not know if she had any treatment. - Hepatitis C antibody  6. Screening for HIV (human immunodeficiency virus) - HIV antibody  7. Malignant neoplasm of upper-outer quadrant of left breast in female, estrogen receptor positive ([place]) Checked by surgery and oncology.      Signed, [address] [place], [place], [facility] 11/20/2017 11:12 AM

## 2017-11-20 NOTE — Telephone Encounter (Signed)
LOS sent for appointment for transfer of care.

## 2017-11-21 LAB — CBC WITH DIFFERENTIAL/PLATELET
BASOS ABS: 71 {cells}/uL (ref 0–200)
Basophils Relative: 0.9 %
Eosinophils Absolute: 387 cells/uL (ref 15–500)
Eosinophils Relative: 4.9 %
HEMATOCRIT: 41.8 % (ref 35.0–45.0)
Hemoglobin: 14.1 g/dL (ref 11.7–15.5)
LYMPHS ABS: 2923 {cells}/uL (ref 850–3900)
MCH: 29.1 pg (ref 27.0–33.0)
MCHC: 33.7 g/dL (ref 32.0–36.0)
MCV: 86.2 fL (ref 80.0–100.0)
MPV: 10.5 fL (ref 7.5–12.5)
Monocytes Relative: 4.4 %
NEUTROS PCT: 52.8 %
Neutro Abs: 4171 cells/uL (ref 1500–7800)
Platelets: 367 10*3/uL (ref 140–400)
RBC: 4.85 10*6/uL (ref 3.80–5.10)
RDW: 12.2 % (ref 11.0–15.0)
TOTAL LYMPHOCYTE: 37 %
WBC: 7.9 10*3/uL (ref 3.8–10.8)
WBCMIX: 348 {cells}/uL (ref 200–950)

## 2017-11-21 LAB — COMPLETE METABOLIC PANEL WITH GFR
AG Ratio: 1.7 (calc) (ref 1.0–2.5)
ALBUMIN MSPROF: 4.1 g/dL (ref 3.6–5.1)
ALT: 17 U/L (ref 6–29)
AST: 18 U/L (ref 10–35)
Alkaline phosphatase (APISO): 78 U/L (ref 33–130)
BUN / CREAT RATIO: 10 (calc) (ref 6–22)
BUN: 6 mg/dL — AB (ref 7–25)
CALCIUM: 9.3 mg/dL (ref 8.6–10.4)
CO2: 24 mmol/L (ref 20–32)
CREATININE: 0.59 mg/dL (ref 0.50–0.99)
Chloride: 100 mmol/L (ref 98–110)
GFR, EST NON AFRICAN AMERICAN: 100 mL/min/{1.73_m2} (ref >=60)
GFR, Est African American: 115 mL/min/{1.73_m2} (ref >=60)
GLOBULIN: 2.4 g/dL (ref 1.9–3.7)
Glucose, Bld: 79 mg/dL (ref 65–99)
Potassium: 4.5 mmol/L (ref 3.5–5.3)
SODIUM: 136 mmol/L (ref 135–146)
Total Bilirubin: 0.4 mg/dL (ref 0.2–1.2)
Total Protein: 6.5 g/dL (ref 6.1–8.1)

## 2017-11-21 LAB — LIPID PANEL
CHOL/HDL RATIO: 3.1 (calc) (ref <5.0)
Cholesterol: 183 mg/dL (ref <200)
HDL: 59 mg/dL (ref >50)
LDL CHOLESTEROL (CALC): 102 mg/dL — AB
NON-HDL CHOLESTEROL (CALC): 124 mg/dL (ref <130)
Triglycerides: 119 mg/dL (ref <150)

## 2017-11-21 LAB — HIV ANTIBODY (ROUTINE TESTING W REFLEX): HIV 1&2 Ab, 4th Generation: NONREACTIVE

## 2017-11-21 LAB — HEPATITIS C ANTIBODY
Hepatitis C Ab: NONREACTIVE
SIGNAL TO CUT-OFF: 0.01 (ref <1.00)

## 2017-11-21 LAB — TSH: TSH: 1.38 mIU/L (ref 0.40–4.50)

## 2017-11-23 ENCOUNTER — Telehealth: Payer: Self-pay | Admitting: Hematology

## 2017-11-23 NOTE — Telephone Encounter (Signed)
An appt has been scheduled for the pt to see Dr. Burr Medico on 12/19 at 230pm. Pt wanted to transfer her care to another oncologist. She also wanted an appt before the new year because her deductible has been met.

## 2017-12-05 DIAGNOSIS — C50912 Malignant neoplasm of unspecified site of left female breast: Secondary | ICD-10-CM | POA: Diagnosis not present

## 2017-12-06 ENCOUNTER — Ambulatory Visit (HOSPITAL_BASED_OUTPATIENT_CLINIC_OR_DEPARTMENT_OTHER): Payer: BLUE CROSS/BLUE SHIELD | Admitting: Hematology

## 2017-12-06 VITALS — BP 123/74 | HR 69 | Temp 98.3°F | Resp 20 | Ht 59.5 in | Wt 110.9 lb

## 2017-12-06 DIAGNOSIS — H8109 Meniere's disease, unspecified ear: Secondary | ICD-10-CM

## 2017-12-06 DIAGNOSIS — K219 Gastro-esophageal reflux disease without esophagitis: Secondary | ICD-10-CM | POA: Diagnosis not present

## 2017-12-06 DIAGNOSIS — Z170 Estrogen receptor positive status [ER+]: Secondary | ICD-10-CM

## 2017-12-06 DIAGNOSIS — C50412 Malignant neoplasm of upper-outer quadrant of left female breast: Secondary | ICD-10-CM

## 2017-12-06 DIAGNOSIS — F418 Other specified anxiety disorders: Secondary | ICD-10-CM

## 2017-12-06 MED ORDER — VENLAFAXINE HCL ER 37.5 MG PO CP24: 37.5000 mg | ORAL_CAPSULE | Freq: Every day | ORAL | 1 refills | Status: DC

## 2017-12-06 NOTE — Progress Notes (Signed)
[facility]  [phone] [phone]  Clinic Follow up Note   Patient Care Team: Rennis Golden as PCP - General (Physician Assistant) Janyth Pupa, DO as Consulting Physician (Obstetrics and Gynecology) Alphonsa Overall, MD as Consulting Physician (General Surgery) Magrinat, Virgie Dad, MD as Consulting Physician (Oncology) Kyung Rudd, MD as Consulting Physician (Radiation Oncology)   Date of Service:  12/06/2017   CHIEF COMPLAINTS:  Malignant neoplasm of upper-outer quadrant of left breast in female, estrogen receptor positive    SUMMARY OF ONCOLOGIC HISTORY:   Malignant neoplasm of upper-outer quadrant of left breast in female, estrogen receptor positive ([place])   05/09/2017 Mammogram    Diagnostic Mammogram 05/09/17 IMPRESSION: 1. 2.9 x 2.8 x 1.8 cm mass with imaging features highly suspicious for malignancy in the 12:30 o'clock position of the left breast 2 cm from the nipple. 2. Left axillary lymph node measuring up to 4.2 mm in maximum thicknes with mild, lobulated and slightly irregular cortical thickening. This is suspicious for a metastatic node.       05/12/2017 Initial Diagnosis    Palpable left breast mass with nipple inversion, mammogram revealed mass at 12:30 position 2.9 cm with suspicious lymph node in axilla. 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  The Hereditary Gene Panel offered by [facility] includes sequencing and/or deletion duplication testing of the following 46 genes: APC, ATM, AXIN2, BARD1, BMPR1A, BRCA1, BRCA2, BRIP1, CDH1, CDKN2A (p14ARF), CDKN2A (p16INK4a), CHEK2, CTNNA1, DICER1, EPCAM (Deletion/duplication testing only), GREM1 (promoter region  deletion/duplication testing only), KIT, MEN1, MLH1, MSH2, MSH3, MSH6, MUTYH, NBN, NF1, NHTL1, PALB2, PDGFRA, PMS2, POLD1, POLE, PTEN, RAD50, RAD51C, RAD51D, SDHB, SDHC, SDHD, SMAD4, SMARCA4. STK11, TP53, TSC1, TSC2, and VHL.  The following genes were evaluated for sequence changes only: SDHA and HOXB13 c.251G>A variant only.  The report date is June 05, 2017.       08/03/2017 Surgery    Lt Mastectomy: IDC grade 2; 3.2 cm, margins Neg, ER 100%, PR 95%, Ki 67 15% Her 2 Neg Ratio 1.68; 0/2 LN Neg, T2N0 Stage 1A      12/06/2017 -  Anti-estrogen oral therapy    Tamoxifen 20 mg once daily to start 12/06/17 for 5 years.   -She was previously on Anastroole for 3 weeks in 05/2017. Stopped due to poor toleration from joint pain.        CURRENT THERAPY Tamoxifen 31m daily starting 12/06/17   INTERVAL HISTORY:  Monique DILGERis here for a follow up post left mastectomy. She is transferring her care from Monique [name] me. This is her first visit with me. She presents to the clinic today reporting she started with anastrozole preoperatively, but stopped soon after due to poor toleration of side effects. She felt she could not move, her joints were aching significantly. She was told it was to start shrinking her tumor but surgery pathology showed no response to it. She wonders if Tamoxifen is going to help her. She prefers quality of life over quantity of life. She does not like medication because of side effects. She is currently on Protonix only due to her Acid reflux   She has a  history of depression. She was previously on Prozac and now on XANAX. She notes her depression is not doing well lately.   She currently lives with her husband, she has no children. Her parents are still alive and live close to her. She is on disability due to her meniere's disease diagnosed 20 years ago. She has ongoing ringing in her ears, she takes XANAX to help with this. She was not closely followed by it. She notes she  has recently quit most of her usual activities.     REVIEW OF SYSTEMS:   Constitutional: Denies fevers, chills or abnormal weight loss Eyes: Denies blurriness of vision Ears, nose, mouth, throat, and face: Denies mucositis or sore throat (+) meniere's disease, ongoing ringing in ears Respiratory: Denies cough, dyspnea or wheezes Cardiovascular: Denies palpitation, chest discomfort or lower extremity swelling Gastrointestinal:  Denies nausea, heartburn or change in bowel habits (+) acid reflux  Skin: Denies abnormal skin rashes Lymphatics: Denies new lymphadenopathy or easy bruising Neurological:Denies numbness, tingling or new weaknesses Behavioral/Psych: Mood is stable, no new changes (+) depression All other systems were reviewed with the patient and are negative.  MEDICAL HISTORY:  Past Medical History:  Diagnosis Date  . Abnormal EKG   . Barrett's esophagus   . Complication of anesthesia   . Family history of breast cancer   . Family history of prostate cancer   . Family history of thyroid cancer   . Fatigue   . Generalized headaches   . IBS (irritable bowel syndrome)   . Meniere disease   . PONV (postoperative nausea and vomiting)   . Psoriasis   . Tinnitus    left ear    SURGICAL HISTORY: Past Surgical History:  Procedure Laterality Date  . DILATION AND CURETTAGE OF UTERUS    . MASTECTOMY W/ SENTINEL NODE BIOPSY Left 08/03/2017   Procedure: LEFT MASTECTOMY WITH LEFT AXILLARY SENTINEL LYMPH NODE BIOPSY;  Surgeon: Alphonsa Overall, MD;  Location: [facility];  Service: General;  Laterality: Left;  . skin cancer removal    . TONSILLECTOMY      I have reviewed the social history and family history with the patient and they are unchanged from previous note.  ALLERGIES:  is allergic to bupropion; bupropion hcl; codeine; doxycycline; and varenicline tartrate.  MEDICATIONS:  Current Outpatient Medications  Medication Sig Dispense Refill  . ALPRAZolam  (XANAX) 0.5 MG tablet Take 1 tablet (0.5 mg total) by mouth at bedtime as needed. For anxiety and sleep 30 tablet 2  . calcium carbonate 1250 MG capsule Take 1,250 mg by mouth 2 (two) times daily with a meal.    . OVER THE COUNTER MEDICATION Take 1 tablet by mouth daily as needed. For dizziness. Medication name unknown per patient.    . pantoprazole (PROTONIX) 40 MG tablet Take 1 tablet (40 mg total) by mouth daily. 30 tablet 1  . tamoxifen (NOLVADEX) 20 MG tablet Take 1 tablet (20 mg total) by mouth daily. (Patient not taking: Reported on 11/20/2017) 90 tablet 3  . venlafaxine XR (EFFEXOR-XR) 37.5 MG 24 hr capsule Take 1 capsule (37.5 mg total) by mouth daily with breakfast. 30 capsule 1   No current facility-administered medications for this visit.     PHYSICAL EXAMINATION: ECOG PERFORMANCE STATUS: 0 - Asymptomatic  Vitals:   12/06/17 1528  BP: 123/74  Pulse: 69  Resp: 20  Temp: 98.3 F (36.8 C)  SpO2: 98%   Filed Weights   12/06/17 1528  Weight: 110  lb 14.4 oz (50.3 kg)    GENERAL:alert, no distress and comfortable SKIN: skin color, texture, turgor are normal, no rashes or significant lesions EYES: normal, Conjunctiva are pink and non-injected, sclera clear OROPHARYNX:no exudate, no erythema and lips, buccal mucosa, and tongue normal  NECK: supple, thyroid normal size, non-tender, without nodularity LYMPH:  no palpable lymphadenopathy in the cervical, axillary or inguinal LUNGS: clear to auscultation and percussion with normal breathing effort HEART: regular rate & rhythm and no murmurs and no lower extremity edema ABDOMEN:abdomen soft, non-tender and normal bowel sounds Musculoskeletal:no cyanosis of digits and no clubbing  NEURO: alert & oriented x 3 with fluent speech, no focal motor/sensory deficits BREAST: (+) s/p left mastectomy: surgical incision healed very well, no palpable mass. Right breast exam normal.    LABORATORY DATA:  I have reviewed the data as  listed CBC Latest Ref Rng & Units 11/20/2017 05/24/2017  WBC 3.8 - 10.8 Thousand/uL 7.9 7.5  Hemoglobin 11.7 - 15.5 g/dL 14.1 14.1  Hematocrit 35.0 - 45.0 % 41.8 41.6  Platelets 140 - 400 Thousand/uL 367 272     CMP Latest Ref Rng & Units 11/20/2017 05/24/2017  Glucose 65 - 99 mg/dL 79 98  BUN 7 - 25 mg/dL 6(L) 6.0(L)  Creatinine 0.50 - 0.99 mg/dL 0.59 0.8  Sodium 135 - 146 mmol/L 136 138  Potassium 3.5 - 5.3 mmol/L 4.5 4.1  Chloride 98 - 110 mmol/L 100 -  CO2 20 - 32 mmol/L 24 27  Calcium 8.6 - 10.4 mg/dL 9.3 9.8  Total Protein 6.1 - 8.1 g/dL 6.5 7.1  Total Bilirubin 0.2 - 1.2 mg/dL 0.4 0.48  Alkaline Phos 40 - 150 U/L - 67  AST 10 - 35 U/L 18 22  ALT 6 - 29 U/L 17 19   PATHOLOGY  Diagnosis 08/03/17  1. Breast, simple mastectomy, Left - INVASIVE DUCTAL CARCINOMA, GRADE II/III, SPANNING 3.2 CM. - DUCTAL CARCINOMA IN SITU, INTERMEDIATE GRADE. - PERINEURAL INVASION IS IDENTIFIED. - THE SURGICAL RESECTION MARGINS ARE NEGATIVE FOR CARCINOMA. - SEE ONCOLOGY TABLE BELOW. 2. Lymph node, sentinel, biopsy, Left Axillary - THERE IS NO EVIDENCE OF CARCINOMA IN 1 OF 1 LYMPH NODE (0/1). 3. Lymph node, sentinel, biopsy, Left Axillary - THERE IS NO EVIDENCE OF CARCINOMA IN 1 OF 1 LYMPH NODE (0/1). Microscopic Comment 1. BREAST, INVASIVE TUMOR Procedure: Simple mastectomy and axillary lymph node resection Laterality: Left Tumor Size: 3.2 cm Histologic Type: Ductal Grade: II Tubular Differentiation: 3 Nuclear Pleomorphism: 2 Mitotic Count: 2 Ductal Carcinoma in Situ (DCIS): Present, intermediate grade Extent of Tumor: Confined to breast parenchyma. Margins: Greater than 0.2 cm to all margins. Regional Lymph Nodes: Number of Lymph Nodes Examined: 2 Number of Sentinel Lymph Nodes Examined: 2 Lymph Nodes with Macrometastases: 0 Lymph Nodes with Micrometastases: 0 Lymph Nodes with Isolated Tumor Cells: 0 Breast Prognostic Profile: Case [id number] Estrogen Receptor: 100%, strong 1 of  3 FINAL for KERRIA, SAPIEN ([id number]) Microscopic Comment(continued) Progesterone Receptor: 95%, strong Her2: No amplification was detected. The ratio was 1.68 Ki-67: 15% Best tumor block for sendout testing: 1B Pathologic Stage Classification (pTNM, AJCC 8th Edition): Primary Tumor (pT): pT2 Regional Lymph Nodes (pN): pN0 Distant Metastases (pM): pMX (JBK:kh 08-04-17)   Diagnosis 05/12/17  1. Lymph node, needle/core biopsy, left axilla BENIGN LYMPH NODE TISSUE 2. Breast, left, needle core biopsy, 12:30 o'clock INVASIVE DUCTAL CARCINOMA, GRADE 2 Microscopic Comment 2. The Breast prognostic profile has been ordered. Results: IMMUNOHISTOCHEMICAL AND MORPHOMETRIC ANALYSIS PERFORMED MANUALLY Estrogen Receptor: 100%, POSITIVE,  STRONG STAINING INTENSITY Progesterone Receptor: 95%, POSITIVE, STRONG STAINING INTENSITY Proliferation Marker Ki67: 15% 2. FLUORESCENCE IN-SITU HYBRIDIZATION Results: HER2 - NEGATIVE RATIO OF HER2/CEP17 SIGNALS 1.68 AVERAGE HER2 COPY NUMBER PER CELL 3.35   RADIOGRAPHIC STUDIES: I have personally reviewed the radiological images as listed and agreed with the findings in the report. No results found.   Diagnostic Mammogram 05/09/17 IMPRESSION: 1. 2.9 x 2.8 x 1.8 cm mass with imaging features highly suspicious for malignancy in the 12:30 o'clock position of the left breast 2 cm from the nipple. 2. Left axillary lymph node measuring up to 4.2 mm in maximum thicknes with mild, lobulated and slightly irregular cortical thickening. This is suspicious for a metastatic node.   ASSESSMENT & PLAN:  LAVELLA MYREN is a 61 y.o. caucasian female with a history of depression, IBS, Meniere's disease, Psoriasis and Tinnitus   1. Malignant neoplasm of upper-outer quadrant of left breast, Stage IA, (T2N0Mx), ER/PR Positive, HER2 Negative, Grade 2, Oncotype RS 5 -Her biopsy from 05/12/17 showed invasive ductal carcinoma of the left breast   -She started Anastrozole  preoperatively in 05/2017, but due to significant side effects and stopped on 06/07/17 -She underwent Left mastectomy by Dr. Lucia Gaskins on 08/03/17 -I reviewed her surgical path result. Tumor was completely resected with negative margins and node negative.  -the Oncotype Dx result was reviewed with her in details. She has low risk based on the recurrence score of 5, which predicts 10 year distant recurrence after 5 years of tamoxifen 5%. No need adjuvant chemo.  -Given she had a mastectomy and pathology was node negative she does not need adjuvant radiation.  -She had strong ER and PR expression in her tumor and although she is post menopausal I recommend adjuvant endocrine therapy.  She previously could not tolerate aromatase inhibitor anastrozole, Dr. Payton Mccallum has called in tamoxifen, she initially was very reluctant to try.  I discussed the benefit and potential side effects in details, after lengthy discussion she agrees to try.  ---The potential side effects, which includes but not limited to, hot flash, skin and vaginal dryness, slightly increased risk of cardiovascular disease and cataract, small risk of thrombosis and endometrial cancer, were discussed with her in great details. Preventive strategies for thrombosis, such as being physically active, using compression stocks, avoid cigarette smoking, etc., were reviewed with her. I also recommend her to follow-up with her gynecologist once a year, and watch for vaginal spotting or bleeding, as a clinically sign of endometrial cancer, etc. She voiced good understanding, and agrees to proceed. Will start this week. -I encouraged her to have a heathy diet, exercise regularly, work on her immune system and to be positive. She can use certain supplements, non-estrogen based. -She is clinically doing well. Her physical exam and her 04/2017 mammogram were unremarkable. There is no clinical concern for recurrence. -survivorship clinic in 2 months -F/u in 4 months     2. Genetics -Given her family history of breast cancer she completed genetic testing -Results are negative except for A Variant of Uncertain Significance, c.2396C>T (p.Ala799Val), was identified in ATM.   3. Depression and anxiety  -She had a history of depression, was on Prozac for a long time ago.  Her depression has gotten worse after her cancer diagnosis and treatment, currently on XANAX as needed. -I encouraged her to try to be positive, and try antidepressant medication.  I will start her on low dose Effexor, prescribed on 12/06/17.  Potential benefits and side effects discussed with  her, she agrees to proceed. -I suggest she meet with our social worker for counseling. She is agreeable.  I referred her.    PLAN:  -Prescribe Effexor today -Start Tamoxifen today  -Survivorship clinic in 2 months -Lab and f/u in 4 months  -SW consult for depression     No problem-specific Assessment & Plan notes found for this encounter.   Orders Placed This Encounter  Procedures  . CBC with Differential    Standing Status:   Standing    Number of Occurrences:   30    Standing Expiration Date:   12/05/2022  . Comprehensive metabolic panel    Standing Status:   Standing    Number of Occurrences:   30    Standing Expiration Date:   12/05/2022   All questions were answered. The patient knows to call the clinic with any problems, questions or concerns. No barriers to learning was detected. I spent 25 minutes counseling the patient face to face. The total time spent in the appointment was 30 minutes and more than 50% was on counseling and review of test results     Truitt Merle, MD 12/06/2017 11:09 PM   This document serves as a record of services personally performed by Truitt Merle, MD. It was created on her behalf by Joslyn Devon, a trained medical scribe. The creation of this record is based on the scribe's personal observations and the provider's statements to them.    I have reviewed  the above documentation for accuracy and completeness, and I agree with the above.

## 2017-12-07 ENCOUNTER — Telehealth: Payer: Self-pay | Admitting: Hematology

## 2017-12-07 ENCOUNTER — Encounter: Payer: Self-pay | Admitting: Hematology

## 2017-12-07 NOTE — Telephone Encounter (Signed)
Spoke to patient regarding upcoming February and April appointments.

## 2018-02-07 ENCOUNTER — Telehealth: Payer: Self-pay

## 2018-02-07 NOTE — Telephone Encounter (Signed)
Spoke with patient to remind of SCP visit on 02/13/18 at 10 am.  Patient said she cannot come to appt and does not want to reschedule.

## 2018-02-13 ENCOUNTER — Encounter: Payer: BLUE CROSS/BLUE SHIELD | Admitting: Adult Health

## 2018-03-15 DIAGNOSIS — C50912 Malignant neoplasm of unspecified site of left female breast: Secondary | ICD-10-CM | POA: Diagnosis not present

## 2018-04-11 ENCOUNTER — Inpatient Hospital Stay: Payer: BLUE CROSS/BLUE SHIELD | Attending: Adult Health

## 2018-04-11 ENCOUNTER — Inpatient Hospital Stay: Payer: BLUE CROSS/BLUE SHIELD | Admitting: Hematology

## 2018-04-19 ENCOUNTER — Other Ambulatory Visit: Payer: Self-pay | Admitting: Physician Assistant

## 2018-04-19 DIAGNOSIS — G4700 Insomnia, unspecified: Secondary | ICD-10-CM

## 2018-04-20 NOTE — Telephone Encounter (Signed)
Last OV 11/20/2017 Last refill 11/20/2017 Ok to refill?

## 2018-05-10 DIAGNOSIS — R300 Dysuria: Secondary | ICD-10-CM | POA: Diagnosis not present

## 2018-05-10 DIAGNOSIS — Z01411 Encounter for gynecological examination (general) (routine) with abnormal findings: Secondary | ICD-10-CM | POA: Diagnosis not present

## 2018-06-20 ENCOUNTER — Encounter: Payer: Self-pay | Admitting: Physician Assistant

## 2018-06-20 ENCOUNTER — Ambulatory Visit (INDEPENDENT_AMBULATORY_CARE_PROVIDER_SITE_OTHER): Payer: BLUE CROSS/BLUE SHIELD | Admitting: Physician Assistant

## 2018-06-20 ENCOUNTER — Other Ambulatory Visit: Payer: Self-pay

## 2018-06-20 VITALS — BP 124/68 | HR 76 | Temp 98.5°F | Resp 14 | Ht 59.5 in | Wt 110.2 lb

## 2018-06-20 DIAGNOSIS — M545 Low back pain, unspecified: Secondary | ICD-10-CM

## 2018-06-20 MED ORDER — CYCLOBENZAPRINE HCL 10 MG PO TABS: 10.0000 mg | ORAL_TABLET | Freq: Three times a day (TID) | ORAL | 0 refills | Status: DC | PRN

## 2018-06-20 NOTE — Progress Notes (Signed)
Patient ID: Monique [name] MRN: [id number], DOB: 03-10-1956, 62 y.o. Date of Encounter: 06/20/2018, 2:34 PM    Chief Complaint:  Chief Complaint  Patient presents with  . Back Pain    lower back     HPI: 62 y.o. year old female presents with above.   Says that she woke up Monday morning with severe pain and spasm in her bilateral low back.   Says "I could not sit, I could not bend, I could not open the drawer ".   Says that she tried Tylenol and Motrin with no relief.   Did apply some ice and that did help.   Could not get out of the bed Monday.   Finally on Tuesday which was yesterday at about 2 PM was able to move some.   Today she woke up feeling just fine but she had already made this appointment and her husband told her to come in to have this evaluated.   Says that she has never had this type of pain before.  She points to bilateral low back at about L5 level as area that she was feeling this pain.  Asked if she had done any activity on Sunday that may have caused this/ contributed to this.   Discussed that this definitely sounds like muscle spasm/musculoskeletal pain.  She states that on Sunday they rode motorcycles but says that they do that routinely and she has never had this problem before.  She also had planted some flowers but that have been a couple weeks ago.  Can not think of any other activity that would have contributed.  Has had no trauma or injury. Has had no pain, no tingling, no weakness down either leg or foot.     Home Meds:   Outpatient Medications Prior to Visit  Medication Sig Dispense Refill  . ALPRAZolam (XANAX) 0.5 MG tablet TAKE ONE TABLET BY MOUTH AT BEDTIME AS NEEDED FOR ANXIETY AND SLEEP 30 tablet 3  . calcium carbonate 1250 MG capsule Take 1,250 mg by mouth 2 (two) times daily with a meal.    . OVER THE COUNTER MEDICATION Take 1 tablet by mouth daily as needed. For dizziness. Medication name unknown per patient.    . pantoprazole  (PROTONIX) 40 MG tablet Take 1 tablet (40 mg total) by mouth daily. 30 tablet 1  . tamoxifen (NOLVADEX) 20 MG tablet Take 1 tablet (20 mg total) by mouth daily. 90 tablet 3  . venlafaxine XR (EFFEXOR-XR) 37.5 MG 24 hr capsule Take 1 capsule (37.5 mg total) by mouth daily with breakfast. 30 capsule 1   No facility-administered medications prior to visit.     Allergies:  Allergies  Allergen Reactions  . Bupropion Other (See Comments)    Hallucinate  . Bupropion Hcl Other (See Comments)    hallucinations  . Codeine Nausea And Vomiting  . Doxycycline Nausea And Vomiting  . Varenicline Tartrate Palpitations      Review of Systems: See HPI for pertinent ROS. All other ROS negative.    Physical Exam: Blood pressure 124/68, pulse 76, temperature 98.5 F (36.9 C), temperature source Oral, resp. rate 14, height 4' 11.5" (1.511 m), weight 50 kg (110 lb 3.2 oz), SpO2 97 %., Body mass index is 21.89 kg/m. General:  WNWD WF. Appears in no acute distress. Neck: Supple. No thyromegaly. No lymphadenopathy. Lungs: Clear bilaterally to auscultation without wheezes, rales, or rhonchi. Breathing is unlabored. Heart: Regular rhythm. No murmurs, rubs, or gallops. Msk:  Strength and tone normal for age. Points to bilateral low back at about L5 level as area where she is feeling discomfort. Extremities/Skin: Warm and dry.  Neuro: Alert and oriented X 3. Moves all extremities spontaneously. Gait is normal. CNII-XII grossly in tact. Psych:  Responds to questions appropriately with a normal affect.     ASSESSMENT AND PLAN:  62 y.o. year old female with   1. Acute bilateral low back pain without sciatica Current episode has pretty much resolved.  Did discuss that if she does have recurrent symptoms in the future what to do.  Explained to her that this is caused by a muscle spasm musculoskeletal pain.  Discussed stretching to prevent recurrence. If has recurrent episode can use this muscle relaxer.   Cautioned that it may cause drowsiness.  In addition to this can also use anti-inflammatories such as Motrin Advil ibuprofen Aleve.  With food. You can apply heat to the area as this should help to relax the muscles.  Also can have her husband massage the area to help loosen muscle and decrease spasm.  Follow up as needed. - cyclobenzaprine (FLEXERIL) 10 MG tablet; Take 1 tablet (10 mg total) by mouth 3 (three) times daily as needed for muscle spasms.  Dispense: 30 tablet; Refill: 0   Signed, [address] [place], [place], [facility] 06/20/2018 2:34 PM

## 2018-11-22 ENCOUNTER — Encounter: Payer: BLUE CROSS/BLUE SHIELD | Admitting: Physician Assistant

## 2018-11-23 ENCOUNTER — Ambulatory Visit (INDEPENDENT_AMBULATORY_CARE_PROVIDER_SITE_OTHER): Payer: BLUE CROSS/BLUE SHIELD | Admitting: Family Medicine

## 2018-11-23 ENCOUNTER — Encounter: Payer: Self-pay | Admitting: Family Medicine

## 2018-11-23 VITALS — BP 136/80 | HR 74 | Temp 98.1°F | Resp 14 | Ht 59.5 in | Wt 113.0 lb

## 2018-11-23 DIAGNOSIS — Z1159 Encounter for screening for other viral diseases: Secondary | ICD-10-CM | POA: Diagnosis not present

## 2018-11-23 DIAGNOSIS — C50912 Malignant neoplasm of unspecified site of left female breast: Secondary | ICD-10-CM

## 2018-11-23 DIAGNOSIS — Z0000 Encounter for general adult medical examination without abnormal findings: Secondary | ICD-10-CM | POA: Diagnosis not present

## 2018-11-23 MED ORDER — DIAZEPAM 10 MG PO TABS: 10.0000 mg | ORAL_TABLET | Freq: Two times a day (BID) | ORAL | 1 refills | Status: DC | PRN

## 2018-11-23 NOTE — Progress Notes (Signed)
Subjective:    Patient ID: Monique [name], female    DOB: 1956/05/21, 62 y.o.   MRN: [id number]  HPI Patient is here today for complete physical exam.  She has a history of breast cancer in her left breast.  She is already had her mammogram this year and has another one scheduled for an the beginning of next year.  She follows up regularly with oncology.  She sees a gynecologist who performs her Pap smear and pelvic exam.  She states that she had a colonoscopy performed in 2012 and is up-to-date.  Her immunizations are listed below: Immunization History  Administered Date(s) Administered  . Influenza-Unspecified 10/15/2010  . Tdap 10/14/2010   She is due for a flu shot along with Shingrix.  She politely refuses these. Past Medical History:  Diagnosis Date  . Abnormal EKG   . Barrett's esophagus   . Complication of anesthesia   . Family history of breast cancer   . Family history of prostate cancer   . Family history of thyroid cancer   . Fatigue   . Generalized headaches   . IBS (irritable bowel syndrome)   . Meniere disease   . PONV (postoperative nausea and vomiting)   . Psoriasis   . Tinnitus    left ear   Past Surgical History:  Procedure Laterality Date  . DILATION AND CURETTAGE OF UTERUS    . MASTECTOMY W/ SENTINEL NODE BIOPSY Left 08/03/2017   Procedure: LEFT MASTECTOMY WITH LEFT AXILLARY SENTINEL LYMPH NODE BIOPSY;  Surgeon: Alphonsa Overall, MD;  Location: [place];  Service: General;  Laterality: Left;  . skin cancer removal    . TONSILLECTOMY     Current Outpatient Medications on File Prior to Visit  Medication Sig Dispense Refill  . ALPRAZolam (XANAX) 0.5 MG tablet TAKE ONE TABLET BY MOUTH AT BEDTIME AS NEEDED FOR ANXIETY AND SLEEP 30 tablet 3  . calcium carbonate 1250 MG capsule Take 1,250 mg by mouth 2 (two) times daily with a meal.    . cyclobenzaprine (FLEXERIL) 10 MG tablet Take 1 tablet (10 mg total) by mouth 3 (three) times daily as needed  for muscle spasms. 30 tablet 0  . OVER THE COUNTER MEDICATION Take 1 tablet by mouth daily as needed. For dizziness. Medication name unknown per patient.    . tamoxifen (NOLVADEX) 20 MG tablet Take 1 tablet (20 mg total) by mouth daily. 90 tablet 3  . venlafaxine XR (EFFEXOR-XR) 37.5 MG 24 hr capsule Take 1 capsule (37.5 mg total) by mouth daily with breakfast. 30 capsule 1  . pantoprazole (PROTONIX) 40 MG tablet Take 1 tablet (40 mg total) by mouth daily. 30 tablet 1   No current facility-administered medications on file prior to visit.    Allergies  Allergen Reactions  . Bupropion Other (See Comments)    Hallucinate  . Bupropion Hcl Other (See Comments)    hallucinations  . Codeine Nausea And Vomiting  . Varenicline Tartrate Palpitations   Social History   Socioeconomic History  . Marital status: Married    Spouse name: Not on file  . Number of children: Not on file  . Years of education: Not on file  . Highest education level: Not on file  Occupational History  . Not on file  Social Needs  . Financial resource strain: Not on file  . Food insecurity:    Worry: Not on file    Inability: Not on file  . Transportation needs:  Medical: Not on file    Non-medical: Not on file  Tobacco Use  . Smoking status: Passive Smoke Exposure - Never Smoker  . Smokeless tobacco: Former Network engineer and Sexual Activity  . Alcohol use: Yes    Comment: occas  . Drug use: Not on file  . Sexual activity: Not on file  Lifestyle  . Physical activity:    Days per week: Not on file    Minutes per session: Not on file  . Stress: Not on file  Relationships  . Social connections:    Talks on phone: Not on file    Gets together: Not on file    Attends religious service: Not on file    Active member of club or organization: Not on file    Attends meetings of clubs or organizations: Not on file    Relationship status: Not on file  . Intimate partner violence:    Fear of current or ex  partner: Not on file    Emotionally abused: Not on file    Physically abused: Not on file    Forced sexual activity: Not on file  Other Topics Concern  . Not on file  Social History Narrative  . Not on file      Review of Systems  All other systems reviewed and are negative.      Objective:   Physical Exam  Constitutional: She is oriented to person, place, and time. She appears well-developed and well-nourished. No distress.  HENT:  Head: Normocephalic and atraumatic.  Right Ear: External ear normal.  Left Ear: External ear normal.  Nose: Nose normal.  Mouth/Throat: Oropharynx is clear and moist. No oropharyngeal exudate.  Eyes: Pupils are equal, round, and reactive to light. Conjunctivae and EOM are normal. Right eye exhibits no discharge. Left eye exhibits no discharge. No scleral icterus.  Neck: Normal range of motion. Neck supple. No JVD present. No tracheal deviation present. No thyromegaly present.  Cardiovascular: Regular rhythm, normal heart sounds and intact distal pulses. Exam reveals no gallop and no friction rub.  No murmur heard. Pulmonary/Chest: Effort normal and breath sounds normal. No stridor. No respiratory distress. She has no wheezes. She has no rales. She exhibits no tenderness.  Abdominal: Soft. Bowel sounds are normal. She exhibits no distension and no mass. There is no tenderness. There is no rebound and no guarding. No hernia.  Musculoskeletal: She exhibits no edema or deformity.  Lymphadenopathy:    She has no cervical adenopathy.  Neurological: She is alert and oriented to person, place, and time. She displays normal reflexes. No cranial nerve deficit. She exhibits normal muscle tone. Coordination normal.  Skin: No rash noted. She is not diaphoretic. No erythema. No pallor.  Psychiatric: She has a normal mood and affect. Her behavior is normal. Judgment and thought content normal.  Vitals reviewed.         Assessment & Plan:  General medical  exam - Plan: CBC with Differential/Platelet, COMPLETE METABOLIC PANEL WITH GFR, Lipid panel  Encounter for hepatitis C screening test for low risk patient - Plan: Hepatitis C Antibody  Cancer of left breast, stage 2 ([place])  Visible exam today is normal.  Mammogram was performed through her gynecologist is up-to-date.  Colonoscopy is not due again until 2022.  Pap smear was performed through her gynecologist and is up-to-date.  She is not yet due for a bone density.  I will screen the patient for hepatitis C.  She refuses Shingrix.  She  declines a flu shot.  I will check CBC, CMP, fasting lipid panel.  There are some other issues that she discussed.  #1 she reports frequent urinary tract infections despite having negative cultures.  I raised the concern that this could be a sign of interstitial cystitis and I recommended the next time that she has a "urinary tract infection" we ought to see the patient to perform a urine culture to see if it is in fact the urinary tract infection.  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  At present she uses alprazolam.  A 30-day supply usually lasts for 3 months.  She states that she she uses it primarily at night to help her sleep because the tinnitus keeps her awake.  She is requesting to switch from alprazolam to Valium 5 mg at night.  I will give the patient 10 mg asked her to break them in half and give her 30 tablets.  Hopefully this will last 6 to 7 months.

## 2018-11-24 LAB — CBC WITH DIFFERENTIAL/PLATELET
BASOS ABS: 39 {cells}/uL (ref 0–200)
Basophils Relative: 0.6 %
EOS PCT: 2.9 %
Eosinophils Absolute: 189 cells/uL (ref 15–500)
HCT: 43.2 % (ref 35.0–45.0)
HEMOGLOBIN: 14.1 g/dL (ref 11.7–15.5)
LYMPHS ABS: 2425 {cells}/uL (ref 850–3900)
MCH: 28.4 pg (ref 27.0–33.0)
MCHC: 32.6 g/dL (ref 32.0–36.0)
MCV: 87.1 fL (ref 80.0–100.0)
MONOS PCT: 4.7 %
MPV: 10.9 fL (ref 7.5–12.5)
NEUTROS ABS: 3543 {cells}/uL (ref 1500–7800)
Neutrophils Relative %: 54.5 %
Platelets: 329 10*3/uL (ref 140–400)
RBC: 4.96 10*6/uL (ref 3.80–5.10)
RDW: 12 % (ref 11.0–15.0)
Total Lymphocyte: 37.3 %
WBC mixed population: 306 cells/uL (ref 200–950)
WBC: 6.5 10*3/uL (ref 3.8–10.8)

## 2018-11-24 LAB — COMPLETE METABOLIC PANEL WITH GFR
AG Ratio: 1.8 (calc) (ref 1.0–2.5)
ALT: 11 U/L (ref 6–29)
AST: 19 U/L (ref 10–35)
Albumin: 4.6 g/dL (ref 3.6–5.1)
Alkaline phosphatase (APISO): 72 U/L (ref 33–130)
BUN: 7 mg/dL (ref 7–25)
CO2: 22 mmol/L (ref 20–32)
Calcium: 9.5 mg/dL (ref 8.6–10.4)
Chloride: 100 mmol/L (ref 98–110)
Creat: 0.75 mg/dL (ref 0.50–0.99)
GFR, Est African American: 100 mL/min/{1.73_m2} (ref >=60)
GFR, Est Non African American: 86 mL/min/{1.73_m2} (ref >=60)
GLOBULIN: 2.5 g/dL (ref 1.9–3.7)
Glucose, Bld: 86 mg/dL (ref 65–99)
Potassium: 4.2 mmol/L (ref 3.5–5.3)
SODIUM: 137 mmol/L (ref 135–146)
Total Bilirubin: 0.5 mg/dL (ref 0.2–1.2)
Total Protein: 7.1 g/dL (ref 6.1–8.1)

## 2018-11-24 LAB — LIPID PANEL
Cholesterol: 186 mg/dL (ref <200)
HDL: 61 mg/dL (ref >50)
LDL Cholesterol (Calc): 105 mg/dL (calc) — ABNORMAL HIGH
Non-HDL Cholesterol (Calc): 125 mg/dL (calc) (ref <130)
Total CHOL/HDL Ratio: 3 (calc) (ref <5.0)
Triglycerides: 106 mg/dL (ref <150)

## 2018-11-24 LAB — HEPATITIS C ANTIBODY
Hepatitis C Ab: NONREACTIVE
SIGNAL TO CUT-OFF: 0.01 (ref <1.00)

## 2018-11-29 ENCOUNTER — Encounter: Payer: Self-pay | Admitting: *Deleted

## 2019-02-13 ENCOUNTER — Other Ambulatory Visit: Payer: Self-pay | Admitting: Family Medicine

## 2019-02-13 NOTE — Telephone Encounter (Signed)
Requesting refill    Valium  LOV: 11/23/18  LRF:  11/23/18

## 2019-04-02 ENCOUNTER — Other Ambulatory Visit: Payer: Self-pay | Admitting: Family Medicine

## 2019-04-02 NOTE — Telephone Encounter (Signed)
Ok to refill??  Last office visit 11/23/2018.  Last refill 02/14/2019.

## 2019-05-14 DIAGNOSIS — Z01419 Encounter for gynecological examination (general) (routine) without abnormal findings: Secondary | ICD-10-CM | POA: Diagnosis not present

## 2019-05-14 DIAGNOSIS — Z853 Personal history of malignant neoplasm of breast: Secondary | ICD-10-CM | POA: Diagnosis not present

## 2019-06-27 ENCOUNTER — Other Ambulatory Visit: Payer: Self-pay | Admitting: Family Medicine

## 2019-06-27 NOTE — Telephone Encounter (Signed)
Requesting refill  Valium  LOV: 11/23/2018  LRF:  04/02/19

## 2019-08-28 ENCOUNTER — Other Ambulatory Visit: Payer: Self-pay | Admitting: Family Medicine

## 2019-08-29 NOTE — Telephone Encounter (Signed)
Ok to refill??  Last office visit 11/23/2018.  Last refill 06/27/2019.

## 2019-11-04 ENCOUNTER — Other Ambulatory Visit: Payer: Self-pay | Admitting: Family Medicine

## 2019-11-04 NOTE — Telephone Encounter (Signed)
Ok to refill??  Last office visit 11/23/2018.  Last refill 08/30/2019.

## 2019-11-29 ENCOUNTER — Encounter: Payer: Self-pay | Admitting: Family Medicine

## 2019-11-29 ENCOUNTER — Ambulatory Visit (INDEPENDENT_AMBULATORY_CARE_PROVIDER_SITE_OTHER): Payer: BC Managed Care – PPO | Admitting: Family Medicine

## 2019-11-29 ENCOUNTER — Other Ambulatory Visit: Payer: Self-pay | Admitting: Family Medicine

## 2019-11-29 VITALS — BP 110/64 | HR 64 | Temp 97.0°F | Resp 16 | Ht 59.5 in | Wt 107.0 lb

## 2019-11-29 DIAGNOSIS — Z0000 Encounter for general adult medical examination without abnormal findings: Secondary | ICD-10-CM

## 2019-11-29 DIAGNOSIS — Z0001 Encounter for general adult medical examination with abnormal findings: Secondary | ICD-10-CM | POA: Diagnosis not present

## 2019-11-29 DIAGNOSIS — Z853 Personal history of malignant neoplasm of breast: Secondary | ICD-10-CM

## 2019-11-29 DIAGNOSIS — Z1231 Encounter for screening mammogram for malignant neoplasm of breast: Secondary | ICD-10-CM | POA: Diagnosis not present

## 2019-11-29 DIAGNOSIS — L820 Inflamed seborrheic keratosis: Secondary | ICD-10-CM

## 2019-11-29 DIAGNOSIS — Z13220 Encounter for screening for lipoid disorders: Secondary | ICD-10-CM | POA: Diagnosis not present

## 2019-11-29 DIAGNOSIS — Z780 Asymptomatic menopausal state: Secondary | ICD-10-CM

## 2019-11-29 NOTE — Progress Notes (Signed)
Subjective:    Patient ID: Monique [name], female    DOB: May 10, 1956, 63 y.o.   MRN: [id number]  HPI  Patient is here today for complete physical exam.  She has a history of breast cancer in her left breast.  Patient is due for her mammogram.  Last mammogram was in May 2018.  She is also due for a bone density.  She has a history of osteoporosis and her grandmother and aunt.  Patient denies any personal history of osteoporosis however she does have a very thin build and therefore would be at higher risk.  Otherwise she is doing well with no concerns.  She does have a seborrheic keratosis on her left flank.  It 6 mm in diameter.  It is constantly being aggravated when she wears undergarments or gets dressed.  She is requesting that I remove this.  It does not appear cancerous.  She is due for a flu shot along with Shingrix.  She politely refuses these. Past Medical History:  Diagnosis Date  . Abnormal EKG   . Barrett's esophagus   . Complication of anesthesia   . Family history of breast cancer   . Family history of prostate cancer   . Family history of thyroid cancer   . Fatigue   . Generalized headaches   . IBS (irritable bowel syndrome)   . Meniere disease   . PONV (postoperative nausea and vomiting)   . Psoriasis   . Tinnitus    left ear   Past Surgical History:  Procedure Laterality Date  . DILATION AND CURETTAGE OF UTERUS    . MASTECTOMY W/ SENTINEL NODE BIOPSY Left 08/03/2017   Procedure: LEFT MASTECTOMY WITH LEFT AXILLARY SENTINEL LYMPH NODE BIOPSY;  Surgeon: Alphonsa Overall, MD;  Location: [place];  Service: General;  Laterality: Left;  . skin cancer removal    . TONSILLECTOMY     Current Outpatient Medications on File Prior to Visit  Medication Sig Dispense Refill  . diazepam (VALIUM) 10 MG tablet TAKE ONE TABLET BY MOUTH EVERY 12 HOURS AS NEEDED FOR ANXIETY 30 tablet 0  . OVER THE COUNTER MEDICATION Take 1 tablet by mouth daily as needed. For dizziness.  Medication name unknown per patient.     No current facility-administered medications on file prior to visit.   Allergies  Allergen Reactions  . Bupropion Other (See Comments)    Hallucinate  . Bupropion Hcl Other (See Comments)    hallucinations  . Codeine Nausea And Vomiting  . Varenicline Tartrate Palpitations   Social History   Socioeconomic History  . Marital status: Married    Spouse name: Not on file  . Number of children: Not on file  . Years of education: Not on file  . Highest education level: Not on file  Occupational History  . Not on file  Tobacco Use  . Smoking status: Passive Smoke Exposure - Never Smoker  . Smokeless tobacco: Former Network engineer and Sexual Activity  . Alcohol use: Yes    Comment: occas  . Drug use: Not on file  . Sexual activity: Not on file  Other Topics Concern  . Not on file  Social History Narrative  . Not on file   Social Determinants of Health   Financial Resource Strain:   . Difficulty of Paying Living Expenses: Not on file  Food Insecurity:   . Worried About Charity fundraiser in the Last Year: Not on file  . Ran  Out of Food in the Last Year: Not on file  Transportation Needs:   . Lack of Transportation (Medical): Not on file  . Lack of Transportation (Non-Medical): Not on file  Physical Activity:   . Days of Exercise per Week: Not on file  . Minutes of Exercise per Session: Not on file  Stress:   . Feeling of Stress : Not on file  Social Connections:   . Frequency of Communication with Friends and Family: Not on file  . Frequency of Social Gatherings with Friends and Family: Not on file  . Attends Religious Services: Not on file  . Active Member of Clubs or Organizations: Not on file  . Attends Archivist Meetings: Not on file  . Marital Status: Not on file  Intimate Partner Violence:   . Fear of Current or Ex-Partner: Not on file  . Emotionally Abused: Not on file  . Physically Abused: Not on file   . Sexually Abused: Not on file      Review of Systems  All other systems reviewed and are negative.      Objective:   Physical Exam Vitals reviewed.  Constitutional:      General: She is not in acute distress.    Appearance: She is well-developed. She is not diaphoretic.  HENT:     Head: Normocephalic and atraumatic.     Right Ear: External ear normal.     Left Ear: External ear normal.     Nose: Nose normal.     Mouth/Throat:     Pharynx: No oropharyngeal exudate.  Eyes:     General: No scleral icterus.       Right eye: No discharge.        Left eye: No discharge.     Conjunctiva/sclera: Conjunctivae normal.     Pupils: Pupils are equal, round, and reactive to light.  Neck:     Thyroid: No thyromegaly.     Vascular: No JVD.     Trachea: No tracheal deviation.  Cardiovascular:     Rate and Rhythm: Regular rhythm.     Heart sounds: Normal heart sounds. No murmur. No friction rub. No gallop.   Pulmonary:     Effort: Pulmonary effort is normal. No respiratory distress.     Breath sounds: Normal breath sounds. No stridor. No wheezing or rales.  Chest:     Chest wall: No tenderness.  Abdominal:     General: Bowel sounds are normal. There is no distension.     Palpations: Abdomen is soft. There is no mass.     Tenderness: There is no abdominal tenderness. There is no guarding or rebound.     Hernia: No hernia is present.  Musculoskeletal:        General: No deformity.     Cervical back: Normal range of motion and neck supple.  Lymphadenopathy:     Cervical: No cervical adenopathy.  Skin:    Coloration: Skin is not pale.     Findings: No erythema or rash.  Neurological:     Mental Status: She is alert and oriented to person, place, and time.     Cranial Nerves: No cranial nerve deficit.     Motor: No abnormal muscle tone.     Coordination: Coordination normal.     Deep Tendon Reflexes: Reflexes normal.  Psychiatric:        Behavior: Behavior normal.         Thought Content: Thought content normal.  Judgment: Judgment normal.           Assessment & Plan:  General medical exam - Plan: CBC with Differential, COMPLETE METABOLIC PANEL WITH GFR, Lipid Panel  History of left breast cancer  Screening cholesterol level - Plan: CBC with Differential, COMPLETE METABOLIC PANEL WITH GFR, Lipid Panel  Encounter for screening mammogram for malignant neoplasm of breast - Plan: MM Digital Screening  Postmenopausal estrogen deficiency - Plan: DG Bone Density  Physical exam today is completely normal.  Blood pressure is excellent.  Colonoscopy is due in 2022.  Pap smear was performed by her gynecologist.  I will schedule the patient for a mammogram along with a bone density test.  I recommended 1200 mg a day of calcium and 1000 units a day of vitamin D.  I recommended a flu shot as well as the shingles vaccine but the patient politely declined.  I will check a CBC, CMP, fasting lipid panel.  Patient also requested I remove an irritated seborrheic keratosis from her left flank.  The area was anesthetized with 0.1% lidocaine with epinephrine.  It was removed using a razor blade down to the subcutaneous fascia.  Hemostasis was achieved with Drysol and a Band-Aid.  The lesion was not sent to pathology as it did not appear cancerous

## 2019-11-30 LAB — CBC WITH DIFFERENTIAL/PLATELET
Absolute Monocytes: 312 cells/uL (ref 200–950)
Basophils Absolute: 59 cells/uL (ref 0–200)
Basophils Relative: 0.9 %
Eosinophils Absolute: 189 cells/uL (ref 15–500)
Eosinophils Relative: 2.9 %
HCT: 41.4 % (ref 35.0–45.0)
Hemoglobin: 13.8 g/dL (ref 11.7–15.5)
Lymphs Abs: 2366 cells/uL (ref 850–3900)
MCH: 29.1 pg (ref 27.0–33.0)
MCHC: 33.3 g/dL (ref 32.0–36.0)
MCV: 87.2 fL (ref 80.0–100.0)
MPV: 10.1 fL (ref 7.5–12.5)
Monocytes Relative: 4.8 %
Neutro Abs: 3575 cells/uL (ref 1500–7800)
Neutrophils Relative %: 55 %
Platelets: 356 10*3/uL (ref 140–400)
RBC: 4.75 10*6/uL (ref 3.80–5.10)
RDW: 12.4 % (ref 11.0–15.0)
Total Lymphocyte: 36.4 %
WBC: 6.5 10*3/uL (ref 3.8–10.8)

## 2019-11-30 LAB — COMPLETE METABOLIC PANEL WITH GFR
AG Ratio: 1.9 (calc) (ref 1.0–2.5)
ALT: 10 U/L (ref 6–29)
AST: 16 U/L (ref 10–35)
Albumin: 4.3 g/dL (ref 3.6–5.1)
Alkaline phosphatase (APISO): 65 U/L (ref 37–153)
BUN: 9 mg/dL (ref 7–25)
CO2: 26 mmol/L (ref 20–32)
Calcium: 9.3 mg/dL (ref 8.6–10.4)
Chloride: 98 mmol/L (ref 98–110)
Creat: 0.75 mg/dL (ref 0.50–0.99)
GFR, Est African American: 99 mL/min/{1.73_m2} (ref >=60)
GFR, Est Non African American: 85 mL/min/{1.73_m2} (ref >=60)
Globulin: 2.3 g/dL (calc) (ref 1.9–3.7)
Glucose, Bld: 98 mg/dL (ref 65–99)
Potassium: 4.4 mmol/L (ref 3.5–5.3)
Sodium: 135 mmol/L (ref 135–146)
Total Bilirubin: 0.4 mg/dL (ref 0.2–1.2)
Total Protein: 6.6 g/dL (ref 6.1–8.1)

## 2019-11-30 LAB — LIPID PANEL
Cholesterol: 194 mg/dL (ref <200)
HDL: 61 mg/dL (ref >=50)
LDL Cholesterol (Calc): 111 mg/dL (calc) — ABNORMAL HIGH
Non-HDL Cholesterol (Calc): 133 mg/dL (calc) — ABNORMAL HIGH (ref <130)
Total CHOL/HDL Ratio: 3.2 (calc) (ref <5.0)
Triglycerides: 113 mg/dL (ref <150)

## 2019-12-06 ENCOUNTER — Other Ambulatory Visit: Payer: Self-pay | Admitting: Family Medicine

## 2019-12-09 NOTE — Telephone Encounter (Signed)
Ok to refill??  Last office visit 11/29/2019.  Last refill 11/04/2019.  Ok to add refills to prescription?

## 2020-02-27 ENCOUNTER — Ambulatory Visit
Admission: RE | Admit: 2020-02-27 | Discharge: 2020-02-27 | Disposition: A | Discharge status: Home / Self Care | Payer: BLUE CROSS/BLUE SHIELD | Source: Ambulatory Visit | Attending: Family Medicine | Admitting: Family Medicine

## 2020-02-27 ENCOUNTER — Other Ambulatory Visit: Payer: Self-pay | Admitting: Family Medicine

## 2020-02-27 ENCOUNTER — Other Ambulatory Visit: Payer: Self-pay

## 2020-02-27 DIAGNOSIS — Z780 Asymptomatic menopausal state: Secondary | ICD-10-CM

## 2020-02-27 DIAGNOSIS — M8589 Other specified disorders of bone density and structure, multiple sites: Secondary | ICD-10-CM | POA: Diagnosis not present

## 2020-02-27 DIAGNOSIS — Z853 Personal history of malignant neoplasm of breast: Secondary | ICD-10-CM

## 2020-02-27 DIAGNOSIS — Z1231 Encounter for screening mammogram for malignant neoplasm of breast: Secondary | ICD-10-CM | POA: Diagnosis not present

## 2020-02-28 ENCOUNTER — Encounter: Payer: Self-pay | Admitting: *Deleted

## 2020-02-28 ENCOUNTER — Encounter: Payer: Self-pay | Admitting: Family Medicine

## 2020-02-28 DIAGNOSIS — M8580 Other specified disorders of bone density and structure, unspecified site: Secondary | ICD-10-CM | POA: Insufficient documentation

## 2020-05-28 DIAGNOSIS — Z01419 Encounter for gynecological examination (general) (routine) without abnormal findings: Secondary | ICD-10-CM | POA: Diagnosis not present

## 2020-06-29 ENCOUNTER — Other Ambulatory Visit: Payer: Self-pay | Admitting: Family Medicine

## 2020-06-30 NOTE — Telephone Encounter (Signed)
Ok to refill??  Last office visit 11/29/2019.  Last refill 12/09/2019, #3 refills.

## 2020-08-28 ENCOUNTER — Ambulatory Visit (INDEPENDENT_AMBULATORY_CARE_PROVIDER_SITE_OTHER): Payer: BC Managed Care – PPO | Admitting: Family Medicine

## 2020-08-28 ENCOUNTER — Ambulatory Visit
Admission: RE | Admit: 2020-08-28 | Discharge: 2020-08-28 | Disposition: A | Discharge status: Home / Self Care | Payer: BC Managed Care – PPO | Source: Ambulatory Visit | Attending: Family Medicine | Admitting: Family Medicine

## 2020-08-28 ENCOUNTER — Other Ambulatory Visit: Payer: Self-pay

## 2020-08-28 VITALS — BP 128/70 | HR 69 | Temp 98.1°F | Ht 59.0 in | Wt 108.0 lb

## 2020-08-28 DIAGNOSIS — R079 Chest pain, unspecified: Secondary | ICD-10-CM | POA: Diagnosis not present

## 2020-08-28 DIAGNOSIS — R918 Other nonspecific abnormal finding of lung field: Secondary | ICD-10-CM | POA: Diagnosis not present

## 2020-08-28 MED ORDER — BUPROPION HCL ER (XL) 150 MG PO TB24: 150.0000 mg | ORAL_TABLET | Freq: Every day | ORAL | 3 refills | Status: DC

## 2020-08-28 MED ORDER — HYDROCORTISONE ACETATE 25 MG RE SUPP: 25.0000 mg | Freq: Two times a day (BID) | RECTAL | 0 refills | Status: DC

## 2020-08-28 NOTE — Progress Notes (Signed)
Subjective:    Patient ID: Monique [name], female    DOB: Jan 25, 1956, 64 y.o.   MRN: [id number]  HPI Patient started back smoking.  She states that 3 weeks ago she developed a cough.  It is primarily at night.  She denies any hemoptysis.  She denies any purulent sputum.  However she has developed some right-sided chest pain.  It comes and goes.  There is no relationship to activity.  In fact the more she gets up and moves around the better she feels.  She denies any pleurisy.  She denies any relationship of the pain to food.  It is to the right of the sternum over the right breast.  She denies any shortness of breath or dyspnea on exertion. Past Medical History:  Diagnosis Date  . Abnormal EKG   . Barrett's esophagus   . Breast cancer ([place]) 2018   Left breast  . Complication of anesthesia   . Family history of breast cancer   . Family history of prostate cancer   . Family history of thyroid cancer   . Fatigue   . Generalized headaches   . IBS (irritable bowel syndrome)   . Meniere disease   . Osteopenia   . PONV (postoperative nausea and vomiting)   . Psoriasis   . Tinnitus    left ear   Past Surgical History:  Procedure Laterality Date  . DILATION AND CURETTAGE OF UTERUS    . MASTECTOMY Left 2018  . MASTECTOMY W/ SENTINEL NODE BIOPSY Left 08/03/2017   Procedure: LEFT MASTECTOMY WITH LEFT AXILLARY SENTINEL LYMPH NODE BIOPSY;  Surgeon: Alphonsa Overall, MD;  Location: [place];  Service: General;  Laterality: Left;  . skin cancer removal    . TONSILLECTOMY     Current Outpatient Medications on File Prior to Visit  Medication Sig Dispense Refill  . diazepam (VALIUM) 10 MG tablet TAKE ONE TABLET BY MOUTH EVERY 12 HOURS AS NEEDED FOR ANXIETY 30 tablet 2  . OVER THE COUNTER MEDICATION Take 1 tablet by mouth daily as needed. For dizziness. Medication name unknown per patient.     No current facility-administered medications on file prior to visit.   Allergies   Allergen Reactions  . Bupropion Other (See Comments)    Hallucinate  . Bupropion Hcl Other (See Comments)    hallucinations  . Codeine Nausea And Vomiting  . Varenicline Tartrate Palpitations   Social History   Socioeconomic History  . Marital status: Married    Spouse name: Not on file  . Number of children: Not on file  . Years of education: Not on file  . Highest education level: Not on file  Occupational History  . Not on file  Tobacco Use  . Smoking status: Passive Smoke Exposure - Never Smoker  . Smokeless tobacco: Former Network engineer and Sexual Activity  . Alcohol use: Yes    Comment: occas  . Drug use: Not on file  . Sexual activity: Not on file  Other Topics Concern  . Not on file  Social History Narrative  . Not on file   Social Determinants of Health   Financial Resource Strain:   . Difficulty of Paying Living Expenses: Not on file  Food Insecurity:   . Worried About Charity fundraiser in the Last Year: Not on file  . Ran Out of Food in the Last Year: Not on file  Transportation Needs:   . Lack of Transportation (Medical): Not on  file  . Lack of Transportation (Non-Medical): Not on file  Physical Activity:   . Days of Exercise per Week: Not on file  . Minutes of Exercise per Session: Not on file  Stress:   . Feeling of Stress : Not on file  Social Connections:   . Frequency of Communication with Friends and Family: Not on file  . Frequency of Social Gatherings with Friends and Family: Not on file  . Attends Religious Services: Not on file  . Active Member of Clubs or Organizations: Not on file  . Attends Archivist Meetings: Not on file  . Marital Status: Not on file  Intimate Partner Violence:   . Fear of Current or Ex-Partner: Not on file  . Emotionally Abused: Not on file  . Physically Abused: Not on file  . Sexually Abused: Not on file      Review of Systems  All other systems reviewed and are negative.      Objective:    Physical Exam Vitals reviewed.  Constitutional:      General: She is not in acute distress.    Appearance: She is well-developed. She is not diaphoretic.  HENT:     Head: Normocephalic and atraumatic.     Right Ear: External ear normal.     Left Ear: External ear normal.     Nose: Nose normal.     Mouth/Throat:     Pharynx: No oropharyngeal exudate.  Eyes:     General: No scleral icterus.       Right eye: No discharge.        Left eye: No discharge.     Conjunctiva/sclera: Conjunctivae normal.     Pupils: Pupils are equal, round, and reactive to light.  Neck:     Thyroid: No thyromegaly.     Vascular: No JVD.     Trachea: No tracheal deviation.  Cardiovascular:     Rate and Rhythm: Regular rhythm.     Heart sounds: Normal heart sounds. No murmur heard.  No friction rub. No gallop.   Pulmonary:     Effort: Pulmonary effort is normal. No respiratory distress.     Breath sounds: Normal breath sounds. No stridor. No wheezing or rales.  Chest:     Chest wall: No tenderness.  Abdominal:     General: Bowel sounds are normal. There is no distension.     Palpations: Abdomen is soft. There is no mass.     Tenderness: There is no abdominal tenderness. There is no guarding or rebound.     Hernia: No hernia is present.  Musculoskeletal:        General: No deformity.     Cervical back: Normal range of motion and neck supple.  Lymphadenopathy:     Cervical: No cervical adenopathy.  Skin:    Coloration: Skin is not pale.     Findings: No erythema or rash.  Neurological:     Mental Status: She is alert and oriented to person, place, and time.     Cranial Nerves: No cranial nerve deficit.     Motor: No abnormal muscle tone.     Coordination: Coordination normal.     Deep Tendon Reflexes: Reflexes normal.  Psychiatric:        Behavior: Behavior normal.        Thought Content: Thought content normal.        Judgment: Judgment normal.           Assessment & Plan:  Chest  pain at rest - Plan: DG Chest 2 View, CBC with Differential/Platelet, COMPLETE METABOLIC PANEL WITH GFR, D-dimer, quantitative (not at [facility])  I believe this is likely muscular.  I will obtain an x-ray of the chest to evaluate further however her exam today is completely normal.  I am unable to reproduce the pain with palpation.  The lungs are clear to auscultation.  The pain does not sound ischemic in nature.  I will also check a D-dimer to rule out pulmonary embolism.  Recommended smoking cessation.  We discussed strategies for smoking cessation including Wellbutrin and nicotine replacement.  She would like to try Wellbutrin again.  This is listed as an allergy but she states that this is incorrect.  Her previous allergy was to Chantix not Wellbutrin.  Therefore she would like to try Wellbutrin 150 mg once a day.  She will stop the medication immediately if she has any side effects.  She will use nicotine replacement as well 14 mg patches daily and slowly wean herself away from the cigarettes.  She also asked for refill on her Anusol suppositories that she uses occasionally for hemorrhoids

## 2020-08-29 LAB — COMPLETE METABOLIC PANEL WITH GFR
AG Ratio: 1.7 (calc) (ref 1.0–2.5)
ALT: 9 U/L (ref 6–29)
AST: 16 U/L (ref 10–35)
Albumin: 4.5 g/dL (ref 3.6–5.1)
Alkaline phosphatase (APISO): 71 U/L (ref 37–153)
BUN: 8 mg/dL (ref 7–25)
CO2: 28 mmol/L (ref 20–32)
Calcium: 9.7 mg/dL (ref 8.6–10.4)
Chloride: 97 mmol/L — ABNORMAL LOW (ref 98–110)
Creat: 0.83 mg/dL (ref 0.50–0.99)
GFR, Est African American: 87 mL/min/{1.73_m2} (ref >=60)
GFR, Est Non African American: 75 mL/min/{1.73_m2} (ref >=60)
Globulin: 2.6 g/dL (calc) (ref 1.9–3.7)
Glucose, Bld: 91 mg/dL (ref 65–99)
Potassium: 4.6 mmol/L (ref 3.5–5.3)
Sodium: 136 mmol/L (ref 135–146)
Total Bilirubin: 0.3 mg/dL (ref 0.2–1.2)
Total Protein: 7.1 g/dL (ref 6.1–8.1)

## 2020-08-29 LAB — CBC WITH DIFFERENTIAL/PLATELET
Absolute Monocytes: 280 cells/uL (ref 200–950)
Basophils Absolute: 56 cells/uL (ref 0–200)
Basophils Relative: 0.7 %
Eosinophils Absolute: 216 cells/uL (ref 15–500)
Eosinophils Relative: 2.7 %
HCT: 42 % (ref 35.0–45.0)
Hemoglobin: 13.9 g/dL (ref 11.7–15.5)
Lymphs Abs: 2704 cells/uL (ref 850–3900)
MCH: 28.9 pg (ref 27.0–33.0)
MCHC: 33.1 g/dL (ref 32.0–36.0)
MCV: 87.3 fL (ref 80.0–100.0)
MPV: 9.8 fL (ref 7.5–12.5)
Monocytes Relative: 3.5 %
Neutro Abs: 4744 cells/uL (ref 1500–7800)
Neutrophils Relative %: 59.3 %
Platelets: 411 10*3/uL — ABNORMAL HIGH (ref 140–400)
RBC: 4.81 10*6/uL (ref 3.80–5.10)
RDW: 11.8 % (ref 11.0–15.0)
Total Lymphocyte: 33.8 %
WBC: 8 10*3/uL (ref 3.8–10.8)

## 2020-08-29 LAB — D-DIMER, QUANTITATIVE: D-Dimer, Quant: 0.37 mcg/mL FEU (ref <0.50)

## 2020-08-31 ENCOUNTER — Encounter: Payer: Self-pay | Admitting: Family Medicine

## 2020-08-31 ENCOUNTER — Other Ambulatory Visit: Payer: Self-pay | Admitting: Family Medicine

## 2020-08-31 DIAGNOSIS — J189 Pneumonia, unspecified organism: Secondary | ICD-10-CM

## 2020-08-31 DIAGNOSIS — R079 Chest pain, unspecified: Secondary | ICD-10-CM

## 2020-08-31 MED ORDER — LEVOFLOXACIN 500 MG PO TABS: 500.0000 mg | ORAL_TABLET | Freq: Every day | ORAL | 0 refills | Status: DC

## 2020-09-29 ENCOUNTER — Ambulatory Visit
Admission: RE | Admit: 2020-09-29 | Discharge: 2020-09-29 | Disposition: A | Discharge status: Home / Self Care | Payer: BC Managed Care – PPO | Source: Ambulatory Visit | Attending: Family Medicine | Admitting: Family Medicine

## 2020-09-29 DIAGNOSIS — R918 Other nonspecific abnormal finding of lung field: Secondary | ICD-10-CM | POA: Diagnosis not present

## 2020-09-29 DIAGNOSIS — J189 Pneumonia, unspecified organism: Secondary | ICD-10-CM

## 2020-09-29 DIAGNOSIS — R079 Chest pain, unspecified: Secondary | ICD-10-CM

## 2020-09-29 DIAGNOSIS — Z8701 Personal history of pneumonia (recurrent): Secondary | ICD-10-CM | POA: Diagnosis not present

## 2020-10-01 ENCOUNTER — Encounter: Payer: Self-pay | Admitting: Family Medicine

## 2020-10-05 ENCOUNTER — Other Ambulatory Visit: Payer: Self-pay | Admitting: *Deleted

## 2020-10-05 DIAGNOSIS — J189 Pneumonia, unspecified organism: Secondary | ICD-10-CM

## 2020-11-19 ENCOUNTER — Ambulatory Visit
Admission: RE | Admit: 2020-11-19 | Discharge: 2020-11-19 | Disposition: A | Discharge status: Home / Self Care | Payer: BC Managed Care – PPO | Source: Ambulatory Visit | Attending: Family Medicine | Admitting: Family Medicine

## 2020-11-19 ENCOUNTER — Encounter: Payer: Self-pay | Admitting: Family Medicine

## 2020-11-19 DIAGNOSIS — J189 Pneumonia, unspecified organism: Secondary | ICD-10-CM

## 2020-11-19 DIAGNOSIS — Z8701 Personal history of pneumonia (recurrent): Secondary | ICD-10-CM | POA: Diagnosis not present

## 2020-11-20 ENCOUNTER — Other Ambulatory Visit: Payer: Self-pay

## 2020-11-20 MED ORDER — DIAZEPAM 10 MG PO TABS: ORAL_TABLET | ORAL | 2 refills | Status: DC

## 2020-11-20 NOTE — Telephone Encounter (Signed)
Ok to refill??  Last office visit 08/28/2020.  Last refill 07/10/2020, #2 refills.

## 2020-12-01 ENCOUNTER — Other Ambulatory Visit: Payer: Self-pay

## 2020-12-01 ENCOUNTER — Ambulatory Visit (INDEPENDENT_AMBULATORY_CARE_PROVIDER_SITE_OTHER): Payer: BC Managed Care – PPO | Admitting: Family Medicine

## 2020-12-01 VITALS — BP 120/62 | HR 60 | Temp 98.2°F | Ht 59.0 in | Wt 116.0 lb

## 2020-12-01 DIAGNOSIS — Z0000 Encounter for general adult medical examination without abnormal findings: Secondary | ICD-10-CM

## 2020-12-01 NOTE — Progress Notes (Signed)
Subjective:    Patient ID: Monique [name], female    DOB: 12-18-1956, 64 y.o.   MRN: [id number]  HPI Patient is here today for complete physical exam.  She has a history of breast cancer in her left breast.  Last mammogram was in June.  She is not due again for mammogram until June 2022.  She states her colonoscopy was less than 10 years ago.  We did discuss Cologuard but she declines a referral at the present time.  Her Pap smear was performed earlier this year in June and therefore she is up-to-date.  She had a bone density test last year and therefore this is up-to-date.  She is due for a flu shot, Pneumovax 23, shingles vaccine, and a Covid shot.  We discussed these at great length today and I strongly encouraged especially the Covid shot and the pneumonia vaccine.  Patient will think about this.  She is taking calcium and vitamin D to help protect her bones.  We discussed lung cancer screening with a CT scan but she declines this at the present time Past Medical History:  Diagnosis Date  . Abnormal EKG   . Anxiety    Phreesia 11/29/2020  . Arthritis    Phreesia 11/29/2020  . Barrett's esophagus   . Breast cancer ([place]) 2018   Left breast  . Cancer ([place])    Phreesia 11/29/2020  . Complication of anesthesia   . Family history of breast cancer   . Family history of prostate cancer   . Family history of thyroid cancer   . Fatigue   . Generalized headaches   . IBS (irritable bowel syndrome)   . Meniere disease   . Osteopenia   . PONV (postoperative nausea and vomiting)   . Psoriasis   . Tinnitus    left ear   Past Surgical History:  Procedure Laterality Date  . BREAST SURGERY N/A    Phreesia 11/29/2020  . DILATION AND CURETTAGE OF UTERUS    . MASTECTOMY Left 2018  . MASTECTOMY W/ SENTINEL NODE BIOPSY Left 08/03/2017   Procedure: LEFT MASTECTOMY WITH LEFT AXILLARY SENTINEL LYMPH NODE BIOPSY;  Surgeon: Alphonsa Overall, MD;  Location: [facility];  Service: General;   Laterality: Left;  . skin cancer removal    . TONSILLECTOMY     Current Outpatient Medications on File Prior to Visit  Medication Sig Dispense Refill  . calcium carbonate (OS-CAL - DOSED IN MG OF ELEMENTAL CALCIUM) 1250 (500 Ca) MG tablet Take 1,200 tablets by mouth.    . cholecalciferol (VITAMIN D3) 25 MCG (1000 UNIT) tablet Take 1,000 Units by mouth daily.    . diazepam (VALIUM) 10 MG tablet TAKE ONE TABLET BY MOUTH EVERY 12 HOURS AS NEEDED FOR ANXIETY 30 tablet 2  . hydrocortisone (ANUSOL-HC) 25 MG suppository Place 1 suppository (25 mg total) rectally 2 (two) times daily. 12 suppository 0  . OVER THE COUNTER MEDICATION Take 1 tablet by mouth daily as needed. For dizziness. Medication name unknown per patient.    Marland Kitchen buPROPion (WELLBUTRIN XL) 150 MG 24 hr tablet Take 1 tablet (150 mg total) by mouth daily. 30 tablet 3  . levofloxacin (LEVAQUIN) 500 MG tablet Take 1 tablet (500 mg total) by mouth daily. 7 tablet 0   No current facility-administered medications on file prior to visit.   Allergies  Allergen Reactions  . Bupropion Other (See Comments)    Hallucinate  . Bupropion Hcl Other (See Comments)  hallucinations  . Codeine Nausea And Vomiting  . Varenicline Tartrate Palpitations   Social History   Socioeconomic History  . Marital status: Married    Spouse name: Not on file  . Number of children: Not on file  . Years of education: Not on file  . Highest education level: Not on file  Occupational History  . Not on file  Tobacco Use  . Smoking status: Passive Smoke Exposure - Never Smoker  . Smokeless tobacco: Former Network engineer and Sexual Activity  . Alcohol use: Yes    Comment: occas  . Drug use: Not on file  . Sexual activity: Not on file  Other Topics Concern  . Not on file  Social History Narrative  . Not on file   Social Determinants of Health   Financial Resource Strain: Not on file  Food Insecurity: Not on file  Transportation Needs: Not on file   Physical Activity: Not on file  Stress: Not on file  Social Connections: Not on file  Intimate Partner Violence: Not on file      Review of Systems  All other systems reviewed and are negative.      Objective:   Physical Exam Vitals reviewed.  Constitutional:      General: She is not in acute distress.    Appearance: She is well-developed. She is not diaphoretic.  HENT:     Head: Normocephalic and atraumatic.     Right Ear: External ear normal.     Left Ear: External ear normal.     Nose: Nose normal.     Mouth/Throat:     Pharynx: No oropharyngeal exudate.  Eyes:     General: No scleral icterus.       Right eye: No discharge.        Left eye: No discharge.     Conjunctiva/sclera: Conjunctivae normal.     Pupils: Pupils are equal, round, and reactive to light.  Neck:     Thyroid: No thyromegaly.     Vascular: No JVD.     Trachea: No tracheal deviation.  Cardiovascular:     Rate and Rhythm: Regular rhythm.     Heart sounds: Normal heart sounds. No murmur heard. No friction rub. No gallop.   Pulmonary:     Effort: Pulmonary effort is normal. No respiratory distress.     Breath sounds: Normal breath sounds. No stridor. No wheezing or rales.  Chest:     Chest wall: No tenderness.  Abdominal:     General: Bowel sounds are normal. There is no distension.     Palpations: Abdomen is soft. There is no mass.     Tenderness: There is no abdominal tenderness. There is no guarding or rebound.     Hernia: No hernia is present.  Musculoskeletal:        General: No deformity.     Cervical back: Normal range of motion and neck supple.  Lymphadenopathy:     Cervical: No cervical adenopathy.  Skin:    Coloration: Skin is not pale.     Findings: No erythema or rash.  Neurological:     Mental Status: She is alert and oriented to person, place, and time.     Cranial Nerves: No cranial nerve deficit.     Motor: No abnormal muscle tone.     Coordination: Coordination normal.      Deep Tendon Reflexes: Reflexes normal.  Psychiatric:        Behavior: Behavior normal.  Thought Content: Thought content normal.        Judgment: Judgment normal.           Assessment & Plan:  General medical exam  Recommended a CT scan to screen for lung cancer but she declined.  Mammogram is up-to-date.  Patient declined Cologuard.  Not yet due for a colonoscopy.  Pap smear is up-to-date.  Bone density is up-to-date.  Strongly recommended a flu shot, Covid vaccine, Pneumovax 23.  Also encouraged the patient to consider a shingles vaccine.  She politely declined all of these.  Patient has CBC, CMP earlier this year in September which were normal.  She is due for cholesterol check but she politely declined this as well.

## 2021-03-18 ENCOUNTER — Encounter: Payer: Self-pay | Admitting: Family Medicine

## 2021-03-18 ENCOUNTER — Other Ambulatory Visit: Payer: Self-pay

## 2021-03-18 ENCOUNTER — Ambulatory Visit (INDEPENDENT_AMBULATORY_CARE_PROVIDER_SITE_OTHER): Payer: BC Managed Care – PPO | Admitting: Family Medicine

## 2021-03-18 VITALS — BP 122/78 | HR 82 | Temp 97.9°F | Resp 14 | Ht 59.0 in | Wt 125.0 lb

## 2021-03-18 DIAGNOSIS — R1011 Right upper quadrant pain: Secondary | ICD-10-CM

## 2021-03-18 DIAGNOSIS — R300 Dysuria: Secondary | ICD-10-CM

## 2021-03-18 LAB — URINALYSIS, ROUTINE W REFLEX MICROSCOPIC
Bacteria, UA: NONE SEEN /HPF
Bilirubin Urine: NEGATIVE
Glucose, UA: NEGATIVE
Hyaline Cast: NONE SEEN /LPF
Ketones, ur: NEGATIVE
Nitrite: NEGATIVE
Protein, ur: NEGATIVE
Specific Gravity, Urine: 1.003 (ref 1.001–1.03)
pH: 5 (ref 5.0–8.0)

## 2021-03-18 LAB — MICROSCOPIC MESSAGE

## 2021-03-18 MED ORDER — HYDROCODONE-ACETAMINOPHEN 5-325 MG PO TABS: 1.0000 | ORAL_TABLET | Freq: Four times a day (QID) | ORAL | 0 refills | Status: DC | PRN

## 2021-03-18 MED ORDER — SULFAMETHOXAZOLE-TRIMETHOPRIM 800-160 MG PO TABS: 1.0000 | ORAL_TABLET | Freq: Two times a day (BID) | ORAL | 0 refills | Status: DC

## 2021-03-18 NOTE — Progress Notes (Signed)
Subjective:    Patient ID: MCKALA PANTALEON, female    DOB: 12-Aug-1956, 65 y.o.   MRN: [id number]  HPI Over the last week, the patient has had 3 episodes of severe epigastric pain.  It occurs usually within 1 to 2 hours of the eating.  It starts primarily in the right upper quadrant but it also involves the left upper quadrant.  It is intense.  It last for approximately an hour to 2 hours and then gradually subsides.  The first time occurred an hour or so after eating barbecue.  The second time occurred prior to an hour after eating milk.  At the present time she is pain-free however this has been occurring over and over and she is worried is going to happen again.  She is also reporting increased pelvic pressure and frequency and dysuria.  Urinalysis shows trace leukocyte esterase and trace blood suggesting a bladder infection Past Medical History:  Diagnosis Date  . Abnormal EKG   . Anxiety    Phreesia 11/29/2020  . Arthritis    Phreesia 11/29/2020  . Barrett's esophagus   . Breast cancer ([facility]) 2018   Left breast  . Cancer ([place])    Phreesia 11/29/2020  . Complication of anesthesia   . Family history of breast cancer   . Family history of prostate cancer   . Family history of thyroid cancer   . Fatigue   . Generalized headaches   . IBS (irritable bowel syndrome)   . Meniere disease   . Osteopenia   . PONV (postoperative nausea and vomiting)   . Psoriasis   . Tinnitus    left ear   Past Surgical History:  Procedure Laterality Date  . BREAST SURGERY N/A    Phreesia 11/29/2020  . DILATION AND CURETTAGE OF UTERUS    . MASTECTOMY Left 2018  . MASTECTOMY W/ SENTINEL NODE BIOPSY Left 08/03/2017   Procedure: LEFT MASTECTOMY WITH LEFT AXILLARY SENTINEL LYMPH NODE BIOPSY;  Surgeon: Alphonsa Overall, MD;  Location: [facility];  Service: General;  Laterality: Left;  . skin cancer removal    . TONSILLECTOMY     Current Outpatient Medications on File Prior to Visit   Medication Sig Dispense Refill  . buPROPion (WELLBUTRIN XL) 150 MG 24 hr tablet Take 1 tablet (150 mg total) by mouth daily. 30 tablet 3  . calcium carbonate (OS-CAL - DOSED IN MG OF ELEMENTAL CALCIUM) 1250 (500 Ca) MG tablet Take 1,200 tablets by mouth.    . cholecalciferol (VITAMIN D3) 25 MCG (1000 UNIT) tablet Take 1,000 Units by mouth daily.    . diazepam (VALIUM) 10 MG tablet TAKE ONE TABLET BY MOUTH EVERY 12 HOURS AS NEEDED FOR ANXIETY 30 tablet 2  . hydrocortisone (ANUSOL-HC) 25 MG suppository Place 1 suppository (25 mg total) rectally 2 (two) times daily. 12 suppository 0  . levofloxacin (LEVAQUIN) 500 MG tablet Take 1 tablet (500 mg total) by mouth daily. 7 tablet 0  . OVER THE COUNTER MEDICATION Take 1 tablet by mouth daily as needed. For dizziness. Medication name unknown per patient.     No current facility-administered medications on file prior to visit.   Allergies  Allergen Reactions  . Bupropion Other (See Comments)    Hallucinate  . Bupropion Hcl Other (See Comments)    hallucinations  . Codeine Nausea And Vomiting  . Varenicline Tartrate Palpitations   Social History   Socioeconomic History  . Marital status: Married    Spouse name:  Not on file  . Number of children: Not on file  . Years of education: Not on file  . Highest education level: Not on file  Occupational History  . Not on file  Tobacco Use  . Smoking status: Passive Smoke Exposure - Never Smoker  . Smokeless tobacco: Former Network engineer and Sexual Activity  . Alcohol use: Yes    Comment: occas  . Drug use: Not on file  . Sexual activity: Not on file  Other Topics Concern  . Not on file  Social History Narrative  . Not on file   Social Determinants of Health   Financial Resource Strain: Not on file  Food Insecurity: Not on file  Transportation Needs: Not on file  Physical Activity: Not on file  Stress: Not on file  Social Connections: Not on file  Intimate Partner Violence: Not on  file      Review of Systems  All other systems reviewed and are negative.      Objective:   Physical Exam Vitals reviewed.  Constitutional:      General: She is not in acute distress.    Appearance: She is well-developed. She is not diaphoretic.  HENT:     Head: Normocephalic and atraumatic.     Right Ear: External ear normal.     Left Ear: External ear normal.     Nose: Nose normal.     Mouth/Throat:     Pharynx: No oropharyngeal exudate.  Eyes:     General: No scleral icterus.       Right eye: No discharge.        Left eye: No discharge.     Conjunctiva/sclera: Conjunctivae normal.     Pupils: Pupils are equal, round, and reactive to light.  Neck:     Thyroid: No thyromegaly.     Vascular: No JVD.     Trachea: No tracheal deviation.  Cardiovascular:     Rate and Rhythm: Regular rhythm.     Heart sounds: Normal heart sounds. No murmur heard. No friction rub. No gallop.   Pulmonary:     Effort: Pulmonary effort is normal. No respiratory distress.     Breath sounds: Normal breath sounds. No stridor. No wheezing or rales.  Chest:     Chest wall: No tenderness.  Abdominal:     General: Bowel sounds are normal. There is no distension.     Palpations: Abdomen is soft. There is no mass.     Tenderness: There is no abdominal tenderness. There is no guarding or rebound.     Hernia: No hernia is present.    Musculoskeletal:        General: No deformity.     Cervical back: Normal range of motion and neck supple.  Lymphadenopathy:     Cervical: No cervical adenopathy.  Skin:    Coloration: Skin is not pale.     Findings: No erythema or rash.  Neurological:     Mental Status: She is alert and oriented to person, place, and time.     Cranial Nerves: No cranial nerve deficit.     Motor: No abnormal muscle tone.     Coordination: Coordination normal.     Deep Tendon Reflexes: Reflexes normal.  Psychiatric:        Behavior: Behavior normal.        Thought Content:  Thought content normal.        Judgment: Judgment normal.  Assessment & Plan:  Dysuria - Plan: Urinalysis, Routine w reflex microscopic  RUQ pain - Plan: US Abdomen Limited RUQ (LIVER/GB)  Although not certain, her symptoms sound consistent with biliary colic.  I recommended a low-fat diet.  I will give her pain medication to take in case it occurs again.  If she develops fever, jaundice, or unrelenting right upper quadrant pain she is to go to the emergency room.  Or if her symptoms change she is to seek medical attention immediately.  Otherwise, if she has gallstones on her ultrasound, I will consult general surgery.  I will treat her bladder infection with Bactrim double strength tablets twice daily for 5 days.

## 2021-03-22 ENCOUNTER — Encounter: Payer: Self-pay | Admitting: Family Medicine

## 2021-03-24 ENCOUNTER — Other Ambulatory Visit: Payer: Self-pay

## 2021-03-24 ENCOUNTER — Other Ambulatory Visit: Payer: BC Managed Care – PPO

## 2021-03-24 DIAGNOSIS — R8290 Unspecified abnormal findings in urine: Secondary | ICD-10-CM | POA: Diagnosis not present

## 2021-03-24 LAB — URINALYSIS, ROUTINE W REFLEX MICROSCOPIC
Bilirubin Urine: NEGATIVE
Glucose, UA: NEGATIVE
Ketones, ur: NEGATIVE
Leukocytes,Ua: NEGATIVE
Nitrite: NEGATIVE
Protein, ur: NEGATIVE
Specific Gravity, Urine: 1.01 (ref 1.001–1.03)
pH: 5.5 (ref 5.0–8.0)

## 2021-03-24 LAB — MICROSCOPIC MESSAGE

## 2021-03-25 ENCOUNTER — Other Ambulatory Visit: Payer: Self-pay | Admitting: Family Medicine

## 2021-03-25 ENCOUNTER — Other Ambulatory Visit: Payer: Self-pay | Admitting: *Deleted

## 2021-03-25 DIAGNOSIS — Z1231 Encounter for screening mammogram for malignant neoplasm of breast: Secondary | ICD-10-CM

## 2021-03-25 MED ORDER — DIAZEPAM 10 MG PO TABS: ORAL_TABLET | ORAL | 2 refills | Status: DC

## 2021-03-25 NOTE — Telephone Encounter (Signed)
Received fax requesting refill on diazepam.  Ok to refill??  Last office visit 03/18/2021.  Last refill 11/20/2020, #2 refills.

## 2021-03-26 LAB — URINE CULTURE
MICRO NUMBER:: 11738423
SPECIMEN QUALITY:: ADEQUATE

## 2021-04-07 ENCOUNTER — Ambulatory Visit
Admission: RE | Admit: 2021-04-07 | Discharge: 2021-04-07 | Disposition: A | Discharge status: Home / Self Care | Payer: BC Managed Care – PPO | Source: Ambulatory Visit | Attending: Family Medicine | Admitting: Family Medicine

## 2021-04-07 DIAGNOSIS — R1011 Right upper quadrant pain: Secondary | ICD-10-CM

## 2021-04-07 DIAGNOSIS — N281 Cyst of kidney, acquired: Secondary | ICD-10-CM | POA: Diagnosis not present

## 2021-04-08 ENCOUNTER — Encounter: Payer: Self-pay | Admitting: Family Medicine

## 2021-04-08 ENCOUNTER — Other Ambulatory Visit: Payer: Self-pay | Admitting: Family Medicine

## 2021-04-08 DIAGNOSIS — K811 Chronic cholecystitis: Secondary | ICD-10-CM

## 2021-05-04 ENCOUNTER — Other Ambulatory Visit: Payer: Self-pay | Admitting: Surgery

## 2021-05-04 DIAGNOSIS — K811 Chronic cholecystitis: Secondary | ICD-10-CM | POA: Diagnosis not present

## 2021-05-27 ENCOUNTER — Other Ambulatory Visit: Payer: Self-pay

## 2021-05-27 ENCOUNTER — Ambulatory Visit
Admission: RE | Admit: 2021-05-27 | Discharge: 2021-05-27 | Disposition: A | Discharge status: Home / Self Care | Payer: BC Managed Care – PPO | Source: Ambulatory Visit | Attending: Family Medicine | Admitting: Family Medicine

## 2021-05-27 DIAGNOSIS — Z1231 Encounter for screening mammogram for malignant neoplasm of breast: Secondary | ICD-10-CM | POA: Diagnosis not present

## 2021-07-14 ENCOUNTER — Other Ambulatory Visit: Payer: Self-pay | Admitting: *Deleted

## 2021-07-14 NOTE — Telephone Encounter (Signed)
Received fax requesting refill on Diazepam.   Ok to refill??  Last office visit 04/08/2021.  Last refill 03/25/2021, #2 refills.

## 2021-07-15 MED ORDER — DIAZEPAM 10 MG PO TABS: ORAL_TABLET | ORAL | 2 refills | Status: DC

## 2021-08-04 ENCOUNTER — Encounter: Payer: Self-pay | Admitting: Family Medicine

## 2021-08-05 ENCOUNTER — Other Ambulatory Visit: Payer: Self-pay | Admitting: Family Medicine

## 2021-08-05 DIAGNOSIS — R198 Other specified symptoms and signs involving the digestive system and abdomen: Secondary | ICD-10-CM

## 2021-08-10 ENCOUNTER — Encounter: Payer: Self-pay | Admitting: Gastroenterology

## 2021-08-24 ENCOUNTER — Ambulatory Visit (INDEPENDENT_AMBULATORY_CARE_PROVIDER_SITE_OTHER): Payer: BC Managed Care – PPO | Admitting: Gastroenterology

## 2021-08-24 ENCOUNTER — Encounter: Payer: Self-pay | Admitting: Gastroenterology

## 2021-08-24 VITALS — BP 120/60 | HR 72 | Ht 60.0 in | Wt 119.0 lb

## 2021-08-24 DIAGNOSIS — Z1211 Encounter for screening for malignant neoplasm of colon: Secondary | ICD-10-CM

## 2021-08-24 DIAGNOSIS — R1011 Right upper quadrant pain: Secondary | ICD-10-CM

## 2021-08-24 DIAGNOSIS — Z1212 Encounter for screening for malignant neoplasm of rectum: Secondary | ICD-10-CM

## 2021-08-24 DIAGNOSIS — Z8711 Personal history of peptic ulcer disease: Secondary | ICD-10-CM

## 2021-08-24 DIAGNOSIS — K582 Mixed irritable bowel syndrome: Secondary | ICD-10-CM | POA: Diagnosis not present

## 2021-08-24 DIAGNOSIS — R1013 Epigastric pain: Secondary | ICD-10-CM

## 2021-08-24 MED ORDER — PANTOPRAZOLE SODIUM 40 MG PO TBEC: 40.0000 mg | DELAYED_RELEASE_TABLET | Freq: Every day | ORAL | 1 refills | Status: DC

## 2021-08-24 MED ORDER — PLENVU 140 G PO SOLR: ORAL | 0 refills | Status: DC

## 2021-08-24 NOTE — Progress Notes (Signed)
HPI : Monique [name] is a pleasant 65 year old female with a history of anxiety and breast cancer referred by Dr. Jenna Luo for further evaluation of abdominal pain.  Patient states she for started having this pain in March of this year.  She reports having episodes of severe abdominal pain, sometimes associated with nausea and vomiting and on one occasion diarrhea.  These episodes occur after eating the last for hours at a time.  Outside of these acute episodes, the patient also has problems with lesser abdominal discomfort, bloating and distention and irregular bowel habits.  She states she started having irregular bowel habits after she quit smoking several years ago.  She used to have a bowel movement once a day, with formed soft stools.  Now she goes between having 1-3 loose bowel movements with urgency to then going 1 to 2 days without a bowel movement.  She frequently has a sensation of incomplete evacuation.  She denies any blood in the stool.  She also complains of vague swallowing difficulties "like fluid leaking out of my esophagus" and "choking on air".  She reports a history of duodenal ulcers back in 2005.  She denies any NSAID use.  She has not taken any PPIs in a very long time.  Her last colonoscopy was also in 2005 and was normal per the patient.  She denies any weight loss rather her weight has been going up. She had an ultrasound in April 2022 which demonstrated a contracted gallbladder with suspected gallstones.  She apparently has seen surgery, and they have recommended cholecystectomy.   Past Medical History:  Diagnosis Date   Abnormal EKG    Anxiety    Phreesia 11/29/2020   Arthritis    Phreesia 11/29/2020   Barrett's esophagus    Breast cancer ([facility]) 2018   Left breast   Cancer ([facility])    Phreesia [id number]/[id number]   Complication of anesthesia    Family history of breast cancer    Family history of prostate cancer    Family history of thyroid cancer    Fatigue     Generalized headaches    IBS (irritable bowel syndrome)    Meniere disease    Osteopenia    PONV (postoperative nausea and vomiting)    Psoriasis    Tinnitus    left ear     Past Surgical History:  Procedure Laterality Date   BREAST SURGERY N/A    Phreesia 11/29/2020   DILATION AND CURETTAGE OF UTERUS     MASTECTOMY Left 2018   MASTECTOMY W/ SENTINEL NODE BIOPSY Left 08/03/2017   Procedure: LEFT MASTECTOMY WITH LEFT AXILLARY SENTINEL LYMPH NODE BIOPSY;  Surgeon: Alphonsa Overall, MD;  Location: [facility];  Service: General;  Laterality: Left;   skin cancer removal     TONSILLECTOMY     Family History  Problem Relation Age of Onset   Heart attack Father    Hypertension Father    Hypertension Mother    Healthy Maternal Aunt    Healthy Maternal Uncle    Breast cancer Paternal Aunt 69       bilateral   Prostate cancer Paternal Uncle    Healthy Paternal Aunt    Thyroid cancer Paternal Uncle    Healthy Paternal Uncle    Breast cancer Cousin 58       paternal first cousin   Breast cancer Cousin        paternal first cousin   Social History   Tobacco Use  Smoking status: Never    Passive exposure: Yes   Smokeless tobacco: Former  Substance Use Topics   Alcohol use: Yes    Comment: occas   Current Outpatient Medications  Medication Sig Dispense Refill   calcium carbonate (OS-CAL - DOSED IN MG OF ELEMENTAL CALCIUM) 1250 (500 Ca) MG tablet Take 1,200 tablets by mouth.     cholecalciferol (VITAMIN D3) 25 MCG (1000 UNIT) tablet Take 1,000 Units by mouth daily.     diazepam (VALIUM) 10 MG tablet TAKE ONE TABLET BY MOUTH EVERY 12 HOURS AS NEEDED FOR ANXIETY 30 tablet 2   HYDROcodone-acetaminophen (NORCO) 5-325 MG tablet Take 1 tablet by mouth every 6 (six) hours as needed for moderate pain. 30 tablet 0   hydrocortisone (ANUSOL-HC) 25 MG suppository Place 1 suppository (25 mg total) rectally 2 (two) times daily. 12 suppository 0   OVER THE COUNTER MEDICATION Take  1 tablet by mouth daily as needed. For dizziness. Medication name unknown per patient.     No current facility-administered medications for this visit.   Allergies  Allergen Reactions   Bupropion Other (See Comments)    Hallucinate Other reaction(s): hallucination   Chantix [Varenicline]     Other reaction(s): hallucination   Codeine Nausea And Vomiting     Review of Systems: All systems reviewed and negative except where noted in HPI.    No results found.  Physical Exam: BP 120/60   Pulse 72   Ht 5' (1.524 m)   Wt 119 lb (54 kg)   BMI 23.24 kg/m  Constitutional: Pleasant,well-developed, Caucasian female in no acute distress. HEENT: Normocephalic and atraumatic. Conjunctivae are normal. No scleral icterus. Cardiovascular: Normal rate, regular rhythm.  Pulmonary/chest: Effort normal and breath sounds normal. No wheezing, rales or rhonchi. Abdominal: Soft, nondistended, mild tenderness to palpation in the RUQ and epigastrium. Bowel sounds active throughout. There are no masses palpable. No hepatomegaly. Extremities: no edema Neurological: Alert and oriented to person place and time. Skin: Skin is warm and dry. No rashes noted. Psychiatric: Normal mood and affect. Behavior is normal.  CBC    Component Value Date/Time   WBC 8.0 08/28/2020 1229   RBC 4.81 08/28/2020 1229   HGB 13.9 08/28/2020 1229   HGB 14.1 05/24/2017 0825   HCT 42.0 08/28/2020 1229   HCT 41.6 05/24/2017 0825   PLT 411 (H) 08/28/2020 1229   PLT 272 05/24/2017 0825   MCV 87.3 08/28/2020 1229   MCV 87.5 05/24/2017 0825   MCH 28.9 08/28/2020 1229   MCHC 33.1 08/28/2020 1229   RDW 11.8 08/28/2020 1229   RDW 13.1 05/24/2017 0825   LYMPHSABS 2,704 08/28/2020 1229   LYMPHSABS 2.6 05/24/2017 0825   MONOABS 0.4 05/24/2017 0825   EOSABS 216 08/28/2020 1229   EOSABS 0.3 05/24/2017 0825   BASOSABS 56 08/28/2020 1229   BASOSABS 0.1 05/24/2017 0825    CMP     Component Value Date/Time   NA 136  08/28/2020 1229   NA 138 05/24/2017 0825   K 4.6 08/28/2020 1229   K 4.1 05/24/2017 0825   CL 97 (L) 08/28/2020 1229   CO2 28 08/28/2020 1229   CO2 27 05/24/2017 0825   GLUCOSE 91 08/28/2020 1229   GLUCOSE 98 05/24/2017 0825   BUN 8 08/28/2020 1229   BUN 6.0 (L) 05/24/2017 0825   CREATININE 0.83 08/28/2020 1229   CREATININE 0.8 05/24/2017 0825   CALCIUM 9.7 08/28/2020 1229   CALCIUM 9.8 05/24/2017 0825   PROT 7.1 08/28/2020 1229  PROT 7.1 05/24/2017 0825   ALBUMIN 4.1 05/24/2017 0825   AST 16 08/28/2020 1229   AST 22 05/24/2017 0825   ALT 9 08/28/2020 1229   ALT 19 05/24/2017 0825   ALKPHOS 67 05/24/2017 0825   BILITOT 0.3 08/28/2020 1229   BILITOT 0.48 05/24/2017 0825   GFRNONAA 75 08/28/2020 1229   GFRAA 87 08/28/2020 1229     ASSESSMENT AND PLAN: 65 year old female with a history of peptic ulcer disease, with several months of intense episodes of upper abdominal pain postprandially, as well as a chronic persistent abdominal discomfort and bloating and atypical dysphagia symptoms.  The attacks of acute abdominal pain do seem most consistent with symptomatic cholelithiasis, but given her history of peptic ulcer disease, I think performing upper endoscopy prior to cholecystectomy is very reasonable.  We will defer to the surgeon whether HIDA scan is necessary or not.  We will also confirm resolution of the previously noted ulcers seen many years ago.  She also has a diagnosis of Barrett's esophagus in her medical record.  The patient is not aware of this diagnosis.  We will reassess for Barrett's at the time of her upper endoscopy.  In the meantime, I recommended she start taking Protonix again to see if this helps with her abdominal pain.  Her irregular bowel habits and abdominal discomfort seem most consistent with irritable bowel syndrome which she has already been diagnosed with.  I recommended she start taking Metamucil on a daily basis to improve her stool bulk and regularity.   She is due for her screening colonoscopy.  Right upper quadrant pain - Seems most consistent with symptomatic cholelithiasis - EGD to exclude peptic ulcer disease - Follow-up with general surgeon  History of peptic ulcer disease/questionable history of Barrett's esophagus - EGD to assess for persistent peptic ulcer disease/Barrett's - Restart Protonix 40 mg once daily  -IBS mixed -Metamucil daily  Colon cancer screening - Schedule patient for screening colonoscopy at time of upper endoscopy  The details, risks (including bleeding, perforation, infection, missed lesions, medication reactions and possible hospitalization or surgery if complications occur), benefits, and alternatives to EGD/colonoscopy with possible biopsy, dilation and polypectomy were discussed with the patient and she consents to proceed.  Clyde Zarrella E. Candis Schatz, MD [facility]     CC: Susy Frizzle, MD

## 2021-08-24 NOTE — Patient Instructions (Signed)
If you are age 65 or older, your body mass index should be between 23-30. Your Body mass index is 23.24 kg/m. If this is out of the aforementioned range listed, please consider follow up with your Primary Care Provider.  If you are age 69 or younger, your body mass index should be between 19-25. Your Body mass index is 23.24 kg/m. If this is out of the aformentioned range listed, please consider follow up with your Primary Care Provider.   You have been scheduled for an endoscopy. Please follow written instructions given to you at your visit today. If you use inhalers (even only as needed), please bring them with you on the day of your procedure.  We have sent the following medications to your pharmacy for you to pick up at your convenience: Pantoprazole 40 mg    The [facility] providers would like to encourage you to use [facility] to communicate with providers for non-urgent requests or questions.  Due to long hold times on the telephone, sending your provider a message by [facility] may be a faster and more efficient way to get a response.  Please allow 48 business hours for a response.  Please remember that this is for non-urgent requests.   It was a pleasure to see you today!  Thank you for trusting me with your gastrointestinal care!    Scott E. Candis Schatz, MD

## 2021-09-14 ENCOUNTER — Ambulatory Visit (AMBULATORY_SURGERY_CENTER): Payer: BC Managed Care – PPO | Admitting: Gastroenterology

## 2021-09-14 ENCOUNTER — Encounter: Payer: Self-pay | Admitting: Gastroenterology

## 2021-09-14 ENCOUNTER — Other Ambulatory Visit: Payer: Self-pay

## 2021-09-14 VITALS — BP 135/78 | HR 76 | Temp 97.1°F | Resp 20 | Ht 60.0 in | Wt 119.0 lb

## 2021-09-14 DIAGNOSIS — K2970 Gastritis, unspecified, without bleeding: Secondary | ICD-10-CM | POA: Diagnosis not present

## 2021-09-14 DIAGNOSIS — K2950 Unspecified chronic gastritis without bleeding: Secondary | ICD-10-CM | POA: Diagnosis not present

## 2021-09-14 DIAGNOSIS — K621 Rectal polyp: Secondary | ICD-10-CM | POA: Diagnosis not present

## 2021-09-14 DIAGNOSIS — D128 Benign neoplasm of rectum: Secondary | ICD-10-CM

## 2021-09-14 DIAGNOSIS — Z1211 Encounter for screening for malignant neoplasm of colon: Secondary | ICD-10-CM

## 2021-09-14 DIAGNOSIS — K582 Mixed irritable bowel syndrome: Secondary | ICD-10-CM

## 2021-09-14 DIAGNOSIS — R1011 Right upper quadrant pain: Secondary | ICD-10-CM

## 2021-09-14 MED ORDER — SODIUM CHLORIDE 0.9 % IV SOLN: 500.0000 mL | Freq: Once | INTRAVENOUS | Status: AC

## 2021-09-14 NOTE — Patient Instructions (Signed)
Upper-Resume previous diet and continue current medications. Awaiting pathology results. Follow up with surgeon to discuss Cholecystectomy. Lower-Repeat Colonoscopy in 10 years for surveillance.  YOU HAD AN ENDOSCOPIC PROCEDURE TODAY AT [facility]:   Refer to the procedure report that was given to you for any specific questions about what was found during the examination.  If the procedure report does not answer your questions, please call your gastroenterologist to clarify.  If you requested that your care partner not be given the details of your procedure findings, then the procedure report has been included in a sealed envelope for you to review at your convenience later.  YOU SHOULD EXPECT: Some feelings of bloating in the abdomen. Passage of more gas than usual.  Walking can help get rid of the air that was put into your GI tract during the procedure and reduce the bloating. If you had a lower endoscopy (such as a colonoscopy or flexible sigmoidoscopy) you may notice spotting of blood in your stool or on the toilet paper. If you underwent a bowel prep for your procedure, you may not have a normal bowel movement for a few days.  Please Note:  You might notice some irritation and congestion in your nose or some drainage.  This is from the oxygen used during your procedure.  There is no need for concern and it should clear up in a day or so.  SYMPTOMS TO REPORT IMMEDIATELY:  Following lower endoscopy (colonoscopy or flexible sigmoidoscopy):  Excessive amounts of blood in the stool  Significant tenderness or worsening of abdominal pains  Swelling of the abdomen that is new, acute  Fever of 100F or higher  Following upper endoscopy (EGD)  Vomiting of blood or coffee ground material  New chest pain or pain under the shoulder blades  Painful or persistently difficult swallowing  New shortness of breath  Fever of 100F or higher  Black, tarry-looking stools  For urgent or  emergent issues, a gastroenterologist can be reached at any hour by calling [phone]. Do not use MyChart messaging for urgent concerns.    DIET:  We do recommend a small meal at first, but then you may proceed to your regular diet.  Drink plenty of fluids but you should avoid alcoholic beverages for 24 hours.  ACTIVITY:  You should plan to take it easy for the rest of today and you should NOT DRIVE or use heavy machinery until tomorrow (because of the sedation medicines used during the test).    FOLLOW UP: Our staff will call the number listed on your records 48-72 hours following your procedure to check on you and address any questions or concerns that you may have regarding the information given to you following your procedure. If we do not reach you, we will leave a message.  We will attempt to reach you two times.  During this call, we will ask if you have developed any symptoms of COVID 19. If you develop any symptoms (ie: fever, flu-like symptoms, shortness of breath, cough etc.) before then, please call [phone].  If you test positive for Covid 19 in the 2 weeks post procedure, please call and report this information to [place].    If any biopsies were taken you will be contacted by phone or by letter within the next 1-3 weeks.  Please call us at [phone] if you have not heard about the biopsies in 3 weeks.    SIGNATURES/CONFIDENTIALITY: You and/or your care partner have signed  paperwork which will be entered into your electronic medical record.  These signatures attest to the fact that that the information above on your After Visit Summary has been reviewed and is understood.  Full responsibility of the confidentiality of this discharge information lies with you and/or your care-partner.

## 2021-09-14 NOTE — Progress Notes (Signed)
1351 Ephedrine 10 mg given IV due to low BP, MD updated.

## 2021-09-14 NOTE — Op Note (Signed)
[facility] Patient Name: Monique [name] Procedure Date: 09/14/2021 1:16 PM MRN: [id number] Endoscopist: Nicki Reaper E. Candis Schatz , MD Age: 65 Referring MD:  Date of Birth: May 02, 1956 Gender: Female Account #: [id number] Procedure:                Upper GI endoscopy Indications:              Abdominal pain in the right upper quadrant Medicines:                Monitored Anesthesia Care Procedure:                Pre-Anesthesia Assessment:                           - Prior to the procedure, a History and Physical                            was performed, and patient medications and                            allergies were reviewed. The patient's tolerance of                            previous anesthesia was also reviewed. The risks                            and benefits of the procedure and the sedation                            options and risks were discussed with the patient.                            All questions were answered, and informed consent                            was obtained. Prior Anticoagulants: The patient has                            taken no previous anticoagulant or antiplatelet                            agents. ASA Grade Assessment: II - A patient with                            mild systemic disease. After reviewing the risks                            and benefits, the patient was deemed in                            satisfactory condition to undergo the procedure.                           After obtaining informed consent, the endoscope was  passed under direct vision. Throughout the                            procedure, the patient's blood pressure, pulse, and                            oxygen saturations were monitored continuously. The                            GIF HQ190 #[id number] was introduced through the                            mouth, and advanced to the third part of duodenum.                            The upper  GI endoscopy was accomplished without                            difficulty. The patient tolerated the procedure                            well. Scope In: Scope Out: Findings:                 The examined esophagus was normal.                           Diffuse nodular mucosa was found in the gastric                            body. Biopsies were taken with a cold forceps for                            histology. Estimated blood loss was minimal.                           The exam of the stomach was otherwise normal.                           Biopsies were taken with a cold forceps in the                            gastric antrum for Helicobacter pylori testing.                            Estimated blood loss was minimal.                           The examined duodenum was normal. Complications:            No immediate complications. Estimated Blood Loss:     Estimated blood loss was minimal. Impression:               - Normal esophagus.                           -  Nodular mucosa in the gastric body. Biopsied.                           - Normal examined duodenum.                           - Biopsies were taken with a cold forceps for                            Helicobacter pylori testing.                           - No endoscopic abnormalities to explain patient's                            episodic abdominal pain. Suspect pain is secondary                            to cholelithiasis. Recommendation:           - Patient has a contact number available for                            emergencies. The signs and symptoms of potential                            delayed complications were discussed with the                            patient. Return to normal activities tomorrow.                            Written discharge instructions were provided to the                            patient.                           - Resume previous diet.                           - Continue present  medications.                           - Await pathology results.                           - Follow up with Surgeon to discuss cholecystectomy. Lorean Ekstrand E. Candis Schatz, MD 09/14/2021 2:13:08 PM This report has been signed electronically.

## 2021-09-14 NOTE — Progress Notes (Signed)
History and Physical Interval Note: No significant changes in the patient's medical history or symptoms since her clinic appointment Sept 6th.   09/14/2021 1:18 PM  Monique [name]  has presented today for endoscopic procedure(s), with the diagnosis of  Encounter Diagnoses  Name Primary?   Right upper quadrant pain Yes   Irritable bowel syndrome with both constipation and diarrhea   .  The various methods of evaluation and treatment have been discussed with the patient and/or family. After consideration of risks, benefits and other options for treatment, the patient has consented to  the endoscopic procedure(s).   The patient's history has been reviewed, patient examined, no change in status, stable for endoscopic procedure(s).  I have reviewed the patient's chart and labs.  Questions were answered to the patient's satisfaction.     Monique [name] E. Candis Schatz, MD [facility]

## 2021-09-14 NOTE — Progress Notes (Signed)
Called to room to assist during endoscopic procedure.  Patient ID and intended procedure confirmed with present staff. Received instructions for my participation in the procedure from the performing physician.  

## 2021-09-14 NOTE — Progress Notes (Signed)
Report given to PACU, vss 

## 2021-09-14 NOTE — Op Note (Signed)
[facility] [facility] Patient Name: Monique [name] Procedure Date: 09/14/2021 1:15 PM MRN: [id number] Endoscopist: Nicki Reaper E. Candis Schatz , MD Age: 65 Referring MD:  Date of Birth: 1956-10-14 Gender: Female Account #: [id number] Procedure:                Colonoscopy Indications:              Screening for colorectal malignant neoplasm Medicines:                Monitored Anesthesia Care Procedure:                Pre-Anesthesia Assessment:                           - Prior to the procedure, a History and Physical                            was performed, and patient medications and                            allergies were reviewed. The patient's tolerance of                            previous anesthesia was also reviewed. The risks                            and benefits of the procedure and the sedation                            options and risks were discussed with the patient.                            All questions were answered, and informed consent                            was obtained. Prior Anticoagulants: The patient has                            taken no previous anticoagulant or antiplatelet                            agents. ASA Grade Assessment: II - A patient with                            mild systemic disease. After reviewing the risks                            and benefits, the patient was deemed in                            satisfactory condition to undergo the procedure.                           After obtaining informed consent, the colonoscope  was passed under direct vision. Throughout the                            procedure, the patient's blood pressure, pulse, and                            oxygen saturations were monitored continuously. The                            Olympus [id number]-[id number]) Colonoscope was                            introduced through the anus and advanced to the the                            terminal  ileum, with identification of the                            appendiceal orifice and IC valve. The colonoscopy                            was performed without difficulty. The patient                            tolerated the procedure well. The quality of the                            bowel preparation was excellent. The terminal                            ileum, ileocecal valve, appendiceal orifice, and                            rectum were photographed. Scope In: 1:54:01 PM Scope Out: 2:07:55 PM Scope Withdrawal Time: 0 hours 9 minutes 42 seconds  Total Procedure Duration: 0 hours 13 minutes 54 seconds  Findings:                 The perianal and digital rectal examinations were                            normal. Pertinent negatives include normal                            sphincter tone and no palpable rectal lesions.                           A 3 mm polyp was found in the distal rectum. The                            polyp was sessile. The polyp was removed with a                            cold snare. Resection and retrieval were complete.  Estimated blood loss was minimal.                           The exam was otherwise normal throughout the                            examined colon.                           The terminal ileum appeared normal.                           Anal papilla(e) were hypertrophied.                           No additional abnormalities were found on                            retroflexion. Complications:            No immediate complications. Estimated Blood Loss:     Estimated blood loss was minimal. Impression:               - One 3 mm polyp in the distal rectum, removed with                            a cold snare. Resected and retrieved.                           - The examined portion of the ileum was normal.                           - Anal papilla(e) were hypertrophied. Recommendation:           - Patient has a contact  number available for                            emergencies. The signs and symptoms of potential                            delayed complications were discussed with the                            patient. Return to normal activities tomorrow.                            Written discharge instructions were provided to the                            patient.                           - Resume previous diet.                           - Continue present medications.                           -  Await pathology results.                           - Repeat colonoscopy in 10 years for surveillance. Joliet Mallozzi E. Candis Schatz, MD 09/14/2021 2:16:07 PM This report has been signed electronically.

## 2021-09-14 NOTE — Progress Notes (Signed)
VS taken by C.W. 

## 2021-09-16 ENCOUNTER — Telehealth: Payer: Self-pay

## 2021-09-16 ENCOUNTER — Telehealth: Payer: Self-pay | Admitting: *Deleted

## 2021-09-16 NOTE — Telephone Encounter (Signed)
Attempted f/u call. No answer, left VM. 

## 2021-09-16 NOTE — Telephone Encounter (Signed)
  Follow up Call-  Call back number 09/14/2021  Post procedure Call Back phone  # [phone]  Permission to leave phone message Yes  Some recent data might be hidden     Patient questions:  Do you have a fever, pain , or abdominal swelling? No. Pain Score  0 *  Have you tolerated food without any problems? Yes.    Have you been able to return to your normal activities? Yes.    Do you have any questions about your discharge instructions: Diet   No. Medications  No. Follow up visit  No.  Do you have questions or concerns about your Care? No.  Actions: * If pain score is 4 or above: No action needed, pain <4.Have you developed a fever since your procedure? no  2.   Have you had an respiratory symptoms (SOB or cough) since your procedure? no  3.   Have you tested positive for COVID 19 since your procedure no  4.   Have you had any family members/close contacts diagnosed with the COVID 19 since your procedure?  no   If yes to any of these questions please route to Joylene John, RN and Joella Prince, RN

## 2021-09-24 ENCOUNTER — Encounter: Payer: Self-pay | Admitting: Gastroenterology

## 2021-11-08 ENCOUNTER — Other Ambulatory Visit: Payer: Self-pay | Admitting: Gastroenterology

## 2021-11-25 DIAGNOSIS — C50912 Malignant neoplasm of unspecified site of left female breast: Secondary | ICD-10-CM | POA: Diagnosis not present

## 2021-12-02 ENCOUNTER — Other Ambulatory Visit: Payer: Self-pay

## 2021-12-02 ENCOUNTER — Encounter: Payer: Self-pay | Admitting: Family Medicine

## 2021-12-02 ENCOUNTER — Ambulatory Visit (INDEPENDENT_AMBULATORY_CARE_PROVIDER_SITE_OTHER): Payer: Medicare Other | Admitting: Family Medicine

## 2021-12-02 VITALS — BP 128/62 | HR 54 | Resp 18 | Ht 60.0 in | Wt 120.0 lb

## 2021-12-02 DIAGNOSIS — R5381 Other malaise: Secondary | ICD-10-CM | POA: Diagnosis not present

## 2021-12-02 DIAGNOSIS — N941 Unspecified dyspareunia: Secondary | ICD-10-CM | POA: Insufficient documentation

## 2021-12-02 DIAGNOSIS — M8580 Other specified disorders of bone density and structure, unspecified site: Secondary | ICD-10-CM

## 2021-12-02 DIAGNOSIS — Z13220 Encounter for screening for lipoid disorders: Secondary | ICD-10-CM | POA: Diagnosis not present

## 2021-12-02 DIAGNOSIS — K625 Hemorrhage of anus and rectum: Secondary | ICD-10-CM | POA: Insufficient documentation

## 2021-12-02 DIAGNOSIS — Z0000 Encounter for general adult medical examination without abnormal findings: Secondary | ICD-10-CM

## 2021-12-02 DIAGNOSIS — N952 Postmenopausal atrophic vaginitis: Secondary | ICD-10-CM | POA: Insufficient documentation

## 2021-12-02 DIAGNOSIS — Z853 Personal history of malignant neoplasm of breast: Secondary | ICD-10-CM | POA: Insufficient documentation

## 2021-12-02 DIAGNOSIS — Z136 Encounter for screening for cardiovascular disorders: Secondary | ICD-10-CM | POA: Diagnosis not present

## 2021-12-02 DIAGNOSIS — R350 Frequency of micturition: Secondary | ICD-10-CM

## 2021-12-02 DIAGNOSIS — L409 Psoriasis, unspecified: Secondary | ICD-10-CM | POA: Insufficient documentation

## 2021-12-02 NOTE — Progress Notes (Signed)
Subjective:    Patient ID: Monique [name], female    DOB: 1956-07-17, 65 y.o.   MRN: [id number]  HPI Patient is a very pleasant 65 year old Caucasian female here today for complete physical exam.  She has a history of left-sided breast cancer status post left mastectomy.  Her colonoscopy was performed earlier this year and only showed 1 hyperplastic polyp.  Therefore her colonoscopy is up-to-date and is not necessary again for 10 years.  She is overdue for a Pap smear and she request that I schedule her to see a gynecologist.  Her mammogram was performed in June and was normal.  She is due for a flu shot which she declines.  She is due for a COVID shot which she declines.  She is not yet due for Pneumovax 23 if she is not 65.  She has not yet due for a bone density test which I will do next year.  She is due for the shingles vaccine which I recommended.  Of note she reports increased urinary frequency and urgency and foul-smelling urine.  However this tends to be a chronic problem that occurs 5-6 times a year Past Medical History:  Diagnosis Date   Abnormal EKG    Anxiety    Phreesia 11/29/2020   Arthritis    Phreesia 11/29/2020   Barrett's esophagus    Breast cancer ([place]) 2018   Left breast   Cancer ([place])    Phreesia 43/32/9518   Complication of anesthesia    Depression    Family history of breast cancer    Family history of prostate cancer    Family history of thyroid cancer    Fatigue    Generalized headaches    IBS (irritable bowel syndrome)    Meniere disease    Osteopenia    PONV (postoperative nausea and vomiting)    Psoriasis    Tinnitus    left ear   Past Surgical History:  Procedure Laterality Date   BREAST SURGERY N/A    Phreesia 11/29/2020   DILATION AND CURETTAGE OF UTERUS     MASTECTOMY Left 2018   MASTECTOMY W/ SENTINEL NODE BIOPSY Left 08/03/2017   Procedure: LEFT MASTECTOMY WITH LEFT AXILLARY SENTINEL LYMPH NODE BIOPSY;  Surgeon: Alphonsa Overall, MD;   Location: [facility];  Service: General;  Laterality: Left;   skin cancer removal     TONSILLECTOMY     Current Outpatient Medications on File Prior to Visit  Medication Sig Dispense Refill   calcium carbonate (OS-CAL - DOSED IN MG OF ELEMENTAL CALCIUM) 1250 (500 Ca) MG tablet Take 1,200 tablets by mouth.     cholecalciferol (VITAMIN D3) 25 MCG (1000 UNIT) tablet Take 1,000 Units by mouth daily.     diazepam (VALIUM) 10 MG tablet TAKE ONE TABLET BY MOUTH EVERY 12 HOURS AS NEEDED FOR ANXIETY 30 tablet 2   HYDROcodone-acetaminophen (NORCO) 5-325 MG tablet Take 1 tablet by mouth every 6 (six) hours as needed for moderate pain. 30 tablet 0   hydrocortisone (ANUSOL-HC) 25 MG suppository Place 1 suppository (25 mg total) rectally 2 (two) times daily. 12 suppository 0   OVER THE COUNTER MEDICATION Take 1 tablet by mouth daily as needed. For dizziness. Medication name unknown per patient.     pantoprazole (PROTONIX) 40 MG tablet TAKE ONE TABLET BY MOUTH DAILY 60 tablet 1   Current Facility-Administered Medications on File Prior to Visit  Medication Dose Route Frequency Provider Last Rate Last Admin   0.9 %  sodium chloride infusion  500 mL Intravenous Once Daryel November, MD       Allergies  Allergen Reactions   Bupropion Other (See Comments)    Hallucinate Other reaction(s): hallucination   Chantix [Varenicline]     Other reaction(s): hallucination   Codeine Nausea And Vomiting   Social History   Socioeconomic History   Marital status: Married    Spouse name: Not on file   Number of children: Not on file   Years of education: Not on file   Highest education level: Not on file  Occupational History   Not on file  Tobacco Use   Smoking status: Never    Passive exposure: Yes   Smokeless tobacco: Former  Scientific laboratory technician Use: Never used  Substance and Sexual Activity   Alcohol use: Yes    Comment: occas   Drug use: Never   Sexual activity: Not on file   Other Topics Concern   Not on file  Social History Narrative   Not on file   Social Determinants of Health   Financial Resource Strain: Not on file  Food Insecurity: Not on file  Transportation Needs: Not on file  Physical Activity: Not on file  Stress: Not on file  Social Connections: Not on file  Intimate Partner Violence: Not on file      Review of Systems  All other systems reviewed and are negative.     Objective:   Physical Exam Vitals reviewed.  Constitutional:      General: She is not in acute distress.    Appearance: She is well-developed. She is not diaphoretic.  HENT:     Head: Normocephalic and atraumatic.     Right Ear: External ear normal.     Left Ear: External ear normal.     Nose: Nose normal.     Mouth/Throat:     Pharynx: No oropharyngeal exudate.  Eyes:     General: No scleral icterus.       Right eye: No discharge.        Left eye: No discharge.     Conjunctiva/sclera: Conjunctivae normal.     Pupils: Pupils are equal, round, and reactive to light.  Neck:     Thyroid: No thyromegaly.     Vascular: No JVD.     Trachea: No tracheal deviation.  Cardiovascular:     Rate and Rhythm: Regular rhythm.     Heart sounds: Normal heart sounds. No murmur heard.   No friction rub. No gallop.  Pulmonary:     Effort: Pulmonary effort is normal. No respiratory distress.     Breath sounds: Normal breath sounds. No stridor. No wheezing or rales.  Chest:     Chest wall: No tenderness.  Abdominal:     General: Bowel sounds are normal. There is no distension.     Palpations: Abdomen is soft. There is no mass.     Tenderness: There is no abdominal tenderness. There is no guarding or rebound.     Hernia: No hernia is present.  Musculoskeletal:        General: No deformity.     Cervical back: Normal range of motion and neck supple.  Lymphadenopathy:     Cervical: No cervical adenopathy.  Skin:    Coloration: Skin is not pale.     Findings: No erythema  or rash.  Neurological:     Mental Status: She is alert and oriented to person, place, and time.  Cranial Nerves: No cranial nerve deficit.     Motor: No abnormal muscle tone.     Coordination: Coordination normal.     Deep Tendon Reflexes: Reflexes normal.  Psychiatric:        Behavior: Behavior normal.        Thought Content: Thought content normal.        Judgment: Judgment normal.          Assessment & Plan:  General medical exam - Plan: CBC with Differential/Platelet, Lipid panel, COMPLETE METABOLIC PANEL WITH GFR, Ambulatory referral to Gynecology  Osteopenia, unspecified location - Plan: CBC with Differential/Platelet, Lipid panel, COMPLETE METABOLIC PANEL WITH GFR  History of breast cancer  Urinary frequency - Plan: Urinalysis, Routine w reflex microscopic, Urine Culture Recommended a flu shot, shingles vaccine, COVID shot, and Pneumovax 23 when she turns 65.  Patient defers all of these shots at the present time.  Mammogram and colonoscopy are up-to-date.  I will consult a gynecologist for Pap smear.  I suspect that she has overactive bladder.  However I will check a urinalysis and a urine culture.  If negative I will treat the patient presumptively for overactive bladder.  Recommend a bone density test next year.

## 2021-12-03 LAB — COMPLETE METABOLIC PANEL WITH GFR
AG Ratio: 1.9 (calc) (ref 1.0–2.5)
ALT: 12 U/L (ref 6–29)
AST: 16 U/L (ref 10–35)
Albumin: 4.4 g/dL (ref 3.6–5.1)
Alkaline phosphatase (APISO): 70 U/L (ref 37–153)
BUN: 10 mg/dL (ref 7–25)
CO2: 29 mmol/L (ref 20–32)
Calcium: 9.3 mg/dL (ref 8.6–10.4)
Chloride: 99 mmol/L (ref 98–110)
Creat: 0.84 mg/dL (ref 0.50–1.05)
Globulin: 2.3 g/dL (calc) (ref 1.9–3.7)
Glucose, Bld: 89 mg/dL (ref 65–99)
Potassium: 4.2 mmol/L (ref 3.5–5.3)
Sodium: 137 mmol/L (ref 135–146)
Total Bilirubin: 0.5 mg/dL (ref 0.2–1.2)
Total Protein: 6.7 g/dL (ref 6.1–8.1)
eGFR: 78 mL/min/{1.73_m2} (ref >=60)

## 2021-12-03 LAB — CBC WITH DIFFERENTIAL/PLATELET
Absolute Monocytes: 353 cells/uL (ref 200–950)
Basophils Absolute: 50 cells/uL (ref 0–200)
Basophils Relative: 0.8 %
Eosinophils Absolute: 391 cells/uL (ref 15–500)
Eosinophils Relative: 6.2 %
HCT: 40.1 % (ref 35.0–45.0)
Hemoglobin: 13.2 g/dL (ref 11.7–15.5)
Lymphs Abs: 2451 cells/uL (ref 850–3900)
MCH: 28.7 pg (ref 27.0–33.0)
MCHC: 32.9 g/dL (ref 32.0–36.0)
MCV: 87.2 fL (ref 80.0–100.0)
MPV: 10.4 fL (ref 7.5–12.5)
Monocytes Relative: 5.6 %
Neutro Abs: 3056 cells/uL (ref 1500–7800)
Neutrophils Relative %: 48.5 %
Platelets: 323 10*3/uL (ref 140–400)
RBC: 4.6 10*6/uL (ref 3.80–5.10)
RDW: 12 % (ref 11.0–15.0)
Total Lymphocyte: 38.9 %
WBC: 6.3 10*3/uL (ref 3.8–10.8)

## 2021-12-03 LAB — URINALYSIS, ROUTINE W REFLEX MICROSCOPIC
Bilirubin Urine: NEGATIVE
Glucose, UA: NEGATIVE
Hgb urine dipstick: NEGATIVE
Ketones, ur: NEGATIVE
Leukocytes,Ua: NEGATIVE
Nitrite: NEGATIVE
Protein, ur: NEGATIVE
Specific Gravity, Urine: 1.005 (ref 1.001–1.035)
pH: 6.5 (ref 5.0–8.0)

## 2021-12-03 LAB — LIPID PANEL
Cholesterol: 195 mg/dL (ref <200)
HDL: 65 mg/dL (ref >=50)
LDL Cholesterol (Calc): 112 mg/dL (calc) — ABNORMAL HIGH
Non-HDL Cholesterol (Calc): 130 mg/dL (calc) — ABNORMAL HIGH (ref <130)
Total CHOL/HDL Ratio: 3 (calc) (ref <5.0)
Triglycerides: 85 mg/dL (ref <150)

## 2021-12-03 LAB — URINE CULTURE
MICRO NUMBER:: 12761991
Result:: NO GROWTH
SPECIMEN QUALITY:: ADEQUATE

## 2021-12-28 ENCOUNTER — Encounter: Payer: Self-pay | Admitting: Family Medicine

## 2021-12-28 ENCOUNTER — Other Ambulatory Visit: Payer: Self-pay | Admitting: Family Medicine

## 2021-12-28 DIAGNOSIS — Z122 Encounter for screening for malignant neoplasm of respiratory organs: Secondary | ICD-10-CM

## 2022-01-02 ENCOUNTER — Other Ambulatory Visit: Payer: Self-pay | Admitting: Family Medicine

## 2022-01-03 ENCOUNTER — Other Ambulatory Visit: Payer: Self-pay

## 2022-01-03 NOTE — Telephone Encounter (Signed)
Valium refill reqeust.  Last seen 12/02/2021, last filled 07/15/2021.

## 2022-01-03 NOTE — Telephone Encounter (Signed)
LOV 12/02/21 Last refill 03/18/21, #30, 0 refills  (for RUQ pain)  Please review, thanks!

## 2022-01-04 MED ORDER — HYDROCODONE-ACETAMINOPHEN 5-325 MG PO TABS: 1.0000 | ORAL_TABLET | Freq: Four times a day (QID) | ORAL | 0 refills | Status: DC | PRN

## 2022-01-11 ENCOUNTER — Ambulatory Visit (INDEPENDENT_AMBULATORY_CARE_PROVIDER_SITE_OTHER): Payer: Medicare Other | Admitting: Obstetrics & Gynecology

## 2022-01-11 ENCOUNTER — Other Ambulatory Visit: Payer: Self-pay

## 2022-01-11 ENCOUNTER — Encounter: Payer: Self-pay | Admitting: Obstetrics & Gynecology

## 2022-01-11 ENCOUNTER — Other Ambulatory Visit (HOSPITAL_COMMUNITY)
Admission: RE | Admit: 2022-01-11 | Discharge: 2022-01-11 | Disposition: A | Discharge status: Home / Self Care | Payer: Medicare Other | Source: Ambulatory Visit | Attending: Obstetrics & Gynecology | Admitting: Obstetrics & Gynecology

## 2022-01-11 VITALS — BP 119/67 | HR 59 | Ht 60.5 in | Wt 118.6 lb

## 2022-01-11 DIAGNOSIS — Z1151 Encounter for screening for human papillomavirus (HPV): Secondary | ICD-10-CM | POA: Insufficient documentation

## 2022-01-11 DIAGNOSIS — Z01419 Encounter for gynecological examination (general) (routine) without abnormal findings: Secondary | ICD-10-CM | POA: Diagnosis present

## 2022-01-11 DIAGNOSIS — Z124 Encounter for screening for malignant neoplasm of cervix: Secondary | ICD-10-CM

## 2022-01-11 DIAGNOSIS — N941 Unspecified dyspareunia: Secondary | ICD-10-CM | POA: Diagnosis not present

## 2022-01-11 DIAGNOSIS — Z853 Personal history of malignant neoplasm of breast: Secondary | ICD-10-CM

## 2022-01-11 DIAGNOSIS — L409 Psoriasis, unspecified: Secondary | ICD-10-CM

## 2022-01-11 NOTE — Progress Notes (Signed)
GYN EXAM Patient name: Monique [name] MRN [id number]  Date of birth: 1956/02/10 Chief Complaint:   Gynecologic Exam  History of Present Illness:   Monique [name] is a 66 y.o. PM  female with h/o breast Ca being seen today for a routine well-woman exam.   Prior [facility] patient, []  plan to obtain records  -Psoriasis- notes vulvar itching and irritation.  Tried to use her "psoriasis cream" but it seemed to make the problem worse  -Dyspareunia- This has been a long-standing issue.  She is not currently using anything.  Additionally she worries about recurrent UTIs, which used to happen when she was sexually active.  No LMP recorded. Patient is postmenopausal.  Last pap 2018.  Last mammogram: 12/2020- [facility]. Last colonoscopy: 08/2021  Depression screen [facility] 2/9 01/11/2022 12/02/2021 12/01/2020 11/29/2019 11/23/2018  Decreased Interest 1 0 0 0 2  Down, Depressed, Hopeless 1 0 0 0 2  PHQ - 2 Score 2 0 0 0 4  Altered sleeping 0 0 0 - 1  Tired, decreased energy 1 0 0 - 2  Change in appetite 1 0 0 - 2  Feeling bad or failure about yourself  0 0 0 - 2  Trouble concentrating 0 0 0 - 1  Moving slowly or fidgety/restless 0 0 0 - 1  Suicidal thoughts 0 0 0 - 0  PHQ-9 Score 4 0 0 - 13  Difficult doing work/chores - Not difficult at all Not difficult at all - Somewhat difficult      Review of Systems:   Pertinent items are noted in HPI Denies any headaches, blurred vision, fatigue, shortness of breath, chest pain, abdominal pain, bowel movements  Pertinent History Reviewed:  Reviewed past medical,surgical, social and family history.  Reviewed problem list, medications and allergies. Physical Assessment:   Vitals:   01/11/22 1016  BP: 119/67  Pulse: (!) 59  Weight: 118 lb 9.6 oz (53.8 kg)  Height: 5' 0.5" (1.537 m)  Body mass index is 22.78 kg/m.        Physical Examination:   General appearance - well appearing, and in no distress  Mental status - alert, oriented to person,  place, and time  Psych:  She has a normal mood and affect  Skin - warm and dry, normal color, no suspicious lesions noted  Chest - effort normal, all lung fields clear to auscultation bilaterally  Heart - normal rate and regular rhythm  Neck:  midline trachea, no thyromegaly or nodules  Breasts - left mastectomy- no abnormalities noted along scar, right breast-appears normal, no suspicious masses, no skin or nipple changes or  axillary nodes  Abdomen - soft, nontender, nondistended, no masses or organomegaly  Pelvic - VULVA: thick red patches noted- bilateral labia- consistent with psoriasis, VAGINA: normal appearing vagina with normal color and discharge, no lesions  CERVIX: normal appearing cervix without discharge or lesions, no CMT- atrophic changes noted  Thin prep pap is done with HR HPV cotesting  UTERUS: uterus is felt to be normal size, shape, consistency and nontender   ADNEXA: No adnexal masses or tenderness noted.  Extremities:  No swelling or varicosities noted  Chaperone:  Dr. Oleh Genin present for exam      Assessment & Plan:  1) Preventive Screening -pap collected, reviewed guidelines, further pap smears not indicated -breast cancer screening up to date  2) Psoriasis -pt to bring in prior topical medication- may need stronger steroid for vulvar itching  3) Dyspareunia -Records reviewed- breast  Ca with 100% ER positive- unfortunately she is not a candidate for estrogen therapy -For now patient is to try OTC lubricants -Should she note symptoms of UTI, RTC for UA/testing  Meds: No orders of the defined types were placed in this encounter.   Follow-up: Return in about 2 months (around 03/11/2022) for 2-10mo treatment follow-up/ []  please sign for [facility] records.   Janyth Pupa, DO Attending [place], [facility] for [employer], [place]

## 2022-01-12 LAB — CYTOLOGY - PAP
Adequacy: ABSENT
Comment: NEGATIVE
Diagnosis: NEGATIVE
High risk HPV: NEGATIVE

## 2022-01-19 ENCOUNTER — Ambulatory Visit
Admission: RE | Admit: 2022-01-19 | Discharge: 2022-01-19 | Disposition: A | Discharge status: Home / Self Care | Payer: Medicare Other | Source: Ambulatory Visit | Attending: Family Medicine | Admitting: Family Medicine

## 2022-01-19 DIAGNOSIS — Z122 Encounter for screening for malignant neoplasm of respiratory organs: Secondary | ICD-10-CM

## 2022-01-19 DIAGNOSIS — Z87891 Personal history of nicotine dependence: Secondary | ICD-10-CM | POA: Diagnosis not present

## 2022-01-24 ENCOUNTER — Other Ambulatory Visit: Payer: Self-pay | Admitting: Family Medicine

## 2022-01-24 DIAGNOSIS — R918 Other nonspecific abnormal finding of lung field: Secondary | ICD-10-CM

## 2022-02-16 ENCOUNTER — Telehealth: Payer: Self-pay

## 2022-02-16 ENCOUNTER — Ambulatory Visit
Admission: RE | Admit: 2022-02-16 | Discharge: 2022-02-16 | Disposition: A | Discharge status: Home / Self Care | Payer: Medicare Other | Source: Ambulatory Visit | Attending: Family Medicine | Admitting: Family Medicine

## 2022-02-16 DIAGNOSIS — R918 Other nonspecific abnormal finding of lung field: Secondary | ICD-10-CM

## 2022-02-16 NOTE — Telephone Encounter (Signed)
[facility] called to report patient presented for CT chest scan today. They attempted to start IV line x2 for contrast, and we unsuccessful. Patient declined further attempts and procedure was then canceled. They recommend ordering next CT to be done at hospital. ? ?[place], thanks! ?

## 2022-02-17 ENCOUNTER — Other Ambulatory Visit: Payer: Self-pay | Admitting: Family Medicine

## 2022-02-17 DIAGNOSIS — R918 Other nonspecific abnormal finding of lung field: Secondary | ICD-10-CM

## 2022-02-21 ENCOUNTER — Other Ambulatory Visit: Payer: Self-pay | Admitting: Family Medicine

## 2022-02-21 DIAGNOSIS — Z1231 Encounter for screening mammogram for malignant neoplasm of breast: Secondary | ICD-10-CM

## 2022-02-28 ENCOUNTER — Other Ambulatory Visit: Payer: Self-pay

## 2022-02-28 ENCOUNTER — Ambulatory Visit (HOSPITAL_COMMUNITY)
Admission: RE | Admit: 2022-02-28 | Discharge: 2022-02-28 | Disposition: A | Discharge status: Home / Self Care | Payer: Medicare Other | Source: Ambulatory Visit | Attending: Family Medicine | Admitting: Family Medicine

## 2022-02-28 ENCOUNTER — Encounter (HOSPITAL_COMMUNITY): Payer: Self-pay

## 2022-02-28 DIAGNOSIS — R918 Other nonspecific abnormal finding of lung field: Secondary | ICD-10-CM

## 2022-02-28 NOTE — Discharge Instructions (Signed)
Monique [name]    [id number]   1956-10-29 ? ?Type of procedure: CT Chest with ? ?02/28/2022 ? ?During your examination at [facility] some of the x-ray dye leaked into the tissues around the vein that the x-ray dye was given through. ? ?What should you do? ? ?Apply ice 20 minutes four times a day for three days. ? ?Keep the affected extremity elevated above the rest of your body for 48 hours. ? ?If you develop any of the following signs or symptoms, please contact Radiology at [phone] or [phone]. ?Increased pain ?Increasing swelling  ?Change in sensation (ex. Numbness, tingling) ?Development of redness or increase in warmth around the affected area ?Increasing hardness ?Blistering ? ? ?I have read and understand these instructions.  Any questions I raised have been discussed to my satisfaction.  I understand that failure to follow these instructions may result in additional complications. ? ? ? ? ? ? ? ? ? ? ?

## 2022-03-01 ENCOUNTER — Other Ambulatory Visit: Payer: Self-pay | Admitting: Family Medicine

## 2022-03-01 DIAGNOSIS — R918 Other nonspecific abnormal finding of lung field: Secondary | ICD-10-CM

## 2022-03-02 ENCOUNTER — Other Ambulatory Visit: Payer: Self-pay | Admitting: Family Medicine

## 2022-03-02 NOTE — Telephone Encounter (Signed)
Received eFax from pharmacy to request refill of ? ?diazepam (VALIUM) 10 MG tablet ? ? ?Rx [id number] either out of refills or expired.  ? ?Pharmacy fax received from: ? ?[facility] [id number] [place], [place]  ?[address] [place], [place] [address]  ?Phone:  [phone]  Fax:  [phone]  ? ?Please advise pharmacist.  ?

## 2022-03-02 NOTE — Telephone Encounter (Signed)
LOV 12/02/21 ?Last refill 01/03/22, #30, 0 refills ? ?Please review, thanks! ? ?

## 2022-03-03 ENCOUNTER — Ambulatory Visit: Payer: Medicare Other | Admitting: Obstetrics & Gynecology

## 2022-03-03 MED ORDER — DIAZEPAM 10 MG PO TABS: 10.0000 mg | ORAL_TABLET | Freq: Two times a day (BID) | ORAL | 0 refills | Status: DC | PRN

## 2022-03-14 ENCOUNTER — Other Ambulatory Visit: Payer: Self-pay

## 2022-03-14 ENCOUNTER — Ambulatory Visit: Payer: Medicare Other | Admitting: Pulmonary Disease

## 2022-03-14 ENCOUNTER — Encounter: Payer: Self-pay | Admitting: Pulmonary Disease

## 2022-03-14 VITALS — BP 110/62 | HR 55 | Temp 98.2°F | Ht 60.0 in | Wt 122.2 lb

## 2022-03-14 DIAGNOSIS — R918 Other nonspecific abnormal finding of lung field: Secondary | ICD-10-CM | POA: Diagnosis not present

## 2022-03-14 DIAGNOSIS — Z853 Personal history of malignant neoplasm of breast: Secondary | ICD-10-CM

## 2022-03-14 DIAGNOSIS — Z720 Tobacco use: Secondary | ICD-10-CM

## 2022-03-14 NOTE — Progress Notes (Signed)
? ?Synopsis: Referred in multiple lung nodules, march 2023 by Susy Frizzle, MD ? ?Subjective:  ? ?PATIENT ID: Monique [name] GENDER: female DOB: 1956/06/22, MRN: [id number] ? ?Chief Complaint  ?Patient presents with  ? Consult  ?  Patient is here to talk about lung mass  ? ? ?This is a 66 year old female, past medical history of left breast cancer in 2018 status post mastectomy followed by antiestrogen oral therapy.  She was only able to tolerate for this for short period and so she stopped.She also has been a longtime smoker.  She was enrolled in lung cancer screening CT.  She had this completed a few weeks ago in February.  This revealed a possible obstruction in the anterior segment of the right upper lobe with associated postobstructive atelectasis.  Additionally there was other small nodules in the lung.  She attempted to get a contrasted CT but they were unable to get the IV for contrast and this leaked into the arm.  So she was rescheduled.  She was referred here for evaluation of bilateral pulmonary nodules and abnormal lung cancer screening CT. ? ? ?Oncology History  ?Malignant neoplasm of upper-outer quadrant of left breast in female, estrogen receptor positive ([place])  ?05/09/2017 Mammogram  ? Diagnostic Mammogram 05/09/17 ?IMPRESSION: ?1. 2.9 x 2.8 x 1.8 cm mass with imaging features highly suspicious ?for malignancy in the 12:30 o'clock position of the left breast 2 cm from the nipple. ?2. Left axillary lymph node measuring up to 4.2 mm in maximum thicknes with mild, lobulated and slightly ?irregular cortical thickening. This is suspicious for a metastatic ?node. ? ?  ?05/12/2017 Initial Diagnosis  ? Palpable left breast mass with nipple inversion, mammogram revealed mass at 12:30 position 2.9 cm with suspicious lymph node in axilla. Biopsy revealed IDC grade 2, ER 100%, PR 95%, Ki-67 15%, HER-2 negative ratio 1.68; lymph node biopsy benign, T2 N0 stage II a clinical stage ?  ?05/13/2017 Oncotype  testing  ? 5/5%, low risk ?  ?06/05/2017 Genetic Testing  ? 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  The Hereditary Gene Panel offered by [facility] includes sequencing and/or deletion duplication testing of the following 46 genes: APC, ATM, AXIN2, BARD1, BMPR1A, BRCA1, BRCA2, BRIP1, CDH1, CDKN2A (p14ARF), CDKN2A (p16INK4a), CHEK2, CTNNA1, DICER1, EPCAM (Deletion/duplication testing only), GREM1 (promoter region deletion/duplication testing only), KIT, MEN1, MLH1, MSH2, MSH3, MSH6, MUTYH, NBN, NF1, NHTL1, PALB2, PDGFRA, PMS2, POLD1, POLE, PTEN, RAD50, RAD51C, RAD51D, SDHB, SDHC, SDHD, SMAD4, SMARCA4. STK11, TP53, TSC1, TSC2, and VHL.  The following genes were evaluated for sequence changes only: SDHA and HOXB13 c.251G>A variant only.  The report date is June 05, 2017. ? ?  ?08/03/2017 Surgery  ? Lt Mastectomy: IDC grade 2; 3.2 cm, margins Neg, ER 100%, PR 95%, Ki 67 15% Her 2 Neg Ratio 1.68; 0/2 LN Neg, T2N0 Stage 1A ?  ?12/06/2017 -  Anti-estrogen oral therapy  ? Tamoxifen 20 mg once daily to start 12/06/17 for 5 years.  ? ?-She was previously on Anastroole for 3 weeks in 05/2017. Stopped due to poor toleration from joint pain.  ?  ? ? ? ?Past Medical History:  ?Diagnosis Date  ? Abnormal EKG   ? Anxiety   ? Phreesia 11/29/2020  ? Arthritis   ? [employer]reesia 11/29/2020  ? Barrett's esophagus   ? Breast cancer ([place]) 2018  ? Left breast  ? Cancer ([place])   ? Phreesia 11/29/2020  ? Complication of anesthesia   ?  Depression   ? Family history of breast cancer   ? Family history of prostate cancer   ? Family history of thyroid cancer   ? Fatigue   ? Generalized headaches   ? IBS (irritable bowel syndrome)   ? Meniere disease   ? Osteopenia   ? PONV (postoperative nausea and vomiting)   ? Psoriasis   ? Tinnitus   ? left ear  ?  ? ?Family History  ?Problem Relation Age of Onset  ? Hypertension Mother   ? Heart attack Father   ? Hypertension Father   ? Healthy Maternal Aunt   ? Healthy  Maternal Uncle   ? Breast cancer Paternal Aunt 71  ?     bilateral  ? Healthy Paternal Aunt   ? Prostate cancer Paternal Uncle   ? Thyroid cancer Paternal Uncle   ? Healthy Paternal Uncle   ? Breast cancer Cousin 88  ?     paternal first cousin  ? Breast cancer Cousin   ?     paternal first cousin  ? Colon cancer Neg Hx   ? Esophageal cancer Neg Hx   ? Rectal cancer Neg Hx   ? Stomach cancer Neg Hx   ?  ? ?Past Surgical History:  ?Procedure Laterality Date  ? BREAST SURGERY N/A   ? [employer]reesia 11/29/2020  ? DILATION AND CURETTAGE OF UTERUS    ? MASTECTOMY Left 2018  ? MASTECTOMY W/ SENTINEL NODE BIOPSY Left 08/03/2017  ? Procedure: LEFT MASTECTOMY WITH LEFT AXILLARY SENTINEL LYMPH NODE BIOPSY;  Surgeon: Alphonsa Overall, MD;  Location: [facility];  Service: General;  Laterality: Left;  ? skin cancer removal    ? TONSILLECTOMY    ? ? ?Social History  ? ?Socioeconomic History  ? Marital status: Married  ?  Spouse name: Not on file  ? Number of children: Not on file  ? Years of education: Not on file  ? Highest education level: Not on file  ?Occupational History  ? Not on file  ?Tobacco Use  ? Smoking status: Former  ?  Packs/day: 0.50  ?  Types: Cigarettes  ?  Quit date: 2018  ?  Years since quitting: 5.2  ?  Passive exposure: Yes  ? Smokeless tobacco: Never  ?Vaping Use  ? Vaping Use: Never used  ?Substance and Sexual Activity  ? Alcohol use: Yes  ?  Comment: occas  ? Drug use: Never  ? Sexual activity: Not Currently  ?  Birth control/protection: Post-menopausal  ?Other Topics Concern  ? Not on file  ?Social History Narrative  ? Not on file  ? ?Social Determinants of Health  ? ?Financial Resource Strain: Low Risk   ? Difficulty of Paying Living Expenses: Not very hard  ?Food Insecurity: Food Insecurity Present  ? Worried About Charity fundraiser in the Last Year: Sometimes true  ? Ran Out of Food in the Last Year: Never true  ?Transportation Needs: No Transportation Needs  ? Lack of Transportation (Medical):  No  ? Lack of Transportation (Non-Medical): No  ?Physical Activity: Sufficiently Active  ? Days of Exercise per Week: 5 days  ? Minutes of Exercise per Session: 30 min  ?Stress: Stress Concern Present  ? Feeling of Stress : Rather much  ?Social Connections: Moderately Isolated  ? Frequency of Communication with Friends and Family: More than three times a week  ? Frequency of Social Gatherings with Friends and Family: Once a week  ? Attends Religious  Services: Never  ? Active Member of Clubs or Organizations: No  ? Attends Archivist Meetings: Never  ? Marital Status: Married  ?Intimate Partner Violence: Not At Risk  ? Fear of Current or Ex-Partner: No  ? Emotionally Abused: No  ? Physically Abused: No  ? Sexually Abused: No  ?  ? ?Allergies  ?Allergen Reactions  ? Bupropion Other (See Comments)  ?  Hallucinate ?Other reaction(s): hallucination  ? Chantix [Varenicline]   ?  Other reaction(s): hallucination  ? Codeine Nausea And Vomiting  ?  ? ?Outpatient Medications Prior to Visit  ?Medication Sig Dispense Refill  ? APPLE CIDER VINEGAR PO Take by mouth.    ? calcium carbonate (OS-CAL - DOSED IN MG OF ELEMENTAL CALCIUM) 1250 (500 Ca) MG tablet Take 1,200 tablets by mouth.    ? CRANBERRY PO Take by mouth.    ? diazepam (VALIUM) 10 MG tablet Take 1 tablet (10 mg total) by mouth every 12 (twelve) hours as needed for anxiety. 30 tablet 0  ? HYDROcodone-acetaminophen (NORCO) 5-325 MG tablet Take 1 tablet by mouth every 6 (six) hours as needed for moderate pain. 30 tablet 0  ? hydrocortisone (ANUSOL-HC) 25 MG suppository Place 1 suppository (25 mg total) rectally 2 (two) times daily. (Patient taking differently: Place 25 mg rectally 2 (two) times daily as needed.) 12 suppository 0  ? Multiple Vitamins-Minerals (AIRBORNE PO) Take by mouth.    ? ?Facility-Administered Medications Prior to Visit  ?Medication Dose Route Frequency Provider Last Rate Last Admin  ? 0.9 %  sodium chloride infusion  500 mL Intravenous Once  Daryel November, MD      ? ? ?Review of Systems  ?Constitutional:  Negative for chills, fever, malaise/fatigue and weight loss.  ?HENT:  Negative for hearing loss, sore throat and tinnitus.   ?Eyes:  Nega

## 2022-03-14 NOTE — Patient Instructions (Signed)
Thank you for visiting Dr. Valeta Harms at [facility]. ?Today we recommend the following: ? ?Orders Placed This Encounter  ?Procedures  ? NM PET Image Initial (PI) Skull Base To Thigh (F-18 FDG)  ? ?See me after PET scan complete to discuss next steps.  ?Likely plans for bronchoscopy after PET is complete and we will talk about it on next visit.  ? ?Return in about 3 weeks (around 04/04/2022) for w/ Dr. Valeta Harms. ? ? ? ?Please do your part to reduce the spread of COVID-19.  ? ?

## 2022-03-31 ENCOUNTER — Ambulatory Visit (HOSPITAL_COMMUNITY): Payer: Medicare Other

## 2022-04-11 ENCOUNTER — Telehealth: Payer: Self-pay | Admitting: Pulmonary Disease

## 2022-04-11 NOTE — Telephone Encounter (Signed)
I called pt & made her aware we got it authorized but we don't know how much they will pay - I offered to give her CPT code so she can call her insurance to see how they will cover.  She states that is ok - she didn't think we would know - she got a recording from her insurance that told her to call her provider.  Nothing further needed.  ?

## 2022-04-20 ENCOUNTER — Ambulatory Visit (HOSPITAL_COMMUNITY)
Admission: RE | Admit: 2022-04-20 | Discharge: 2022-04-20 | Disposition: A | Discharge status: Home / Self Care | Payer: Medicare Other | Source: Ambulatory Visit | Attending: Pulmonary Disease | Admitting: Pulmonary Disease

## 2022-04-20 DIAGNOSIS — R918 Other nonspecific abnormal finding of lung field: Secondary | ICD-10-CM | POA: Insufficient documentation

## 2022-04-20 DIAGNOSIS — C3490 Malignant neoplasm of unspecified part of unspecified bronchus or lung: Secondary | ICD-10-CM | POA: Diagnosis not present

## 2022-04-20 LAB — GLUCOSE, CAPILLARY: Glucose-Capillary: 97 mg/dL (ref 70–99)

## 2022-04-20 MED ORDER — FLUDEOXYGLUCOSE F - 18 (FDG) INJECTION
5.9000 | Freq: Once | INTRAVENOUS | Status: AC
  Administered 2022-04-20: 5.9 via INTRAVENOUS

## 2022-04-26 ENCOUNTER — Other Ambulatory Visit: Payer: Self-pay | Admitting: Family Medicine

## 2022-04-26 NOTE — Telephone Encounter (Signed)
Received fax from [facility] requesting a refill on diazepam (VALIUM) 10 MG tablet [[id number]]  ?  Order Details ?Dose: 10 mg Route: Oral Frequency: Every 12 hours PRN for anxiety  ?Dispense Quantity: 30 tablet Refills: 0   ?     ?Sig: Take 1 tablet (10 mg total) by mouth every 12 (twelve) hours as needed for anxiety.  ?     ?Start Date: 03/03/22 End Date: --  ?Written Date: 03/03/22 Expiration Date: 08/30/22  ?Original Order:  diazepam (VALIUM) 10 MG tablet [[id number]]  ?Providers ? ?Authorizing Provider:   ?Susy Frizzle, MD  ?[address] [place], [place] [address]  ?Phone:  [phone]   Fax:  [phone]  ?DEA #:  [id number]   NPI:  [id number]   ?   ?Ordering User:  Susy Frizzle, MD   ?   ? ?Pharmacy ? ?[facility] [id number] [place], [place]  ?[address] [place], [place] [address]   ? ?

## 2022-04-27 ENCOUNTER — Encounter: Payer: Self-pay | Admitting: Pulmonary Disease

## 2022-04-27 ENCOUNTER — Ambulatory Visit: Payer: Medicare Other | Admitting: Pulmonary Disease

## 2022-04-27 VITALS — BP 106/64 | HR 60 | Temp 98.2°F | Ht 61.0 in | Wt 122.2 lb

## 2022-04-27 DIAGNOSIS — Z853 Personal history of malignant neoplasm of breast: Secondary | ICD-10-CM

## 2022-04-27 DIAGNOSIS — I2584 Coronary atherosclerosis due to calcified coronary lesion: Secondary | ICD-10-CM | POA: Diagnosis not present

## 2022-04-27 DIAGNOSIS — R918 Other nonspecific abnormal finding of lung field: Secondary | ICD-10-CM

## 2022-04-27 DIAGNOSIS — R599 Enlarged lymph nodes, unspecified: Secondary | ICD-10-CM | POA: Insufficient documentation

## 2022-04-27 DIAGNOSIS — I2510 Atherosclerotic heart disease of native coronary artery without angina pectoris: Secondary | ICD-10-CM

## 2022-04-27 DIAGNOSIS — Z720 Tobacco use: Secondary | ICD-10-CM | POA: Diagnosis not present

## 2022-04-27 NOTE — Patient Instructions (Signed)
Thank you for visiting Dr. Valeta Harms at [facility]. ?Today we recommend the following: ? ?Orders Placed This Encounter  ?Procedures  ? Procedural/ Surgical Case Request: VIDEO BRONCHOSCOPY WITH ENDOBRONCHIAL ULTRASOUND  ? Ambulatory referral to Cardiology  ? Ambulatory referral to Pulmonology  ? ?Bronchosocpy on 05/10/2022 ? ?Return in about 20 days (around 05/17/2022) for with Eric Form, NP. ? ? ? ?Please do your part to reduce the spread of COVID-19.  ? ?

## 2022-04-27 NOTE — Progress Notes (Signed)
? ?Synopsis: Referred in multiple lung nodules, march 2023 by Susy Frizzle, MD ? ?Subjective:  ? ?PATIENT ID: Monique [name] GENDER: female DOB: 11-Sep-1956, MRN: [id number] ? ?Chief Complaint  ?Patient presents with  ? Follow-up  ?  Patient is here to follow up on CT  ? ? ?This is a 66 year old female, past medical history of left breast cancer in 2018 status post mastectomy followed by antiestrogen oral therapy.  She was only able to tolerate for this for short period and so she stopped.She also has been a longtime smoker.  She was enrolled in lung cancer screening CT.  She had this completed a few weeks ago in February.  This revealed a possible obstruction in the anterior segment of the right upper lobe with associated postobstructive atelectasis.  Additionally there was other small nodules in the lung.  She attempted to get a contrasted CT but they were unable to get the IV for contrast and this leaked into the arm.  So she was rescheduled.  She was referred here for evaluation of bilateral pulmonary nodules and abnormal lung cancer screening CT. ? ?OV 04/27/2022: Here today for follow-up after recent nuclear medicine pet imaging.  This shows a hypermetabolic uptake within something ranging off of the right hilum.  There is a SUV max of 6.  Concern for underlying malignancy.  Decision made today for bronchoscopy.  We reviewed all of her PET scan images today in the office. ? ? ?Oncology History  ?Malignant neoplasm of upper-outer quadrant of left breast in female, estrogen receptor positive ([place])  ?05/09/2017 Mammogram  ? Diagnostic Mammogram 05/09/17 ?IMPRESSION: ?1. 2.9 x 2.8 x 1.8 cm mass with imaging features highly suspicious ?for malignancy in the 12:30 o'clock position of the left breast 2 cm from the nipple. ?2. Left axillary lymph node measuring up to 4.2 mm in maximum thicknes with mild, lobulated and slightly ?irregular cortical thickening. This is suspicious for a metastatic ?node. ? ? ?   ?05/12/2017 Initial Diagnosis  ? Palpable left breast mass with nipple inversion, mammogram revealed mass at 12:30 position 2.9 cm with suspicious lymph node in axilla. Biopsy revealed IDC grade 2, ER 100%, PR 95%, Ki-67 15%, HER-2 negative ratio 1.68; lymph node biopsy benign, T2 N0 stage II a clinical stage ? ?  ?05/13/2017 Oncotype testing  ? 5/5%, low risk ? ?  ?06/05/2017 Genetic Testing  ? 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  The Hereditary Gene Panel offered by [facility] includes sequencing and/or deletion duplication testing of the following 46 genes: APC, ATM, AXIN2, BARD1, BMPR1A, BRCA1, BRCA2, BRIP1, CDH1, CDKN2A (p14ARF), CDKN2A (p16INK4a), CHEK2, CTNNA1, DICER1, EPCAM (Deletion/duplication testing only), GREM1 (promoter region deletion/duplication testing only), KIT, MEN1, MLH1, MSH2, MSH3, MSH6, MUTYH, NBN, NF1, NHTL1, PALB2, PDGFRA, PMS2, POLD1, POLE, PTEN, RAD50, RAD51C, RAD51D, SDHB, SDHC, SDHD, SMAD4, SMARCA4. STK11, TP53, TSC1, TSC2, and VHL.  The following genes were evaluated for sequence changes only: SDHA and HOXB13 c.251G>A variant only.  The report date is June 05, 2017. ? ? ?  ?08/03/2017 Surgery  ? Lt Mastectomy: IDC grade 2; 3.2 cm, margins Neg, ER 100%, PR 95%, Ki 67 15% Her 2 Neg Ratio 1.68; 0/2 LN Neg, T2N0 Stage 1A ? ?  ?12/06/2017 -  Anti-estrogen oral therapy  ? Tamoxifen 20 mg once daily to start 12/06/17 for 5 years.  ? ?-She was previously on Anastroole for 3 weeks in 05/2017. Stopped due to poor toleration from joint pain.  ? ?  ? ? ? ?  Past Medical History:  ?Diagnosis Date  ? Abnormal EKG   ? Anxiety   ? [employer] 11/29/2020  ? Arthritis   ? [employer] 11/29/2020  ? Barrett's esophagus   ? Breast cancer ([place]) 2018  ? Left breast  ? Cancer ([place])   ? [employer] 11/29/2020  ? Complication of anesthesia   ? Depression   ? Family history of breast cancer   ? Family history of prostate cancer   ? Family history of thyroid cancer   ? Fatigue   ?  Generalized headaches   ? IBS (irritable bowel syndrome)   ? Meniere disease   ? Osteopenia   ? PONV (postoperative nausea and vomiting)   ? Psoriasis   ? Tinnitus   ? left ear  ?  ? ?Family History  ?Problem Relation Age of Onset  ? Hypertension Mother   ? Heart attack Father   ? Hypertension Father   ? Healthy Maternal Aunt   ? Healthy Maternal Uncle   ? Breast cancer Paternal Aunt 85  ?     bilateral  ? Healthy Paternal Aunt   ? Prostate cancer Paternal Uncle   ? Thyroid cancer Paternal Uncle   ? Healthy Paternal Uncle   ? Breast cancer Cousin 72  ?     paternal first cousin  ? Breast cancer Cousin   ?     paternal first cousin  ? Colon cancer Neg Hx   ? Esophageal cancer Neg Hx   ? Rectal cancer Neg Hx   ? Stomach cancer Neg Hx   ?  ? ?Past Surgical History:  ?Procedure Laterality Date  ? BREAST SURGERY N/A   ? [employer] 11/29/2020  ? DILATION AND CURETTAGE OF UTERUS    ? MASTECTOMY Left 2018  ? MASTECTOMY W/ SENTINEL NODE BIOPSY Left 08/03/2017  ? Procedure: LEFT MASTECTOMY WITH LEFT AXILLARY SENTINEL LYMPH NODE BIOPSY;  Surgeon: Alphonsa Overall, MD;  Location: [facility];  Service: General;  Laterality: Left;  ? skin cancer removal    ? TONSILLECTOMY    ? ? ?Social History  ? ?Socioeconomic History  ? Marital status: Married  ?  Spouse name: Not on file  ? Number of children: Not on file  ? Years of education: Not on file  ? Highest education level: Not on file  ?Occupational History  ? Not on file  ?Tobacco Use  ? Smoking status: Former  ?  Packs/day: 0.50  ?  Types: Cigarettes  ?  Quit date: 2018  ?  Years since quitting: 5.3  ?  Passive exposure: Yes  ? Smokeless tobacco: Never  ?Vaping Use  ? Vaping Use: Never used  ?Substance and Sexual Activity  ? Alcohol use: Yes  ?  Comment: occas  ? Drug use: Never  ? Sexual activity: Not Currently  ?  Birth control/protection: Post-menopausal  ?Other Topics Concern  ? Not on file  ?Social History Narrative  ? Not on file  ? ?Social Determinants of Health   ? ?Financial Resource Strain: Low Risk   ? Difficulty of Paying Living Expenses: Not very hard  ?Food Insecurity: Food Insecurity Present  ? Worried About Charity fundraiser in the Last Year: Sometimes true  ? Ran Out of Food in the Last Year: Never true  ?Transportation Needs: No Transportation Needs  ? Lack of Transportation (Medical): No  ? Lack of Transportation (Non-Medical): No  ?Physical Activity: Sufficiently Active  ? Days of Exercise per Week: 5 days  ?  Minutes of Exercise per Session: 30 min  ?Stress: Stress Concern Present  ? Feeling of Stress : Rather much  ?Social Connections: Moderately Isolated  ? Frequency of Communication with Friends and Family: More than three times a week  ? Frequency of Social Gatherings with Friends and Family: Once a week  ? Attends Religious Services: Never  ? Active Member of Clubs or Organizations: No  ? Attends Archivist Meetings: Never  ? Marital Status: Married  ?Intimate Partner Violence: Not At Risk  ? Fear of Current or Ex-Partner: No  ? Emotionally Abused: No  ? Physically Abused: No  ? Sexually Abused: No  ?  ? ?Allergies  ?Allergen Reactions  ? Bupropion Other (See Comments)  ?  Hallucinate ?Other reaction(s): hallucination  ? Chantix [Varenicline]   ?  Other reaction(s): hallucination  ? Codeine Nausea And Vomiting  ?  ? ?Outpatient Medications Prior to Visit  ?Medication Sig Dispense Refill  ? CRANBERRY PO Take by mouth.    ? diazepam (VALIUM) 10 MG tablet Take 1 tablet (10 mg total) by mouth every 12 (twelve) hours as needed for anxiety. 30 tablet 0  ? APPLE CIDER VINEGAR PO Take by mouth. (Patient not taking: Reported on 04/27/2022)    ? calcium carbonate (OS-CAL - DOSED IN MG OF ELEMENTAL CALCIUM) 1250 (500 Ca) MG tablet Take 1,200 tablets by mouth. (Patient not taking: Reported on 04/27/2022)    ? HYDROcodone-acetaminophen (NORCO) 5-325 MG tablet Take 1 tablet by mouth every 6 (six) hours as needed for moderate pain. (Patient not taking: Reported  on 04/27/2022) 30 tablet 0  ? hydrocortisone (ANUSOL-HC) 25 MG suppository Place 1 suppository (25 mg total) rectally 2 (two) times daily. (Patient not taking: Reported on 04/27/2022) 12 suppository 0  ? Multipl

## 2022-04-27 NOTE — Telephone Encounter (Signed)
LOV 12/02/21 ?Last refill 03/03/22, #30, 0 refills ? ?Please review, thanks! ? ?

## 2022-04-27 NOTE — Telephone Encounter (Signed)
Requested medication (s) are due for refill today: yes ? ?Requested medication (s) are on the active medication list: yes ? ?Last refill:  03/03/22 #30/0 ? ?Future visit scheduled: yes ? ?Notes to clinic:  Unable to refill per protocol, cannot delegate. ? ?  ?Requested Prescriptions  ?Pending Prescriptions Disp Refills  ? diazepam (VALIUM) 10 MG tablet 30 tablet 0  ?  Sig: Take 1 tablet (10 mg total) by mouth every 12 (twelve) hours as needed for anxiety.  ?  ? Not Delegated - Psychiatry: Anxiolytics/Hypnotics 2 Failed - 04/26/2022 10:15 AM  ?  ?  Failed - This refill cannot be delegated  ?  ?  Failed - Urine Drug Screen completed in last 360 days  ?  ?  Failed - Valid encounter within last 6 months  ?  Recent Outpatient Visits   ? ?      ? 4 months ago General medical exam  ? [facility] Susy Frizzle, MD  ? 1 year ago Dysuria  ? [facility], Cammie Mcgee, MD  ? 1 year ago General medical exam  ? [facility] Susy Frizzle, MD  ? 1 year ago Chest pain at rest  ? [facility], Cammie Mcgee, MD  ? 2 years ago General medical exam  ? [facility] Susy Frizzle, MD  ? ?  ?  ? ? ?  ?  ?  Passed - Patient is not pregnant  ?  ?  ? ?

## 2022-04-27 NOTE — H&P (View-Only) (Signed)
Synopsis: Referred in multiple lung nodules, march 2023 by Susy Frizzle, MD  Subjective:   PATIENT ID: Monique [name] GENDER: female DOB: 1956-12-19, MRN: [id number]  Chief Complaint  Patient presents with   Follow-up    Patient is here to follow up on CT    This is a 66 year old female, past medical history of left breast cancer in 2018 status post mastectomy followed by antiestrogen oral therapy.  She was only able to tolerate for this for short period and so she stopped.She also has been a longtime smoker.  She was enrolled in lung cancer screening CT.  She had this completed a few weeks ago in February.  This revealed a possible obstruction in the anterior segment of the right upper lobe with associated postobstructive atelectasis.  Additionally there was other small nodules in the lung.  She attempted to get a contrasted CT but they were unable to get the IV for contrast and this leaked into the arm.  So she was rescheduled.  She was referred here for evaluation of bilateral pulmonary nodules and abnormal lung cancer screening CT.  OV 04/27/2022: Here today for follow-up after recent nuclear medicine pet imaging.  This shows a hypermetabolic uptake within something ranging off of the right hilum.  There is a SUV max of 6.  Concern for underlying malignancy.  Decision made today for bronchoscopy.  We reviewed all of her PET scan images today in the office.   Oncology History  Malignant neoplasm of upper-outer quadrant of left breast in female, estrogen receptor positive ([place])  05/09/2017 Mammogram   Diagnostic Mammogram 05/09/17 IMPRESSION: 1. 2.9 x 2.8 x 1.8 cm mass with imaging features highly suspicious for malignancy in the 12:30 o'clock position of the left breast 2 cm from the nipple. 2. Left axillary lymph node measuring up to 4.2 mm in maximum thicknes with mild, lobulated and slightly irregular cortical thickening. This is suspicious for a metastatic node.      05/12/2017 Initial Diagnosis   Palpable left breast mass with nipple inversion, mammogram revealed mass at 12:30 position 2.9 cm with suspicious lymph node in axilla. 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  The Hereditary Gene Panel offered by [facility] includes sequencing and/or deletion duplication testing of the following 46 genes: APC, ATM, AXIN2, BARD1, BMPR1A, BRCA1, BRCA2, BRIP1, CDH1, CDKN2A (p14ARF), CDKN2A (p16INK4a), CHEK2, CTNNA1, DICER1, EPCAM (Deletion/duplication testing only), GREM1 (promoter region deletion/duplication testing only), KIT, MEN1, MLH1, MSH2, MSH3, MSH6, MUTYH, NBN, NF1, NHTL1, PALB2, PDGFRA, PMS2, POLD1, POLE, PTEN, RAD50, RAD51C, RAD51D, SDHB, SDHC, SDHD, SMAD4, SMARCA4. STK11, TP53, TSC1, TSC2, and VHL.  The following genes were evaluated for sequence changes only: SDHA and HOXB13 c.251G>A variant only.  The report date is June 05, 2017.     08/03/2017 Surgery   Lt Mastectomy: IDC grade 2; 3.2 cm, margins Neg, ER 100%, PR 95%, Ki 67 15% Her 2 Neg Ratio 1.68; 0/2 LN Neg, T2N0 Stage 1A    12/06/2017 -  Anti-estrogen oral therapy   Tamoxifen 20 mg once daily to start 12/06/17 for 5 years.   -She was previously on Anastroole for 3 weeks in 05/2017. Stopped due to poor toleration from joint pain.  Past Medical History:  Diagnosis Date   Abnormal EKG    Anxiety    Phreesia 11/29/2020   Arthritis    Phreesia 11/29/2020   Barrett's esophagus    Breast cancer ([place]) 2018   Left breast   Cancer ([place])    Phreesia [id number]/0177   Complication of anesthesia    Depression    Family history of breast cancer    Family history of prostate cancer    Family history of thyroid cancer    Fatigue     Generalized headaches    IBS (irritable bowel syndrome)    Meniere disease    Osteopenia    PONV (postoperative nausea and vomiting)    Psoriasis    Tinnitus    left ear     Family History  Problem Relation Age of Onset   Hypertension Mother    Heart attack Father    Hypertension Father    Healthy Maternal Aunt    Healthy Maternal Uncle    Breast cancer Paternal Aunt 72       bilateral   Healthy Paternal Aunt    Prostate cancer Paternal Uncle    Thyroid cancer Paternal Uncle    Healthy Paternal Uncle    Breast cancer Cousin 56       paternal first cousin   Breast cancer Cousin        paternal first cousin   Colon cancer Neg Hx    Esophageal cancer Neg Hx    Rectal cancer Neg Hx    Stomach cancer Neg Hx      Past Surgical History:  Procedure Laterality Date   BREAST SURGERY N/A    [employer]reesia 11/29/2020   DILATION AND CURETTAGE OF UTERUS     MASTECTOMY Left 2018   MASTECTOMY W/ SENTINEL NODE BIOPSY Left 08/03/2017   Procedure: LEFT MASTECTOMY WITH LEFT AXILLARY SENTINEL LYMPH NODE BIOPSY;  Surgeon: Alphonsa Overall, MD;  Location: [facility];  Service: General;  Laterality: Left;   skin cancer removal     TONSILLECTOMY      Social History   Socioeconomic History   Marital status: Married    Spouse name: Not on file   Number of children: Not on file   Years of education: Not on file   Highest education level: Not on file  Occupational History   Not on file  Tobacco Use   Smoking status: Former    Packs/day: 0.50    Types: Cigarettes    Quit date: 2018    Years since quitting: 5.3    Passive exposure: Yes   Smokeless tobacco: Never  Vaping Use   Vaping Use: Never used  Substance and Sexual Activity   Alcohol use: Yes    Comment: occas   Drug use: Never   Sexual activity: Not Currently    Birth control/protection: Post-menopausal  Other Topics Concern   Not on file  Social History Narrative   Not on file   Social Determinants of Health    Financial Resource Strain: Low Risk    Difficulty of Paying Living Expenses: Not very hard  Food Insecurity: Food Insecurity Present   Worried About Running Out of Food in the Last Year: Sometimes true   Ran Out of Food in the Last Year: Never true  Transportation Needs: No Transportation Needs   Lack of Transportation (Medical): No   Lack of Transportation (Non-Medical): No  Physical Activity: Sufficiently Active   Days of Exercise per Week: 5 days  Minutes of Exercise per Session: 30 min  Stress: Stress Concern Present   Feeling of Stress : Rather much  Social Connections: Moderately Isolated   Frequency of Communication with Friends and Family: More than three times a week   Frequency of Social Gatherings with Friends and Family: Once a week   Attends Religious Services: Never   Marine scientist or Organizations: No   Attends Music therapist: Never   Marital Status: Married  Human resources officer Violence: Not At Risk   Fear of Current or Ex-Partner: No   Emotionally Abused: No   Physically Abused: No   Sexually Abused: No     Allergies  Allergen Reactions   Bupropion Other (See Comments)    Hallucinate Other reaction(s): hallucination   Chantix [Varenicline]     Other reaction(s): hallucination   Codeine Nausea And Vomiting     Outpatient Medications Prior to Visit  Medication Sig Dispense Refill   CRANBERRY PO Take by mouth.     diazepam (VALIUM) 10 MG tablet Take 1 tablet (10 mg total) by mouth every 12 (twelve) hours as needed for anxiety. 30 tablet 0   APPLE CIDER VINEGAR PO Take by mouth. (Patient not taking: Reported on 04/27/2022)     calcium carbonate (OS-CAL - DOSED IN MG OF ELEMENTAL CALCIUM) 1250 (500 Ca) MG tablet Take 1,200 tablets by mouth. (Patient not taking: Reported on 04/27/2022)     HYDROcodone-acetaminophen (NORCO) 5-325 MG tablet Take 1 tablet by mouth every 6 (six) hours as needed for moderate pain. (Patient not taking: Reported  on 04/27/2022) 30 tablet 0   hydrocortisone (ANUSOL-HC) 25 MG suppository Place 1 suppository (25 mg total) rectally 2 (two) times daily. (Patient not taking: Reported on 04/27/2022) 12 suppository 0   Multiple Vitamins-Minerals (AIRBORNE PO) Take by mouth. (Patient not taking: Reported on 04/27/2022)     Facility-Administered Medications Prior to Visit  Medication Dose Route Frequency Provider Last Rate Last Admin   0.9 %  sodium chloride infusion  500 mL Intravenous Once Daryel November, MD        Review of Systems  Constitutional:  Negative for chills, fever, malaise/fatigue and weight loss.  HENT:  Negative for hearing loss, sore throat and tinnitus.   Eyes:  Negative for blurred vision and double vision.  Respiratory:  Positive for shortness of breath. Negative for cough, hemoptysis, sputum production, wheezing and stridor.   Cardiovascular:  Negative for chest pain, palpitations, orthopnea, leg swelling and PND.  Gastrointestinal:  Negative for abdominal pain, constipation, diarrhea, heartburn, nausea and vomiting.  Genitourinary:  Negative for dysuria, hematuria and urgency.  Musculoskeletal:  Negative for joint pain and myalgias.  Skin:  Negative for itching and rash.  Neurological:  Negative for dizziness, tingling, weakness and headaches.  Endo/Heme/Allergies:  Negative for environmental allergies. Does not bruise/bleed easily.  Psychiatric/Behavioral:  Negative for depression. The patient is not nervous/anxious and does not have insomnia.   All other systems reviewed and are negative.   Objective:  Physical Exam Vitals reviewed.  Constitutional:      General: She is not in acute distress.    Appearance: She is well-developed.  HENT:     Head: Normocephalic and atraumatic.  Eyes:     General: No scleral icterus.    Conjunctiva/sclera: Conjunctivae normal.     Pupils: Pupils are equal, round, and reactive to light.  Neck:     Vascular: No JVD.     Trachea: No tracheal  deviation.  Cardiovascular:     Rate and Rhythm: Normal rate and regular rhythm.     Heart sounds: Normal heart sounds. No murmur heard. Pulmonary:     Effort: Pulmonary effort is normal. No tachypnea, accessory muscle usage or respiratory distress.     Breath sounds: Normal breath sounds. No stridor. No wheezing, rhonchi or rales.  Abdominal:     Palpations: Abdomen is soft.  Musculoskeletal:        General: No tenderness.     Cervical back: Neck supple.  Lymphadenopathy:     Cervical: No cervical adenopathy.  Skin:    General: Skin is warm and dry.     Capillary Refill: Capillary refill takes less than 2 seconds.     Findings: No rash.  Neurological:     Mental Status: She is alert and oriented to person, place, and time.  Psychiatric:        Behavior: Behavior normal.     Vitals:   04/27/22 0949  BP: 106/64  Pulse: 60  Temp: 98.2 F (36.8 C)  TempSrc: Oral  SpO2: 99%  Weight: 122 lb 3.2 oz (55.4 kg)  Height: 5' 1"  (1.549 m)   99% on RA BMI Readings from Last 3 Encounters:  04/27/22 23.09 kg/m  03/14/22 23.87 kg/m  01/11/22 22.78 kg/m   Wt Readings from Last 3 Encounters:  04/27/22 122 lb 3.2 oz (55.4 kg)  03/14/22 122 lb 3.2 oz (55.4 kg)  01/11/22 118 lb 9.6 oz (53.8 kg)     CBC    Component Value Date/Time   WBC 6.3 12/02/2021 0859   RBC 4.60 12/02/2021 0859   HGB 13.2 12/02/2021 0859   HGB 14.1 05/24/2017 0825   HCT 40.1 12/02/2021 0859   HCT 41.6 05/24/2017 0825   PLT 323 12/02/2021 0859   PLT 272 05/24/2017 0825   MCV 87.2 12/02/2021 0859   MCV 87.5 05/24/2017 0825   MCH 28.7 12/02/2021 0859   MCHC 32.9 12/02/2021 0859   RDW 12.0 12/02/2021 0859   RDW 13.1 05/24/2017 0825   LYMPHSABS 2,451 12/02/2021 0859   LYMPHSABS 2.6 05/24/2017 0825   MONOABS 0.4 05/24/2017 0825   EOSABS 391 12/02/2021 0859   EOSABS 0.3 05/24/2017 0825   BASOSABS 50 12/02/2021 0859   BASOSABS 0.1 05/24/2017 0825     Chest Imaging: February 2023 lung cancer  screening CT: Patient was found to have a fullness in the right upper perihilar region with obstruction of the anterior segment of the right upper lobe with atelectasis.  Additionally has other small pulmonary nodules largest at 12 mm in the right lower lobe. The patient's images have been independently reviewed by me.    Pulmonary Functions Testing Results:     View : No data to display.          FeNO:   Pathology:   Echocardiogram:   Heart Catheterization:     Assessment & Plan:     ICD-10-CM   1. Coronary artery calcification  I25.10 Ambulatory referral to Cardiology   I25.84     2. Adenopathy  R59.9 Procedural/ Surgical Case Request: VIDEO BRONCHOSCOPY WITH ENDOBRONCHIAL ULTRASOUND    Ambulatory referral to Pulmonology    3. History of breast cancer  Z85.3     4. Multiple lung nodules  R91.8     5. Tobacco abuse  Z72.0       Discussion:  This is a 66 year old female, multiple pulmonary nodules, abnormal lung cancer screening CT, history of breast cancer in  2008 was only on antiestrogen therapy for a few weeks that she did not tolerate.  She did have surgery with mastectomy on the left side.  Reviewed her nuclear medicine pet imaging that was completed recently that shows hypermetabolic uptake within the right hilum.  This likely is potential nodal involvement of malignancy.  Plan: Discussed risk benefits and alternatives of proceeding with bronchoscopy. Patient needs video bronchoscopy endobronchial ultrasound. We will also need to evaluate that internal can portion of the airway which may have endobronchial tumor present. She has some postobstructive atelectasis so I do suspect there is at least some extrinsic compression of the airway. We will get patient set up for bronchoscopy ASAP. She is unable to do Mondays or Fridays. Next available day for bronchoscopy will be 05/10/2022. Orders have been placed We appreciate PCC's help with scheduling.    Current  Outpatient Medications:    CRANBERRY PO, Take by mouth., Disp: , Rfl:    diazepam (VALIUM) 10 MG tablet, Take 1 tablet (10 mg total) by mouth every 12 (twelve) hours as needed for anxiety., Disp: 30 tablet, Rfl: 0   APPLE CIDER VINEGAR PO, Take by mouth. (Patient not taking: Reported on 04/27/2022), Disp: , Rfl:    calcium carbonate (OS-CAL - DOSED IN MG OF ELEMENTAL CALCIUM) 1250 (500 Ca) MG tablet, Take 1,200 tablets by mouth. (Patient not taking: Reported on 04/27/2022), Disp: , Rfl:    HYDROcodone-acetaminophen (NORCO) 5-325 MG tablet, Take 1 tablet by mouth every 6 (six) hours as needed for moderate pain. (Patient not taking: Reported on 04/27/2022), Disp: 30 tablet, Rfl: 0   hydrocortisone (ANUSOL-HC) 25 MG suppository, Place 1 suppository (25 mg total) rectally 2 (two) times daily. (Patient not taking: Reported on 04/27/2022), Disp: 12 suppository, Rfl: 0   Multiple Vitamins-Minerals (AIRBORNE PO), Take by mouth. (Patient not taking: Reported on 04/27/2022), Disp: , Rfl:   Current Facility-Administered Medications:    0.9 %  sodium chloride infusion, 500 mL, Intravenous, Once, Candis Schatz, Gladstone Pih, MD  I spent 42 minutes dedicated to the care of this patient on the date of this encounter to include pre-visit review of records, face-to-face time with the patient discussing conditions above, post visit ordering of testing, clinical documentation with the electronic health record, making appropriate referrals as documented, and communicating necessary findings to members of the patients care team.     Garner Nash, [facility] 04/27/2022 10:21 AM

## 2022-04-28 MED ORDER — DIAZEPAM 10 MG PO TABS: 10.0000 mg | ORAL_TABLET | Freq: Two times a day (BID) | ORAL | 0 refills | Status: DC | PRN

## 2022-04-29 ENCOUNTER — Other Ambulatory Visit: Payer: Self-pay | Admitting: Family Medicine

## 2022-04-29 NOTE — Telephone Encounter (Signed)
Received a call from patient she states that [name] said that they didn't receive a fax. I also got another request via fax from them asking for a refill on her diazepam 10mg  tablet. ?

## 2022-05-02 NOTE — Telephone Encounter (Signed)
Refill denied, requested too soon.  ?

## 2022-05-02 NOTE — Telephone Encounter (Signed)
Requested medication (s) are due for refill today: no ? ?Requested medication (s) are on the active medication list: yes ? ?Last refill:  04/28/22 #30 ? ?Future visit scheduled: yes ? ?Notes to clinic:  NT not delegated to refuse this med ? ? ?Requested Prescriptions  ?Pending Prescriptions Disp Refills  ? diazepam (VALIUM) 10 MG tablet 30 tablet 0  ?  Sig: Take 1 tablet (10 mg total) by mouth every 12 (twelve) hours as needed for anxiety.  ?  ? Not Delegated - Psychiatry: Anxiolytics/Hypnotics 2 Failed - 04/29/2022  3:42 PM  ?  ?  Failed - This refill cannot be delegated  ?  ?  Failed - Urine Drug Screen completed in last 360 days  ?  ?  Failed - Valid encounter within last 6 months  ?  Recent Outpatient Visits   ? ?      ? 5 months ago General medical exam  ? [facility] Susy Frizzle, MD  ? 1 year ago Dysuria  ? [facility] Pickard, Cammie Mcgee, MD  ? 1 year ago General medical exam  ? [facility] Susy Frizzle, MD  ? 1 year ago Chest pain at rest  ? [facility], Cammie Mcgee, MD  ? 2 years ago General medical exam  ? [facility] Susy Frizzle, MD  ? ?  ?  ?Future Appointments   ? ?        ? Tomorrow Susy Frizzle, MD [facility]  ? In 1 month Buford Dresser, MD [facility]  ? ?  ? ? ?  ?  ?  Passed - Patient is not pregnant  ?  ?  ? ? ? ? ?

## 2022-05-03 ENCOUNTER — Ambulatory Visit (INDEPENDENT_AMBULATORY_CARE_PROVIDER_SITE_OTHER): Payer: Medicare Other | Admitting: Family Medicine

## 2022-05-03 ENCOUNTER — Other Ambulatory Visit: Payer: Self-pay

## 2022-05-03 VITALS — BP 112/58 | HR 56 | Temp 97.8°F | Ht 61.0 in | Wt 121.2 lb

## 2022-05-03 DIAGNOSIS — R399 Unspecified symptoms and signs involving the genitourinary system: Secondary | ICD-10-CM

## 2022-05-03 DIAGNOSIS — R918 Other nonspecific abnormal finding of lung field: Secondary | ICD-10-CM

## 2022-05-03 LAB — URINALYSIS, ROUTINE W REFLEX MICROSCOPIC
Bacteria, UA: NONE SEEN /HPF
Bilirubin Urine: NEGATIVE
Glucose, UA: NEGATIVE
Hyaline Cast: NONE SEEN /LPF
Ketones, ur: NEGATIVE
Leukocytes,Ua: NEGATIVE
Nitrite: NEGATIVE
Protein, ur: NEGATIVE
Specific Gravity, Urine: 1.002 (ref 1.001–1.035)
WBC, UA: NONE SEEN /HPF (ref 0–5)
pH: 6 (ref 5.0–8.0)

## 2022-05-03 LAB — MICROSCOPIC MESSAGE

## 2022-05-03 NOTE — Addendum Note (Signed)
Addended by: Colman Cater on: 05/03/2022 12:44 PM ? ? Modules accepted: Orders ? ?

## 2022-05-03 NOTE — Progress Notes (Signed)
? ?Subjective:  ? ? Patient ID: Monique [name], female    DOB: 04-24-1956, 66 y.o.   MRN: [id number] ? ?HPI ?CT 2/23- ?Soft tissue fullness in the right perihilar region with associated ?obstruction of the anterior segmental right upper lobe bronchus and ?associated volume loss. Multiple additional pulmonary nodules ?measure up to 12.0 mm in the right lower lobe. Further initial ?evaluation with diagnostic CT chest with contrast is recommended ? ?PET 5/23- ?IMPRESSION: ?1. Focal moderate hypermetabolism corresponding to the area of right ?perihilar soft tissue fullness, suspicious for nodal or central ?right upper lobe primary bronchogenic carcinoma. Consider ?bronchoscopy with attention to the soft tissues anterior to the ?central right upper lobe bronchus. ?2. No findings of hypermetabolic extrathoracic disease. Pulmonary ?nodules as detailed on prior lung cancer screening CT, below PET ?resolution. ?3. Incidental findings, including: Aortic atherosclerosis ? ?Patient is here today to discuss these results.  Her pulmonologist has recommended a bronchoscopy and a biopsy.  The patient is concerned about doing this procedure and wants to know if it is really necessary. ?Past Medical History:  ?Diagnosis Date  ? Abnormal EKG   ? Anxiety   ? [employer] 11/29/2020  ? Arthritis   ? [employer] 11/29/2020  ? Barrett's esophagus   ? Breast cancer ([place]) 2018  ? Left breast  ? Cancer ([place])   ? [employer] 11/29/2020  ? Complication of anesthesia   ? Depression   ? Family history of breast cancer   ? Family history of prostate cancer   ? Family history of thyroid cancer   ? Fatigue   ? Generalized headaches   ? IBS (irritable bowel syndrome)   ? Meniere disease   ? Osteopenia   ? PONV (postoperative nausea and vomiting)   ? Psoriasis   ? Tinnitus   ? left ear  ? ?Past Surgical History:  ?Procedure Laterality Date  ? BREAST SURGERY N/A   ? [employer] 11/29/2020  ? DILATION AND CURETTAGE OF UTERUS    ? MASTECTOMY Left 2018  ? MASTECTOMY  W/ SENTINEL NODE BIOPSY Left 08/03/2017  ? Procedure: LEFT MASTECTOMY WITH LEFT AXILLARY SENTINEL LYMPH NODE BIOPSY;  Surgeon: Alphonsa Overall, MD;  Location: [facility];  Service: General;  Laterality: Left;  ? skin cancer removal    ? TONSILLECTOMY    ? ?Current Outpatient Medications on File Prior to Visit  ?Medication Sig Dispense Refill  ? APPLE CIDER VINEGAR PO Take by mouth. (Patient not taking: Reported on 04/27/2022)    ? calcium carbonate (OS-CAL - DOSED IN MG OF ELEMENTAL CALCIUM) 1250 (500 Ca) MG tablet Take 1,200 tablets by mouth. (Patient not taking: Reported on 04/27/2022)    ? CRANBERRY PO Take by mouth.    ? diazepam (VALIUM) 10 MG tablet Take 1 tablet (10 mg total) by mouth every 12 (twelve) hours as needed for anxiety. 30 tablet 0  ? HYDROcodone-acetaminophen (NORCO) 5-325 MG tablet Take 1 tablet by mouth every 6 (six) hours as needed for moderate pain. (Patient not taking: Reported on 04/27/2022) 30 tablet 0  ? hydrocortisone (ANUSOL-HC) 25 MG suppository Place 1 suppository (25 mg total) rectally 2 (two) times daily. (Patient not taking: Reported on 04/27/2022) 12 suppository 0  ? Multiple Vitamins-Minerals (AIRBORNE PO) Take by mouth. (Patient not taking: Reported on 04/27/2022)    ? ?Current Facility-Administered Medications on File Prior to Visit  ?Medication Dose Route Frequency Provider Last Rate Last Admin  ? 0.9 %  sodium chloride infusion  500 mL Intravenous Once  Daryel November, MD      ? ?Allergies  ?Allergen Reactions  ? Bupropion Other (See Comments)  ?  Hallucinate ?Other reaction(s): hallucination  ? Chantix [Varenicline]   ?  Other reaction(s): hallucination  ? Codeine Nausea And Vomiting  ? ?Social History  ? ?Socioeconomic History  ? Marital status: Married  ?  Spouse name: Not on file  ? Number of children: Not on file  ? Years of education: Not on file  ? Highest education level: Not on file  ?Occupational History  ? Not on file  ?Tobacco Use  ? Smoking status:  Former  ?  Packs/day: 0.50  ?  Types: Cigarettes  ?  Quit date: 2018  ?  Years since quitting: 5.3  ?  Passive exposure: Yes  ? Smokeless tobacco: Never  ?Vaping Use  ? Vaping Use: Never used  ?Substance and Sexual Activity  ? Alcohol use: Yes  ?  Comment: occas  ? Drug use: Never  ? Sexual activity: Not Currently  ?  Birth control/protection: Post-menopausal  ?Other Topics Concern  ? Not on file  ?Social History Narrative  ? Not on file  ? ?Social Determinants of Health  ? ?Financial Resource Strain: Low Risk   ? Difficulty of Paying Living Expenses: Not very hard  ?Food Insecurity: Food Insecurity Present  ? Worried About Charity fundraiser in the Last Year: Sometimes true  ? Ran Out of Food in the Last Year: Never true  ?Transportation Needs: No Transportation Needs  ? Lack of Transportation (Medical): No  ? Lack of Transportation (Non-Medical): No  ?Physical Activity: Sufficiently Active  ? Days of Exercise per Week: 5 days  ? Minutes of Exercise per Session: 30 min  ?Stress: Stress Concern Present  ? Feeling of Stress : Rather much  ?Social Connections: Moderately Isolated  ? Frequency of Communication with Friends and Family: More than three times a week  ? Frequency of Social Gatherings with Friends and Family: Once a week  ? Attends Religious Services: Never  ? Active Member of Clubs or Organizations: No  ? Attends Archivist Meetings: Never  ? Marital Status: Married  ?Intimate Partner Violence: Not At Risk  ? Fear of Current or Ex-Partner: No  ? Emotionally Abused: No  ? Physically Abused: No  ? Sexually Abused: No  ? ? ? ? ?Review of Systems  ?All other systems reviewed and are negative. ? ?   ?Objective:  ? Physical Exam ?Vitals reviewed.  ?Constitutional:   ?   General: She is not in acute distress. ?   Appearance: She is well-developed. She is not diaphoretic.  ?HENT:  ?   Head: Normocephalic and atraumatic.  ?   Right Ear: External ear normal.  ?   Left Ear: External ear normal.  ?   Nose:  Nose normal.  ?   Mouth/Throat:  ?   Pharynx: No oropharyngeal exudate.  ?Eyes:  ?   General: No scleral icterus.    ?   Right eye: No discharge.     ?   Left eye: No discharge.  ?   Conjunctiva/sclera: Conjunctivae normal.  ?   Pupils: Pupils are equal, round, and reactive to light.  ?Neck:  ?   Thyroid: No thyromegaly.  ?   Vascular: No JVD.  ?   Trachea: No tracheal deviation.  ?Cardiovascular:  ?   Rate and Rhythm: Regular rhythm.  ?   Heart sounds: Normal heart sounds. No murmur  heard. ?  No friction rub. No gallop.  ?Pulmonary:  ?   Effort: Pulmonary effort is normal. No respiratory distress.  ?   Breath sounds: Normal breath sounds. No stridor. No wheezing or rales.  ?Chest:  ?   Chest wall: No tenderness.  ?Abdominal:  ?   General: Bowel sounds are normal. There is no distension.  ?   Palpations: Abdomen is soft. There is no mass.  ?   Tenderness: There is no abdominal tenderness. There is no guarding or rebound.  ?   Hernia: No hernia is present.  ?Musculoskeletal:     ?   General: No deformity.  ?   Cervical back: Normal range of motion and neck supple.  ?Lymphadenopathy:  ?   Cervical: No cervical adenopathy.  ?Skin: ?   Coloration: Skin is not pale.  ?   Findings: No erythema or rash.  ?Neurological:  ?   Mental Status: She is alert and oriented to person, place, and time.  ?   Cranial Nerves: No cranial nerve deficit.  ?   Motor: No abnormal muscle tone.  ?   Coordination: Coordination normal.  ?   Deep Tendon Reflexes: Reflexes normal.  ?Psychiatric:     ?   Behavior: Behavior normal.     ?   Thought Content: Thought content normal.     ?   Judgment: Judgment normal.  ? ? ? ? ? ?   ?Assessment & Plan:  ?Lung mass ?I explained to the patient how critical the biopsy is.  From the biopsy we can determine if this is metastatic spread from her breast cancer or if is a primary lung cancer.  We can also determine the type of lung cancer whether the small cell, non-small cell, etc.  This will determine  prognosis and treatment options.  Without the biopsy, we are unable to accurately determine the diagnosis cannot accurately suggest treatment options.  Therefore the patient feels comfortable proceeding with a

## 2022-05-06 ENCOUNTER — Other Ambulatory Visit: Payer: Self-pay

## 2022-05-06 ENCOUNTER — Other Ambulatory Visit: Payer: Self-pay | Admitting: Pulmonary Disease

## 2022-05-06 ENCOUNTER — Encounter (HOSPITAL_COMMUNITY): Payer: Self-pay | Admitting: Pulmonary Disease

## 2022-05-06 LAB — SARS CORONAVIRUS 2 (TAT 6-24 HRS): SARS Coronavirus 2: NEGATIVE

## 2022-05-06 NOTE — Progress Notes (Signed)
Monique [name] denies chest pain or shortness. Patient denies having any s/s of Covid in her household.  Patient denies any known exposure to Covid.   PCP is Dr. Jenna Luo.

## 2022-05-10 ENCOUNTER — Encounter (HOSPITAL_COMMUNITY)
Admission: RE | Disposition: A | Discharge status: Home / Self Care | Payer: Self-pay | Source: Ambulatory Visit | Attending: Pulmonary Disease

## 2022-05-10 ENCOUNTER — Ambulatory Visit (HOSPITAL_BASED_OUTPATIENT_CLINIC_OR_DEPARTMENT_OTHER): Payer: Medicare Other | Admitting: Anesthesiology

## 2022-05-10 ENCOUNTER — Encounter (HOSPITAL_COMMUNITY): Payer: Self-pay | Admitting: Pulmonary Disease

## 2022-05-10 ENCOUNTER — Other Ambulatory Visit: Payer: Self-pay

## 2022-05-10 ENCOUNTER — Ambulatory Visit (HOSPITAL_COMMUNITY): Payer: Medicare Other

## 2022-05-10 ENCOUNTER — Ambulatory Visit (HOSPITAL_COMMUNITY)
Admission: RE | Admit: 2022-05-10 | Discharge: 2022-05-10 | Disposition: A | Discharge status: Home / Self Care | Payer: Medicare Other | Source: Ambulatory Visit | Attending: Pulmonary Disease | Admitting: Pulmonary Disease

## 2022-05-10 ENCOUNTER — Ambulatory Visit (HOSPITAL_COMMUNITY): Payer: Medicare Other | Admitting: Anesthesiology

## 2022-05-10 DIAGNOSIS — Z87891 Personal history of nicotine dependence: Secondary | ICD-10-CM | POA: Diagnosis not present

## 2022-05-10 DIAGNOSIS — R918 Other nonspecific abnormal finding of lung field: Secondary | ICD-10-CM | POA: Diagnosis not present

## 2022-05-10 DIAGNOSIS — Z853 Personal history of malignant neoplasm of breast: Secondary | ICD-10-CM | POA: Insufficient documentation

## 2022-05-10 DIAGNOSIS — I2584 Coronary atherosclerosis due to calcified coronary lesion: Secondary | ICD-10-CM | POA: Insufficient documentation

## 2022-05-10 DIAGNOSIS — R599 Enlarged lymph nodes, unspecified: Secondary | ICD-10-CM

## 2022-05-10 DIAGNOSIS — I2510 Atherosclerotic heart disease of native coronary artery without angina pectoris: Secondary | ICD-10-CM | POA: Diagnosis not present

## 2022-05-10 DIAGNOSIS — F419 Anxiety disorder, unspecified: Secondary | ICD-10-CM | POA: Insufficient documentation

## 2022-05-10 DIAGNOSIS — R590 Localized enlarged lymph nodes: Secondary | ICD-10-CM | POA: Diagnosis not present

## 2022-05-10 DIAGNOSIS — Z9012 Acquired absence of left breast and nipple: Secondary | ICD-10-CM | POA: Insufficient documentation

## 2022-05-10 HISTORY — PX: VIDEO BRONCHOSCOPY WITH ENDOBRONCHIAL ULTRASOUND: SHX6177

## 2022-05-10 HISTORY — PX: FINE NEEDLE ASPIRATION: SHX5430

## 2022-05-10 HISTORY — DX: Inflammatory liver disease, unspecified: K75.9

## 2022-05-10 LAB — CBC
HCT: 42.8 % (ref 36.0–46.0)
Hemoglobin: 14 g/dL (ref 12.0–15.0)
MCH: 28.5 pg (ref 26.0–34.0)
MCHC: 32.7 g/dL (ref 30.0–36.0)
MCV: 87.2 fL (ref 80.0–100.0)
Platelets: 325 10*3/uL (ref 150–400)
RBC: 4.91 MIL/uL (ref 3.87–5.11)
RDW: 12.4 % (ref 11.5–15.5)
WBC: 6.2 10*3/uL (ref 4.0–10.5)
nRBC: 0 % (ref 0.0–0.2)

## 2022-05-10 LAB — BASIC METABOLIC PANEL
Anion gap: 9 (ref 5–15)
BUN: 10 mg/dL (ref 8–23)
CO2: 24 mmol/L (ref 22–32)
Calcium: 9.4 mg/dL (ref 8.9–10.3)
Chloride: 105 mmol/L (ref 98–111)
Creatinine, Ser: 0.92 mg/dL (ref 0.44–1.00)
GFR, Estimated: >60 mL/min (ref >60)
Glucose, Bld: 98 mg/dL (ref 70–99)
Potassium: 4.6 mmol/L (ref 3.5–5.1)
Sodium: 138 mmol/L (ref 135–145)

## 2022-05-10 SURGERY — BRONCHOSCOPY, WITH EBUS
Anesthesia: General

## 2022-05-10 MED ORDER — FENTANYL CITRATE (PF) 100 MCG/2ML IJ SOLN
INTRAMUSCULAR | Status: DC | PRN
  Administered 2022-05-10: 50 ug via INTRAVENOUS

## 2022-05-10 MED ORDER — FENTANYL CITRATE (PF) 100 MCG/2ML IJ SOLN: 25.0000 ug | INTRAMUSCULAR | Status: DC | PRN

## 2022-05-10 MED ORDER — MIDAZOLAM HCL 5 MG/5ML IJ SOLN
INTRAMUSCULAR | Status: DC | PRN
  Administered 2022-05-10: 1 mg via INTRAVENOUS

## 2022-05-10 MED ORDER — PHENYLEPHRINE 80 MCG/ML (10ML) SYRINGE FOR IV PUSH (FOR BLOOD PRESSURE SUPPORT)
PREFILLED_SYRINGE | INTRAVENOUS | Status: DC | PRN
  Administered 2022-05-10: 80 ug via INTRAVENOUS
  Administered 2022-05-10 (×3): 160 ug via INTRAVENOUS

## 2022-05-10 MED ORDER — CHLORHEXIDINE GLUCONATE 0.12 % MT SOLN
15.0000 mL | Freq: Once | OROMUCOSAL | Status: AC
  Administered 2022-05-10: 15 mL via OROMUCOSAL
  Filled 2022-05-10 (×2): qty 15

## 2022-05-10 MED ORDER — DEXAMETHASONE SODIUM PHOSPHATE 4 MG/ML IJ SOLN
INTRAMUSCULAR | Status: DC | PRN
Start: 2022-05-10 — End: 2022-05-10
  Administered 2022-05-10: 4 mg via INTRAVENOUS

## 2022-05-10 MED ORDER — ACETAMINOPHEN 500 MG PO TABS
1000.0000 mg | ORAL_TABLET | Freq: Once | ORAL | Status: AC
  Administered 2022-05-10: 1000 mg via ORAL
  Filled 2022-05-10: qty 2

## 2022-05-10 MED ORDER — LACTATED RINGERS IV SOLN: INTRAVENOUS | Status: DC

## 2022-05-10 MED ORDER — ONDANSETRON HCL 4 MG/2ML IJ SOLN
INTRAMUSCULAR | Status: DC | PRN
  Administered 2022-05-10: 4 mg via INTRAVENOUS

## 2022-05-10 MED ORDER — ROCURONIUM BROMIDE 10 MG/ML (PF) SYRINGE
PREFILLED_SYRINGE | INTRAVENOUS | Status: DC | PRN
Start: 2022-05-10 — End: 2022-05-10
  Administered 2022-05-10: 10 mg via INTRAVENOUS
  Administered 2022-05-10: 40 mg via INTRAVENOUS

## 2022-05-10 MED ORDER — SUGAMMADEX SODIUM 200 MG/2ML IV SOLN
INTRAVENOUS | Status: DC | PRN
Start: 2022-05-10 — End: 2022-05-10
  Administered 2022-05-10: 200 mg via INTRAVENOUS

## 2022-05-10 MED ORDER — LIDOCAINE 2% (20 MG/ML) 5 ML SYRINGE
INTRAMUSCULAR | Status: DC | PRN
  Administered 2022-05-10: 60 mg via INTRAVENOUS

## 2022-05-10 MED ORDER — PROPOFOL 500 MG/50ML IV EMUL
INTRAVENOUS | Status: DC | PRN
  Administered 2022-05-10: 150 ug/kg/min via INTRAVENOUS

## 2022-05-10 MED ORDER — PROPOFOL 10 MG/ML IV BOLUS
INTRAVENOUS | Status: DC | PRN
  Administered 2022-05-10: 100 mg via INTRAVENOUS

## 2022-05-10 SURGICAL SUPPLY — 30 items

## 2022-05-10 NOTE — Anesthesia Procedure Notes (Signed)
Procedure Name: Intubation Date/Time: 05/10/2022 10:11 AM Performed by: Lieutenant Diego, CRNA Pre-anesthesia Checklist: Patient identified, Emergency Drugs available, Suction available and Patient being monitored Patient Re-evaluated:Patient Re-evaluated prior to induction Oxygen Delivery Method: Circle system utilized Preoxygenation: Pre-oxygenation with 100% oxygen Induction Type: IV induction Ventilation: Mask ventilation without difficulty Laryngoscope Size: Miller and 2 Grade View: Grade I Tube type: Oral Tube size: 8.5 mm Number of attempts: 2 Airway Equipment and Method: Stylet Placement Confirmation: ETT inserted through vocal cords under direct vision, positive ETCO2 and breath sounds checked- equal and bilateral Secured at: 22 cm Tube secured with: Tape Dental Injury: Teeth and Oropharynx as per pre-operative assessment

## 2022-05-10 NOTE — Anesthesia Preprocedure Evaluation (Addendum)
Anesthesia Evaluation  Patient identified by MRN, date of birth, ID band Patient awake    Reviewed: Allergy & Precautions, H&P , NPO status , Patient's Chart, lab work & pertinent test results  History of Anesthesia Complications (+) PONV and history of anesthetic complications  Airway Mallampati: II  TM Distance: >3 FB Neck ROM: Full    Dental no notable dental hx. (+) Teeth Intact, Dental Advisory Given   Pulmonary neg pulmonary ROS, Patient abstained from smoking., former smoker,    Pulmonary exam normal breath sounds clear to auscultation       Cardiovascular  Rhythm:Regular Rate:Normal     Neuro/Psych  Headaches, Anxiety Depression    GI/Hepatic GERD  ,(+) Hepatitis -  Endo/Other  negative endocrine ROS  Renal/GU negative Renal ROS  negative genitourinary   Musculoskeletal  (+) Arthritis , Osteoarthritis,    Abdominal   Peds  Hematology negative hematology ROS (+)   Anesthesia Other Findings   Reproductive/Obstetrics negative OB ROS                            Anesthesia Physical Anesthesia Plan  ASA: 2  Anesthesia Plan: General   Post-op Pain Management: Tylenol PO (pre-op)* and Minimal or no pain anticipated   Induction: Intravenous  PONV Risk Score and Plan: 4 or greater and Ondansetron, Dexamethasone, Propofol infusion and TIVA  Airway Management Planned: Oral ETT  Additional Equipment:   Intra-op Plan:   Post-operative Plan: Extubation in OR  Informed Consent: I have reviewed the patients History and Physical, chart, labs and discussed the procedure including the risks, benefits and alternatives for the proposed anesthesia with the patient or authorized representative who has indicated his/her understanding and acceptance.     Dental advisory given  Plan Discussed with: CRNA  Anesthesia Plan Comments:         Anesthesia Quick Evaluation

## 2022-05-10 NOTE — Transfer of Care (Signed)
Immediate Anesthesia Transfer of Care Note  Patient: Monique [name]  Procedure(s) Performed: VIDEO BRONCHOSCOPY WITH ENDOBRONCHIAL ULTRASOUND FINE NEEDLE ASPIRATION (FNA) LINEAR  Patient Location: PACU  Anesthesia Type:General  Level of Consciousness: drowsy  Airway & Oxygen Therapy: Patient Spontanous Breathing and Patient connected to face mask oxygen  Post-op Assessment: Report given to RN and Post -op Vital signs reviewed and stable  Post vital signs: Reviewed and stable  Last Vitals:  Vitals Value Taken Time  BP 106/55 05/10/22 1120  Temp 36.4 C 05/10/22 1120  Pulse 70 05/10/22 1127  Resp 19 05/10/22 1127  SpO2 98 % 05/10/22 1127  Vitals shown include unvalidated device data.  Last Pain:  Vitals:   05/10/22 1120  TempSrc:   PainSc: Asleep         Complications: No notable events documented.

## 2022-05-10 NOTE — Discharge Instructions (Signed)
Flexible Bronchoscopy, Care After This sheet gives you information about how to care for yourself after your test. Your doctor may also give you more specific instructions. If you have problems or questions, contact your doctor. Follow these instructions at home: Eating and drinking Do not eat or drink anything (not even water) for 2 hours after your test, or until your numbing medicine (local anesthetic) wears off. When your numbness is gone and your cough and gag reflexes have come back, you may: Eat only soft foods. Slowly drink liquids. The day after the test, go back to your normal diet. Driving Do not drive for 24 hours if you were given a medicine to help you relax (sedative). Do not drive or use heavy machinery while taking prescription pain medicine. General instructions  Take over-the-counter and prescription medicines only as told by your doctor. Return to your normal activities as told. Ask what activities are safe for you. Do not use any products that have nicotine or tobacco in them. This includes cigarettes and e-cigarettes. If you need help quitting, ask your doctor. Keep all follow-up visits as told by your doctor. This is important. It is very important if you had a tissue sample (biopsy) taken. Get help right away if: You have shortness of breath that gets worse. You get light-headed. You feel like you are going to pass out (faint). You have chest pain. You cough up: More than a little blood. More blood than before. Summary Do not eat or drink anything (not even water) for 2 hours after your test, or until your numbing medicine wears off. Do not use cigarettes. Do not use e-cigarettes. Get help right away if you have chest pain.  This information is not intended to replace advice given to you by your health care provider. Make sure you discuss any questions you have with your health care provider. Document Released: 10/02/2009 Document Revised: 11/17/2017 Document  Reviewed: 12/23/2016 Elsevier Patient Education  2020 [employer].

## 2022-05-10 NOTE — Interval H&P Note (Signed)
History and Physical Interval Note:  05/10/2022 8:55 AM  Monique [name]  has presented today for surgery, with the diagnosis of adenopathy.  The various methods of treatment have been discussed with the patient and family. After consideration of risks, benefits and other options for treatment, the patient has consented to  Procedure(s): [place] (N/A) as a surgical intervention.  The patient's history has been reviewed, patient examined, no change in status, stable for surgery.  I have reviewed the patient's chart and labs.  Questions were answered to the patient's satisfaction.     [place]

## 2022-05-10 NOTE — Anesthesia Postprocedure Evaluation (Signed)
Anesthesia Post Note  Patient: Monique [name]  Procedure(s) Performed: VIDEO BRONCHOSCOPY WITH ENDOBRONCHIAL ULTRASOUND FINE NEEDLE ASPIRATION (FNA) LINEAR     Patient location during evaluation: PACU Anesthesia Type: General Level of consciousness: awake and alert Pain management: pain level controlled Vital Signs Assessment: post-procedure vital signs reviewed and stable Respiratory status: spontaneous breathing, nonlabored ventilation and respiratory function stable Cardiovascular status: blood pressure returned to baseline and stable Postop Assessment: no apparent nausea or vomiting Anesthetic complications: no   No notable events documented.  Last Vitals:  Vitals:   05/10/22 1150 05/10/22 1207  BP: 124/73 118/73  Pulse: 65 (!) 58  Resp: 20 16  Temp:  (!) 36.4 C  SpO2: 93% 93%    Last Pain:  Vitals:   05/10/22 1207  TempSrc:   PainSc: 0-No pain                 Eleena Grater,W. EDMOND

## 2022-05-10 NOTE — Op Note (Signed)
Video Bronchoscopy with Endobronchial Ultrasound Procedure Note  Date of Operation: 05/10/2022  Pre-op Diagnosis: Adenopathy  Post-op Diagnosis: Adenopathy  Surgeon: Octavio Graves Latrenda Irani,DO   Assistants: None   Anesthesia: General endotracheal anesthesia  Operation: Flexible video fiberoptic bronchoscopy with endobronchial ultrasound and biopsies.  Estimated Blood Loss: Minimal  Complications: None   Indications and History: SHANTE ARCHAMBEAULT is a 66 y.o. female with adenopathy.  The risks, benefits, complications, treatment options and expected outcomes were discussed with the patient.  The possibilities of pneumothorax, pneumonia, reaction to medication, pulmonary aspiration, perforation of a viscus, bleeding, failure to diagnose a condition and creating a complication requiring transfusion or operation were discussed with the patient who freely signed the consent.    Description of Procedure: The patient was examined in the preoperative area and history and data from the preprocedure consultation were reviewed. It was deemed appropriate to proceed.  The patient was taken to [facility], identified as Jennet Maduro and the procedure verified as Flexible Video Fiberoptic Bronchoscopy.  A Time Out was held and the above information confirmed. After being taken to the operating room general anesthesia was initiated and the patient  was orally intubated. The video fiberoptic bronchoscope was introduced via the endotracheal tube and a general inspection was performed which showed . The standard scope was then withdrawn and the endobronchial ultrasound was used to identify and characterize the peritracheal, hilar and bronchial lymph nodes. Inspection showed a small node along the anterior wall of the right upper lobe as well as enlarged station 7.. Using real-time ultrasound guidance Wang needle biopsies were take from Station 10 R, station 7 nodes and were sent for cytology. The patient tolerated the  procedure well without apparent complications. There was no significant blood loss. The bronchoscope was withdrawn. Anesthesia was reversed and the patient was taken to the PACU for recovery.   Samples: 1. Wang needle biopsies from 10R node 2. Wang needle biopsies from 7 node  Plans:  The patient will be discharged from the PACU to home when recovered from anesthesia. We will review the cytology, pathology and microbiology results with the patient when they become available. Outpatient followup will be with Garner Nash, DO.  Garner Nash, DO [facility] 05/10/2022 11:19 AM

## 2022-05-11 ENCOUNTER — Encounter (HOSPITAL_COMMUNITY): Payer: Self-pay | Admitting: Pulmonary Disease

## 2022-05-11 LAB — CYTOLOGY - NON PAP

## 2022-05-18 ENCOUNTER — Ambulatory Visit (INDEPENDENT_AMBULATORY_CARE_PROVIDER_SITE_OTHER): Payer: Medicare Other

## 2022-05-18 ENCOUNTER — Encounter: Payer: Self-pay | Admitting: Nurse Practitioner

## 2022-05-18 ENCOUNTER — Ambulatory Visit: Payer: Medicare Other | Admitting: Nurse Practitioner

## 2022-05-18 VITALS — BP 108/54 | HR 63 | Temp 98.6°F | Ht 61.0 in | Wt 122.6 lb

## 2022-05-18 DIAGNOSIS — R918 Other nonspecific abnormal finding of lung field: Secondary | ICD-10-CM

## 2022-05-18 DIAGNOSIS — R051 Acute cough: Secondary | ICD-10-CM

## 2022-05-18 DIAGNOSIS — R059 Cough, unspecified: Secondary | ICD-10-CM | POA: Insufficient documentation

## 2022-05-18 MED ORDER — BENZONATATE 200 MG PO CAPS: 200.0000 mg | ORAL_CAPSULE | Freq: Three times a day (TID) | ORAL | 1 refills | Status: DC | PRN

## 2022-05-18 MED ORDER — PREDNISONE 20 MG PO TABS: 20.0000 mg | ORAL_TABLET | Freq: Every day | ORAL | 0 refills | Status: AC

## 2022-05-18 NOTE — Progress Notes (Signed)
@Patient  ID: Monique [name], female    DOB: [birth year], 66 y.o.   MRN: [id number]  Chief Complaint  Patient presents with   Follow-up    Follow up. Patient says she's been coughing at night since the biopsy.     Referring provider: Susy Frizzle, MD  HPI: 66 year old female, former smoker followed for adenopathy and lung nodules.  She is a patient Dr. Juline Patch and last seen in office 04/27/2022.  She has past medical history of left breast cancer in 2018 status postmastectomy followed by antiestrogen oral therapy however she was only able to tolerate this for short period and stopped it.  She was previously enrolled in lung cancer screening CT which was completed in February 2023.  This revealed a possible obstruction in the anterior segment of the right upper lobe with associated postobstructive atelectasis.  Additionally there was some other small nodules in the lung. CT chest with contrast was attempted; however, they were unable to get an adequate IV and ended up with infiltrate of IV contrast so this was rescheduled. She was then seen in office and scheduled a PET scan which showed a hypermetabolic uptake in the right hilum. She was recommended to undergo bronchoscopy which was performed on 05/10/2022 and negative for malignant cells.   TEST/EVENTS:  01/19/2022 CT chest lung cancer screening: Atherosclerosis.  Hilar regions are difficult to evaluate.  Centrilobular emphysema is present.  There is a soft tissue fullness in the right perihilar region with narrow/obstructed anterior segmental right upper lobe bronchus.  There is associated mucoid impaction and volume loss in the right upper lobe.  There are multiple bilateral pulmonary nodules measuring up to 12 mm in the central right lower lobe.  Lung RADS 4B.  There are low-attenuation lesions in the right kidney measuring up to 2.6 cm, difficult to further characterize. 04/20/2022 PET scan: Hypermetabolism corresponding to the soft tissue  fullness in the right perihilar region, just anterior to the central upper right upper lobe bronchus.  SUV max of 6.2.  Pulmonary nodules are as detailed on prior lung cancer screening CT, right lower lobe nodule which was previously 12 mm is now 8 mm.  There is low-level, nonfocal activity within the secondarily partially collapsed anterior right upper lobe.  The 2.4 cm right renal density is consistent with a cyst  05/18/2022: Today - follow up Patient presents today for follow up after bronchoscopy performed on 5/23 with cytology that was negative for malignancy. She reports feeling well since the procedure aside from a persistent dry, hacking cough. She denies any associated increased SOB, wheezing, hemoptysis, fevers. Overall feeling well without any concerns or complaints.   Allergies  Allergen Reactions   Chantix [Varenicline]     Hallucination   Codeine Nausea And Vomiting   Wellbutrin [Bupropion] Other (See Comments)    Hallucinations      Immunization History  Administered Date(s) Administered   Influenza-Unspecified 10/15/2010, 10/19/2018   Tdap 10/14/2010    Past Medical History:  Diagnosis Date   Abnormal EKG    Anxiety    Phreesia 11/29/2020   Arthritis    Phreesia 11/29/2020   Barrett's esophagus    Breast cancer ([place]) 2018   Left breast   Cancer ([place])    Phreesia 97/67/3419   Complication of anesthesia    Depression    Family history of breast cancer    Family history of prostate cancer    Family history of thyroid cancer    Fatigue  Generalized headaches    Hepatitis    IBS (irritable bowel syndrome)    Meniere disease    Osteopenia    PONV (postoperative nausea and vomiting)    Psoriasis    Tinnitus    left ear    Tobacco History: Social History   Tobacco Use  Smoking Status Former   Packs/day: 0.50   Types: Cigarettes   Quit date: 2018   Years since quitting: 5.4   Passive exposure: Yes  Smokeless Tobacco Never   Counseling given: Not  Answered   Outpatient Medications Prior to Visit  Medication Sig Dispense Refill   APPLE CIDER VINEGAR PO Take by mouth.     calcium carbonate (OS-CAL - DOSED IN MG OF ELEMENTAL CALCIUM) 1250 (500 Ca) MG tablet Take 1 tablet by mouth.     CRANBERRY PO Take by mouth.     diazepam (VALIUM) 10 MG tablet Take 1 tablet (10 mg total) by mouth every 12 (twelve) hours as needed for anxiety. (Patient taking differently: Take 10 mg by mouth at bedtime.) 30 tablet 0   hydrocortisone (ANUSOL-HC) 25 MG suppository Place 1 suppository (25 mg total) rectally 2 (two) times daily. (Patient taking differently: Place 25 mg rectally 2 (two) times daily as needed for hemorrhoids.) 12 suppository 0   Multiple Vitamins-Minerals (AIRBORNE PO) Take by mouth.     HYDROcodone-acetaminophen (NORCO) 5-325 MG tablet Take 1 tablet by mouth every 6 (six) hours as needed for moderate pain. (Patient not taking: Reported on 05/03/2022) 30 tablet 0   Facility-Administered Medications Prior to Visit  Medication Dose Route Frequency Provider Last Rate Last Admin   0.9 %  sodium chloride infusion  500 mL Intravenous Once Daryel November, MD         Review of Systems:   Constitutional: No weight loss or gain, night sweats, fevers, chills, fatigue, or lassitude. HEENT: No headaches, difficulty swallowing, tooth/dental problems, or sore throat. No sneezing, itching, ear ache, nasal congestion, or post nasal drip CV:  No chest pain, orthopnea, PND, swelling in lower extremities, anasarca, dizziness, palpitations, syncope Resp: +dry, hacking cough. No shortness of breath with exertion or at rest. No excess mucus or change in color of mucus.  No hemoptysis. No wheezing.  No chest wall deformity Skin: No rash, lesions, ulcerations MSK:  No joint pain or swelling.  No decreased range of motion.  No back pain. Neuro: No dizziness or lightheadedness.  Psych: No depression or anxiety. Mood stable.     Physical Exam:  BP (!)  108/54 (BP Location: Right Arm, Patient Position: Sitting, Cuff Size: Normal)   Pulse 63   Temp 98.6 F (37 C) (Oral)   Ht 5\' 1"  (1.549 m)   Wt 122 lb 9.6 oz (55.6 kg)   SpO2 98%   BMI 23.17 kg/m   GEN: Pleasant, interactive, well-appearing; in no acute distress. HEENT:  Normocephalic and atraumatic. PERRLA. Sclera white. Nasal turbinates pink, moist and patent bilaterally. No rhinorrhea present. Oropharynx pink and moist, without exudate or edema. No lesions, ulcerations, or postnasal drip.  NECK:  Supple w/ fair ROM. No JVD present. Normal carotid impulses w/o bruits. Thyroid symmetrical with no goiter or nodules palpated. No lymphadenopathy.   CV: RRR, no m/r/g, no peripheral edema. Pulses intact, +2 bilaterally. No cyanosis, pallor or clubbing. PULMONARY:  Unlabored, regular breathing. Clear bilaterally A&P w/o wheezes/rales/rhonchi. No accessory muscle use. No dullness to percussion. GI: BS present and normoactive. Soft, non-tender to palpation. No organomegaly or masses detected.  No CVA tenderness. Neuro: A/Ox3. No focal deficits noted.   Skin: Warm, no lesions or rashe Psych: Normal affect and behavior. Judgement and thought content appropriate.     Lab Results:  CBC    Component Value Date/Time   WBC 6.2 05/10/2022 0918   RBC 4.91 05/10/2022 0918   HGB 14.0 05/10/2022 0918   HGB 14.1 05/24/2017 0825   HCT 42.8 05/10/2022 0918   HCT 41.6 05/24/2017 0825   PLT 325 05/10/2022 0918   PLT 272 05/24/2017 0825   MCV 87.2 05/10/2022 0918   MCV 87.5 05/24/2017 0825   MCH 28.5 05/10/2022 0918   MCHC 32.7 05/10/2022 0918   RDW 12.4 05/10/2022 0918   RDW 13.1 05/24/2017 0825   LYMPHSABS 2,451 12/02/2021 0859   LYMPHSABS 2.6 05/24/2017 0825   MONOABS 0.4 05/24/2017 0825   EOSABS 391 12/02/2021 0859   EOSABS 0.3 05/24/2017 0825   BASOSABS 50 12/02/2021 0859   BASOSABS 0.1 05/24/2017 0825    BMET    Component Value Date/Time   NA 138 05/10/2022 0918   NA 138 05/24/2017  0825   K 4.6 05/10/2022 0918   K 4.1 05/24/2017 0825   CL 105 05/10/2022 0918   CO2 24 05/10/2022 0918   CO2 27 05/24/2017 0825   GLUCOSE 98 05/10/2022 0918   GLUCOSE 98 05/24/2017 0825   BUN 10 05/10/2022 0918   BUN 6.0 (L) 05/24/2017 0825   CREATININE 0.92 05/10/2022 0918   CREATININE 0.84 12/02/2021 0859   CREATININE 0.8 05/24/2017 0825   CALCIUM 9.4 05/10/2022 0918   CALCIUM 9.8 05/24/2017 0825   GFRNONAA >60 05/10/2022 0918   GFRNONAA 75 08/28/2020 1229   GFRAA 87 08/28/2020 1229    BNP No results found for: BNP   Imaging:  DG Chest 2 View  Result Date: 05/18/2022 CLINICAL DATA:  Persistent cough post bronchoscopy, history lung cancer EXAM: CHEST - 2 VIEW COMPARISON:  11/19/2020 FINDINGS: Normal heart size, mediastinal contours, and pulmonary vascularity. Persistent scarring and abnormal density RIGHT suprahilar unchanged likely related to prior treatment for lung cancer in corresponding to atelectasis and scarring on prior PET-CT. Remaining lungs clear. Elevation of minor fissure. No definite infiltrate, pleural effusion, or pneumothorax. No acute osseous findings. IMPRESSION: Chronic RIGHT perihilar changes in the RIGHT upper lobe region likely related to prior treatment for lung cancer. No definite acute abnormalities. Electronically Signed   By: Lavonia Dana M.D.   On: 05/18/2022 11:36   NM PET Image Initial (PI) Skull Base To Thigh (F-18 FDG)  Result Date: 04/21/2022 CLINICAL DATA:  Initial treatment strategy for lung cancer screening CT demonstrating soft tissue fullness in the right perihilar region with pulmonary nodules. EXAM: NUCLEAR MEDICINE PET SKULL BASE TO THIGH TECHNIQUE: 5.9 mCi F-18 FDG was injected intravenously. Full-ring PET imaging was performed from the skull base to thigh after the radiotracer. CT data was obtained and used for attenuation correction and anatomic localization. Fasting blood glucose: 97 mg/dl COMPARISON:  Chest CT 01/19/2022. FINDINGS:  Mediastinal blood pool activity: SUV max 2.6 Liver activity: SUV max NA NECK: No areas of abnormal hypermetabolism. Incidental CT findings: No cervical adenopathy. CHEST: Hypermetabolism corresponding to the soft tissue fullness in the right perihilar region, just anterior to the central right upper lobe bronchus. Example at a S.U.V. max of 6.2 on 66/4. Low-level, nonfocal activity within the secondarily partially collapsed anterior right upper lobe. Example a S.U.V. max of 2.2 on image 29/7. No hypermetabolism to correspond to the right lower lobe  pulmonary nodules detailed on prior CT. Incidental CT findings: Centrilobular emphysema. Bibasilar scarring. Scattered pulmonary nodules have been detailed previously. Example central right lower lobe at 8 mm on 41/7. Aortic and coronary artery calcification. ABDOMEN/PELVIS: No abdominopelvic parenchymal or nodal hypermetabolism. Incidental CT findings: 2.4 cm right renal low-density lesion is consistent with a cyst . No f/up imaging recommended. Normal noncontrast appearance of the liver, gallbladder, spleen, stomach, pancreas, adrenal glands, left kidney, urinary bladder, uterus, adnexa. Abdominal aortic atherosclerosis. No bowel obstruction. SKELETON: No abnormal marrow activity. Incidental CT findings: none IMPRESSION: 1. Focal moderate hypermetabolism corresponding to the area of right perihilar soft tissue fullness, suspicious for nodal or central right upper lobe primary bronchogenic carcinoma. Consider bronchoscopy with attention to the soft tissues anterior to the central right upper lobe bronchus. 2. No findings of hypermetabolic extrathoracic disease. Pulmonary nodules as detailed on prior lung cancer screening CT, below PET resolution. 3. Incidental findings, including: Aortic atherosclerosis (ICD10-I70.0), coronary artery atherosclerosis and emphysema (ICD10-J43.9). Electronically Signed   By: Abigail Miyamoto M.D.   On: 04/21/2022 11:17          View : No  data to display.          No results found for: NITRICOXIDE      Assessment & Plan:   Lung mass Soft tissue fullness in right hilar region. Bronchoscopy with cytology negative for malignancy. Will discuss case with Dr. Valeta Harms for next steps. Advised patient that we would likely obtain follow up CT chest in 3 months to ensure stability.   Patient Instructions  Prednisone 20 mg for 5 days. Take in AM with food Tessalon Perles (benzonatate) 1 capsule Three times a day as needed for cough Delsym 2 tsp Twice daily for cough   Chest x ray today.   CT chest in 3 months   Follow up in 3 months with Dr. Valeta Harms after CT scan. If symptoms do not improve or worsen, please contact office for sooner follow up or seek emergency care.     Cough Post procedural irritation/bronchitis. Tx with short prednisone burst and cough control measures. CXR today without superimposed infection or other acute process.    I spent 32 minutes of dedicated to the care of this patient on the date of this encounter to include pre-visit review of records, face-to-face time with the patient discussing conditions above, post visit ordering of testing, clinical documentation with the electronic health record, making appropriate referrals as documented, and communicating necessary findings to members of the patients care team.  Clayton Bibles, NP 05/18/2022  Pt aware and understands NP's role.

## 2022-05-18 NOTE — Assessment & Plan Note (Signed)
Post procedural irritation/bronchitis. Tx with short prednisone burst and cough control measures. CXR today without superimposed infection or other acute process.

## 2022-05-18 NOTE — Patient Instructions (Addendum)
Prednisone 20 mg for 5 days. Take in AM with food Tessalon Perles (benzonatate) 1 capsule Three times a day as needed for cough Delsym 2 tsp Twice daily for cough   Chest x ray today.   CT chest in 3 months   Follow up in 3 months with Dr. Valeta Harms after CT scan. If symptoms do not improve or worsen, please contact office for sooner follow up or seek emergency care.

## 2022-05-18 NOTE — Assessment & Plan Note (Signed)
Soft tissue fullness in right hilar region. Bronchoscopy with cytology negative for malignancy. Will discuss case with Dr. Valeta Harms for next steps. Advised patient that we would likely obtain follow up CT chest in 3 months to ensure stability.   Patient Instructions  Prednisone 20 mg for 5 days. Take in AM with food Tessalon Perles (benzonatate) 1 capsule Three times a day as needed for cough Delsym 2 tsp Twice daily for cough   Chest x ray today.   CT chest in 3 months   Follow up in 3 months with Dr. Valeta Harms after CT scan. If symptoms do not improve or worsen, please contact office for sooner follow up or seek emergency care.

## 2022-05-24 DIAGNOSIS — L821 Other seborrheic keratosis: Secondary | ICD-10-CM | POA: Diagnosis not present

## 2022-05-24 DIAGNOSIS — L609 Nail disorder, unspecified: Secondary | ICD-10-CM | POA: Diagnosis not present

## 2022-05-30 DIAGNOSIS — L509 Urticaria, unspecified: Secondary | ICD-10-CM | POA: Diagnosis not present

## 2022-05-30 DIAGNOSIS — L609 Nail disorder, unspecified: Secondary | ICD-10-CM | POA: Diagnosis not present

## 2022-05-31 ENCOUNTER — Ambulatory Visit: Payer: Medicare Other | Admitting: Acute Care

## 2022-06-02 ENCOUNTER — Ambulatory Visit: Payer: Medicare Other

## 2022-06-08 DIAGNOSIS — C50912 Malignant neoplasm of unspecified site of left female breast: Secondary | ICD-10-CM | POA: Diagnosis not present

## 2022-06-14 ENCOUNTER — Ambulatory Visit: Payer: Medicare Other | Admitting: Acute Care

## 2022-06-27 ENCOUNTER — Other Ambulatory Visit: Payer: Self-pay | Admitting: Family Medicine

## 2022-06-27 ENCOUNTER — Telehealth: Payer: Self-pay

## 2022-06-27 MED ORDER — DIAZEPAM 10 MG PO TABS: 10.0000 mg | ORAL_TABLET | Freq: Two times a day (BID) | ORAL | 0 refills | Status: DC | PRN

## 2022-06-27 NOTE — Telephone Encounter (Signed)
Pharmacy faxed a refill request for diazepam (VALIUM) 10 MG tablet [[id number]]    Order Details Dose: 10 mg Route: Oral Frequency: Every 12 hours PRN for anxiety  Dispense Quantity: 30 tablet Refills: 0    LOV: 05/03/22

## 2022-06-29 ENCOUNTER — Encounter (HOSPITAL_BASED_OUTPATIENT_CLINIC_OR_DEPARTMENT_OTHER): Payer: Self-pay | Admitting: Cardiology

## 2022-06-29 ENCOUNTER — Ambulatory Visit (HOSPITAL_BASED_OUTPATIENT_CLINIC_OR_DEPARTMENT_OTHER): Payer: Medicare Other | Admitting: Cardiology

## 2022-06-29 VITALS — BP 122/72 | HR 66 | Ht 61.0 in | Wt 121.0 lb

## 2022-06-29 DIAGNOSIS — I2510 Atherosclerotic heart disease of native coronary artery without angina pectoris: Secondary | ICD-10-CM | POA: Diagnosis not present

## 2022-06-29 DIAGNOSIS — Z712 Person consulting for explanation of examination or test findings: Secondary | ICD-10-CM | POA: Diagnosis not present

## 2022-06-29 DIAGNOSIS — Z79899 Other long term (current) drug therapy: Secondary | ICD-10-CM | POA: Diagnosis not present

## 2022-06-29 DIAGNOSIS — Z7189 Other specified counseling: Secondary | ICD-10-CM

## 2022-06-29 DIAGNOSIS — Z87891 Personal history of nicotine dependence: Secondary | ICD-10-CM | POA: Diagnosis not present

## 2022-06-29 MED ORDER — ASPIRIN 81 MG PO TBEC: 81.0000 mg | DELAYED_RELEASE_TABLET | Freq: Every day | ORAL | 3 refills | Status: DC

## 2022-06-29 MED ORDER — ROSUVASTATIN CALCIUM 10 MG PO TABS: 10.0000 mg | ORAL_TABLET | Freq: Every day | ORAL | 3 refills | Status: DC

## 2022-06-29 NOTE — Patient Instructions (Addendum)
Medication Instructions:  START: Aspirin 81 mg daily START: Rosuvastatin 10 mg daily  *If you need a refill on your cardiac medications before your next appointment, please call your pharmacy*   Lab Work: Your provider has recommended lab work September, 2023 (Lipid, LFT). Please have this collected at [facility] [place]. The lab is open 8:00 am - 4:30 pm. Please avoid 12:00p - 1:00p for lunch hour. You do not need an appointment. Please go to [address] [place], [place] [id number]. This is in the Primary Care office on the 3rd floor, let them know you are there for blood work and they will direct you to the lab.  If you have labs (blood work) drawn today and your tests are completely normal, you will receive your results only by: [place] (if you have MyChart) OR A paper copy in the mail If you have any lab test that is abnormal or we need to change your treatment, we will call you to review the results.   Testing/Procedures: None ordered today   Follow-Up: At [facility], you and your health needs are our priority.  As part of our continuing mission to provide you with exceptional heart care, we have created designated Provider Care Teams.  These Care Teams include your primary Cardiologist (physician) and Advanced Practice Providers (APPs -  Physician Assistants and Nurse Practitioners) who all work together to provide you with the care you need, when you need it.  We recommend signing up for the patient portal called "MyChart".  Sign up information is provided on this After Visit Summary.  MyChart is used to connect with patients for Virtual Visits (Telemedicine).  Patients are able to view lab/test results, encounter notes, upcoming appointments, etc.  Non-urgent messages can be sent to your provider as well.   To learn more about what you can do with MyChart, go to [email or web address].    Your next appointment:   As needed  The format  for your next appointment:   In Person  Provider:   Buford Dresser, MD{   Recent data, summarized from [facility] from a study from the [facility]:  https://wilkins.info/  "Researchers at the [facility] set out to answer this question by comparing statins to supplements in a clinical trial. They tracked the outcomes of 190 adults, ages 74 to 63. Some participants were given a 5 mg daily dose of rosuvastatin, a statin that is sold under the brand name Crestor for 28 days. Others were given supplements, including fish oil, cinnamon, garlic, turmeric, plant sterols or red yeast rice for the same period."  "What we found was that rosuvastatin lowered LDL cholesterol by almost 38% and that was vastly superior to placebo and any of the six supplements studied in the trial," study Despina Hidden, M.D. of the [facility], [place] told NPR. He says this level of reduction is enough to lower the risk of heart attacks and strokes. The findings are published in the Journal of the [employer] of Cardiology.  "Oftentimes these supplements are marketed as 'natural ways' to lower your cholesterol," says Laffin. But he says none of the dietary supplements demonstrated any significant decrease in LDL cholesterol compared with a placebo. LDL cholesterol is considered the 'bad cholesterol' because it can contribute to plaque build-up in the artery walls - which can narrow the arteries, and set the stage for heart attacks and strokes"

## 2022-06-29 NOTE — Progress Notes (Signed)
Cardiology Office Note:    Date:  06/29/2022   ID:  Monique [name], DOB [birth year], MRN [id number]  PCP:  Monique Frizzle, MD  Cardiologist:  Buford Dresser, MD  Referring MD: Garner Nash, DO   CC: New patient evaluation for coronary artery calcification   History of Present Illness:    Monique [name] is a 66 y.o. female with a hx of Meniere disease, left breast cancer s/p mastectomy, hepatitis, Barrett's esophagus, arthritis, and prosiasis, who is seen as a new consult at the request of [facility], Octavio Graves, DO for the evaluation and management of coronary artery calcification.  She saw Dr. Janne [name] on 04/27/22. Her chest CT for lung cancer screening performed on 01/2022 was reviewed, which showed evidence of atherosclerotic calcification and coronary artery calcification. She was referred to cardiology for further evaluation.   Cardiovascular risk factors: Prior clinical ASCVD: None. Prior to her recent testing she was unaware of any cardiovascular issues. Comorbid conditions: None. Metabolic syndrome/Obesity: Her highest adult weight was 138 lbs. Currently she is 121 lbs. Chronic inflammatory conditions: Tobacco use history: She previously smoked for 40 years. She quit smoking 5 years ago. Family history: Her father has arrhythmia. Her maternal side is notable for arthritis but no known cardiovascular history. Prior cardiac testing and/or incidental findings on other testing (ie coronary calcium): On 01/19/22 a chest CT for lung cancer screening revealed atherosclerotic calcification of the aorta, aortic valve and coronary arteries. Heart size was normal, no pericardial effusion. PET scan on 04/20/2022 confirmed incidental findings of aortic atherosclerosis and coronary artery atherosclerosis. Exercise level: She used to dance every night for 2 hours. She also stays active with caring for animals at home. Current diet: She stopped all of her supplements when she was concerned that  she had lung cancer. For a period of 8 months she was limited to 8% daily fat due to 3 gallstone attacks. Since then she gradually increased the fat in her diet. She uses plant-based butter. Frequently snacks around 7 PM with Goldfish or Cheez-Its. Also she has been drinking more sodas, but wants to work on cutting back. Generally she states that she knows how to eat well, but has fallen out of the habit.  Years ago she had some numbness in her right arm, but this has not recurred. She denies any chest pain or pressure.  She denies any palpitations, shortness of breath, or peripheral edema. No lightheadedness, headaches, syncope, orthopnea, or PND.  Past Medical History:  Diagnosis Date   Abnormal EKG    Anxiety    Phreesia 11/29/2020   Arthritis    Phreesia 11/29/2020   Barrett's esophagus    Breast cancer ([place]) 2018   Left breast   Cancer ([place])    Phreesia 11/94/1740   Complication of anesthesia    Depression    Family history of breast cancer    Family history of prostate cancer    Family history of thyroid cancer    Fatigue    Generalized headaches    Hepatitis    IBS (irritable bowel syndrome)    Meniere disease    Osteopenia    PONV (postoperative nausea and vomiting)    Psoriasis    Tinnitus    left ear    Past Surgical History:  Procedure Laterality Date   BREAST SURGERY N/A    Phreesia 11/29/2020   DILATION AND CURETTAGE OF UTERUS     FINE NEEDLE ASPIRATION  05/10/2022   Procedure: FINE NEEDLE  ASPIRATION (FNA) LINEAR;  Surgeon: Garner Nash, DO;  Location: [facility];  Service: Pulmonary;;   MASTECTOMY Left 2018   MASTECTOMY W/ SENTINEL NODE BIOPSY Left 08/03/2017   Procedure: LEFT MASTECTOMY WITH LEFT AXILLARY SENTINEL LYMPH NODE BIOPSY;  Surgeon: Alphonsa Overall, MD;  Location: [place] [facility];  Service: General;  Laterality: Left;   skin cancer removal     TONSILLECTOMY     VIDEO BRONCHOSCOPY WITH ENDOBRONCHIAL ULTRASOUND N/A 05/10/2022    Procedure: VIDEO BRONCHOSCOPY WITH ENDOBRONCHIAL ULTRASOUND;  Surgeon: Garner Nash, DO;  Location: [facility];  Service: Pulmonary;  Laterality: N/A;    Current Medications: Current Outpatient Medications on File Prior to Visit  Medication Sig   diazepam (VALIUM) 10 MG tablet Take 1 tablet (10 mg total) by mouth every 12 (twelve) hours as needed for anxiety. (Patient taking differently: Take 5 mg by mouth every 12 (twelve) hours as needed for anxiety.)   Current Facility-Administered Medications on File Prior to Visit  Medication   0.9 %  sodium chloride infusion     Allergies:   Chantix [varenicline], Codeine, and Wellbutrin [bupropion]   Social History   Tobacco Use   Smoking status: Former    Packs/day: 0.50    Types: Cigarettes    Quit date: 2018    Years since quitting: 5.5    Passive exposure: Yes   Smokeless tobacco: Never  Vaping Use   Vaping Use: Never used  Substance Use Topics   Alcohol use: Yes    Comment: occas   Drug use: Never    Family History: family history includes Breast cancer in her cousin; Breast cancer (age of onset: 73) in her cousin; Breast cancer (age of onset: 82) in her paternal aunt; Healthy in her maternal aunt, maternal uncle, paternal aunt, and paternal uncle; Heart attack in her father; Hypertension in her father and mother; Prostate cancer in her paternal uncle; Thyroid cancer in her paternal uncle. There is no history of Colon cancer, Esophageal cancer, Rectal cancer, or Stomach cancer.  ROS:   Please see the history of present illness.  Additional pertinent ROS: Constitutional: Negative for chills, fever, night sweats, unintentional weight loss  HENT: Negative for ear pain and hearing loss. Positive for tinnitus. Eyes: Negative for loss of vision and eye pain.  Respiratory: Negative for cough, sputum, wheezing.   Cardiovascular: See HPI. Gastrointestinal: Negative for abdominal pain, melena, and hematochezia.  Genitourinary:  Negative for dysuria and hematuria.  Musculoskeletal: Negative for falls and myalgias. Positive for bilateral knee pain.  Skin: Negative for itching and rash.  Neurological: Negative for focal weakness, focal sensory changes and loss of consciousness.  Endo/Heme/Allergies: Does not bruise/bleed easily.     EKGs/Labs/Other Studies Reviewed:    The following studies were reviewed today:  NM PET scan 04/20/2022: IMPRESSION: 1. Focal moderate hypermetabolism corresponding to the area of right perihilar soft tissue fullness, suspicious for nodal or central right upper lobe primary bronchogenic carcinoma. Consider bronchoscopy with attention to the soft tissues anterior to the central right upper lobe bronchus. 2. No findings of hypermetabolic extrathoracic disease. Pulmonary nodules as detailed on prior lung cancer screening CT, below PET resolution. 3. Incidental findings, including: Aortic atherosclerosis (ICD10-I70.0), coronary artery atherosclerosis and emphysema (ICD10-J43.9).  CT Chest  01/19/2022: FINDINGS: Cardiovascular: Atherosclerotic calcification of the aorta, aortic valve and coronary arteries. Heart size normal. No pericardial effusion.   Mediastinum/Nodes: No pathologically enlarged mediastinal or axillary lymph nodes. Hilar regions are difficult to definitively evaluate without IV  contrast. Esophagus is grossly unremarkable.   Lungs/Pleura: Centrilobular emphysema. Soft tissue fullness in the right perihilar region with narrow/obstructed anterior segmental right upper lobe bronchus. Associated mucoid impaction and volume loss in the right upper lobe. Multiple bilateral pulmonary nodules measure up to 12.0 mm in the central right lower lobe (9/202). No pleural fluid. Airway is otherwise unremarkable.   Upper Abdomen: Visualized portions of the liver and adrenal glands are unremarkable. Low-attenuation lesions in the right kidney measure up to 2.6 cm but are  difficult to further characterize due to size or lack of complete imaging. Visualized portions of the kidneys, spleen, pancreas, stomach and bowel are otherwise unremarkable.   Musculoskeletal: No worrisome lytic or sclerotic lesions. Left mastectomy. Old right rib fractures.   IMPRESSION: 1. Lung-RADS 4B, suspicious. Additional imaging evaluation or consultation with Pulmonology or Thoracic Surgery recommended.   Soft tissue fullness in the right perihilar region with associated obstruction of the anterior segmental right upper lobe bronchus and associated volume loss. Multiple additional pulmonary nodules measure up to 12.0 mm in the right lower lobe. Further initial evaluation with diagnostic CT chest with contrast is recommended.   These results will be called to the ordering clinician or representative by the Radiologist Assistant, and communication documented in the PACS or [employer].  2. Aortic atherosclerosis (ICD10-I70.0). Coronary artery calcification.   EKG:  EKG is personally reviewed.   06/29/2022:  NSR at 66 bpm  Recent Labs: 12/02/2021: ALT 12 05/10/2022: BUN 10; Creatinine, Ser 0.92; Hemoglobin 14.0; Platelets 325; Potassium 4.6; Sodium 138   Recent Lipid Panel    Component Value Date/Time   CHOL 195 12/02/2021 0859   TRIG 85 12/02/2021 0859   HDL 65 12/02/2021 0859   CHOLHDL 3.0 12/02/2021 0859   LDLCALC 112 (H) 12/02/2021 0859    Physical Exam:    VS:  BP 122/72   Pulse 66   Ht 5\' 1"  (1.549 m)   Wt 121 lb (54.9 kg)   BMI 22.86 kg/m     Wt Readings from Last 3 Encounters:  06/29/22 121 lb (54.9 kg)  05/18/22 122 lb 9.6 oz (55.6 kg)  05/10/22 120 lb (54.4 kg)    GEN: Well nourished, well developed in no acute distress HEENT: Normal, moist mucous membranes NECK: No JVD CARDIAC: regular rhythm, normal S1 and S2, no rubs or gallops. No murmur. VASCULAR: Radial and DP pulses 2+ bilaterally. No carotid bruits RESPIRATORY:  Clear to  auscultation without rales, wheezing or rhonchi  ABDOMEN: Soft, non-tender, non-distended MUSCULOSKELETAL:  Ambulates independently SKIN: Warm and dry, no edema NEUROLOGIC:  Alert and oriented x 3. No focal neuro deficits noted. PSYCHIATRIC:  Normal affect    ASSESSMENT:    1. Nonocclusive coronary atherosclerosis of native coronary artery   2. Coronary artery calcification seen on CT scan   3. Encounter to discuss test results   4. Cardiac risk counseling   5. Counseling on health promotion and disease prevention   6. Former tobacco use   7. Medication management    PLAN:    Coronary calcification, consistent with nonobstructive CAD History of former tobacco use as risk factor -we discussed CT in depth.  -We discussed the pathophysiology of cholesterol plaque formation, the role of calcium and why it is a marker, how plaque is key to acute MI/CVA, and how known plaque is managed with medications.   --we discussed the data on statins, both in terms of their long term benefit as well as the risk of  side effects. Reviewed common misconceptions about statins. Reviewed how we monitor treatment. After shared decision making, patient is agreeable to trialing statin.  -will start aspirin 81 mg daily, rosuvastatin 10 mg daily -follow up labs ordered for med management  Cardiac risk counseling and prevention recommendations: -recommend heart healthy/Mediterranean diet, with whole grains, fruits, vegetable, fish, lean meats, nuts, and olive oil. Limit salt. -recommend moderate walking, 3-5 times/week for 30-50 minutes each session. Aim for at least 150 minutes.week. Goal should be pace of 3 miles/hours, or walking 1.5 miles in 30 minutes -recommend avoidance of tobacco products. Avoid excess alcohol.  Plan for follow up: PRN.  Buford Dresser, MD, PhD, [facility]    Medication Adjustments/Labs and Tests Ordered: Current medicines are reviewed at length with  the patient today.  Concerns regarding medicines are outlined above.   Orders Placed This Encounter  Procedures   Lipid panel   Hepatic function panel   EKG 12-Lead   Meds ordered this encounter  Medications   aspirin EC 81 MG tablet    Sig: Take 1 tablet (81 mg total) by mouth daily. Swallow whole.    Dispense:  90 tablet    Refill:  3   rosuvastatin (CRESTOR) 10 MG tablet    Sig: Take 1 tablet (10 mg total) by mouth daily.    Dispense:  90 tablet    Refill:  3   Patient Instructions  Medication Instructions:  START: Aspirin 81 mg daily START: Rosuvastatin 10 mg daily  *If you need a refill on your cardiac medications before your next appointment, please call your pharmacy*   [name] Work: Your provider has recommended [name] work September, 2023 (Lipid, LFT). Please have this collected at [facility] [place]. The [name] is open 8:00 am - 4:30 pm. Please avoid 12:00p - 1:00p for lunch hour. You do not need an appointment. Please go to [address] [place], [place] [address]. This is in the Primary Care office on the 3rd floor, let them know you are there for blood work and they will direct you to the [name].  If you have labs (blood work) drawn today and your tests are completely normal, you will receive your results only by: [place] (if you have MyChart) OR A paper copy in the mail If you have any [name] test that is abnormal or we need to change your treatment, we will call you to review the results.   Testing/Procedures: None ordered today   Follow-Up: At [facility], you and your health needs are our priority.  As part of our continuing mission to provide you with exceptional heart care, we have created designated Provider Care Teams.  These Care Teams include your primary Cardiologist (physician) and Advanced Practice Providers (APPs -  Physician Assistants and Nurse Practitioners) who all work together to provide you with the care you need,  when you need it.  We recommend signing up for the patient portal called "MyChart".  Sign up information is provided on this After Visit Summary.  MyChart is used to connect with patients for Virtual Visits (Telemedicine).  Patients are able to view [name]/test results, encounter notes, upcoming appointments, etc.  Non-urgent messages can be sent to your provider as well.   To learn more about what you can do with MyChart, go to [email or web address].    Your next appointment:   As needed  The format for your next appointment:   In Person  Provider:  Buford Dresser, MD{   Recent data, summarized from [place] from a study from the [facility]:  https://wilkins.info/  "Researchers at the [facility] set out to answer this question by comparing statins to supplements in a clinical trial. They tracked the outcomes of 190 adults, ages 59 to 15. Some participants were given a 5 mg daily dose of rosuvastatin, a statin that is sold under the brand name Crestor for 28 days. Others were given supplements, including fish oil, cinnamon, garlic, turmeric, plant sterols or red yeast rice for the same period."  "What we found was that rosuvastatin lowered LDL cholesterol by almost 38% and that was vastly superior to placebo and any of the six supplements studied in the trial," study Despina Hidden, M.D. of the [facility], [place] told NPR. He says this level of reduction is enough to lower the risk of heart attacks and strokes. The findings are published in the Journal of the [employer] of Cardiology.  "Oftentimes these supplements are marketed as 'natural ways' to lower your cholesterol," says Laffin. But he says none of the dietary supplements demonstrated any significant decrease in LDL cholesterol compared with a placebo. LDL  cholesterol is considered the 'bad cholesterol' because it can contribute to plaque build-up in the artery walls - which can narrow the arteries, and set the stage for heart attacks and strokes"    I,Mathew Stumpf,acting as a scribe for [employer], MD.,have documented all relevant documentation on the behalf of Buford Dresser, MD,as directed by  Buford Dresser, MD while in the presence of Buford Dresser, MD.  I, Buford Dresser, MD, have reviewed all documentation for this visit. The documentation on 08/10/22 for the exam, diagnosis, procedures, and orders are all accurate and complete.   Signed, Buford Dresser, MD PhD 06/29/2022     [place]

## 2022-07-20 DIAGNOSIS — T162XXA Foreign body in left ear, initial encounter: Secondary | ICD-10-CM | POA: Diagnosis not present

## 2022-07-20 DIAGNOSIS — X501XXA Overexertion from prolonged static or awkward postures, initial encounter: Secondary | ICD-10-CM | POA: Diagnosis not present

## 2022-07-28 ENCOUNTER — Ambulatory Visit: Payer: Medicare Other | Admitting: Family Medicine

## 2022-07-28 ENCOUNTER — Telehealth: Payer: Self-pay | Admitting: Pulmonary Disease

## 2022-07-28 DIAGNOSIS — R918 Other nonspecific abnormal finding of lung field: Secondary | ICD-10-CM

## 2022-07-29 NOTE — Telephone Encounter (Signed)
Thanks Raquel Sarna.

## 2022-07-29 NOTE — Telephone Encounter (Signed)
Monique Nash, DO  You; Belenda Cruise, Karie Schwalbe, NP Just now (4:32 PM)     Per patients last office note with [facility] she recommended at ct chest in 3 months and follow up with me.   Does not appear to have been ordered???   Please order CT Chest wo contrast and keep follow up with me scheduled   Thanks   BLI    Monique Nash, DO  [facility]  07/29/2022 4:32 PM    Order for CT has been placed.nothing further needed.

## 2022-07-29 NOTE — Telephone Encounter (Signed)
Dr. Valeta Harms, please advise if you are okay with [place] placing order for CT.

## 2022-08-10 ENCOUNTER — Encounter (HOSPITAL_BASED_OUTPATIENT_CLINIC_OR_DEPARTMENT_OTHER): Payer: Self-pay | Admitting: Cardiology

## 2022-08-18 ENCOUNTER — Ambulatory Visit: Payer: Medicare Other | Admitting: Pulmonary Disease

## 2022-08-23 ENCOUNTER — Encounter (HOSPITAL_BASED_OUTPATIENT_CLINIC_OR_DEPARTMENT_OTHER): Payer: Self-pay

## 2022-08-25 ENCOUNTER — Other Ambulatory Visit: Payer: Self-pay | Admitting: Family Medicine

## 2022-08-26 MED ORDER — DIAZEPAM 10 MG PO TABS
10.0000 mg | ORAL_TABLET | Freq: Two times a day (BID) | ORAL | 0 refills | Status: DC | PRN
Start: 2022-08-26 — End: 2022-10-18

## 2022-08-30 ENCOUNTER — Telehealth: Payer: Self-pay | Admitting: Pulmonary Disease

## 2022-08-30 ENCOUNTER — Encounter: Payer: Self-pay | Admitting: Family Medicine

## 2022-08-30 ENCOUNTER — Ambulatory Visit (INDEPENDENT_AMBULATORY_CARE_PROVIDER_SITE_OTHER): Payer: Medicare Other | Admitting: Family Medicine

## 2022-08-30 VITALS — BP 112/70 | HR 57 | Temp 98.4°F | Ht 61.0 in | Wt 125.6 lb

## 2022-08-30 DIAGNOSIS — R918 Other nonspecific abnormal finding of lung field: Secondary | ICD-10-CM | POA: Diagnosis not present

## 2022-08-30 DIAGNOSIS — R399 Unspecified symptoms and signs involving the genitourinary system: Secondary | ICD-10-CM

## 2022-08-30 MED ORDER — NEOMYCIN-POLYMYXIN-HC 3.5-10000-1 OT SOLN: 3.0000 [drp] | Freq: Four times a day (QID) | OTIC | 0 refills | Status: DC

## 2022-08-30 NOTE — Progress Notes (Signed)
Subjective:    Patient ID: Monique [name], female    DOB: 1956-11-23, 66 y.o.   MRN: [id number]  HPI CT 2/23- Soft tissue fullness in the right perihilar region with associated obstruction of the anterior segmental right upper lobe bronchus and associated volume loss. Multiple additional pulmonary nodules measure up to 12.0 mm in the right lower lobe. Further initial evaluation with diagnostic CT chest with contrast is recommended  PET 5/23- IMPRESSION: 1. Focal moderate hypermetabolism corresponding to the area of right perihilar soft tissue fullness, suspicious for nodal or central right upper lobe primary bronchogenic carcinoma. Consider bronchoscopy with attention to the soft tissues anterior to the central right upper lobe bronchus. 2. No findings of hypermetabolic extrathoracic disease. Pulmonary nodules as detailed on prior lung cancer screening CT, below PET resolution. 3. Incidental findings, including: Aortic atherosclerosis  08/30/22 Patient had a bronchoscopy in May.  Pathology report showed no malignant cells.  Repeat CT scan in 62-month's.  Recently had an earbud removed from her left ear at a local urgent care.  Afterwards was given hydrocortisone with acetic acid for otitis externa.  She cannot afford the eardrops.  She is here today for evaluation.  On examination today, the auditory canals appear normal.  There is no erythema.  There is no exudate.  The tympanic membranes appear pearly gray with no evidence of otitis media.  The patient states that her hearing is back to her baseline.  She has yet to hear from the pulmonologist office about her CAT scan Past Medical History:  Diagnosis Date   Abnormal EKG    Anxiety    Phreesia 11/29/2020   Arthritis    Phreesia 11/29/2020   Barrett's esophagus    Breast cancer ([place]) 2018   Left breast   Cancer ([place])    Phreesia 07/37/1062   Complication of anesthesia    Depression    Family history of breast cancer     Family history of prostate cancer    Family history of thyroid cancer    Fatigue    Generalized headaches    Hepatitis    IBS (irritable bowel syndrome)    Meniere disease    Osteopenia    PONV (postoperative nausea and vomiting)    Psoriasis    Tinnitus    left ear   Past Surgical History:  Procedure Laterality Date   BREAST SURGERY N/A    Phreesia 11/29/2020   DILATION AND CURETTAGE OF UTERUS     FINE NEEDLE ASPIRATION  05/10/2022   Procedure: FINE NEEDLE ASPIRATION (FNA) LINEAR;  Surgeon: Garner Nash, DO;  Location: [facility];  Service: Pulmonary;;   MASTECTOMY Left 2018   MASTECTOMY W/ SENTINEL NODE BIOPSY Left 08/03/2017   Procedure: LEFT MASTECTOMY WITH LEFT AXILLARY SENTINEL LYMPH NODE BIOPSY;  Surgeon: Alphonsa Overall, MD;  Location: [facility];  Service: General;  Laterality: Left;   skin cancer removal     TONSILLECTOMY     VIDEO BRONCHOSCOPY WITH ENDOBRONCHIAL ULTRASOUND N/A 05/10/2022   Procedure: VIDEO BRONCHOSCOPY WITH ENDOBRONCHIAL ULTRASOUND;  Surgeon: Garner Nash, DO;  Location: [facility];  Service: Pulmonary;  Laterality: N/A;   Current Outpatient Medications on File Prior to Visit  Medication Sig Dispense Refill   aspirin EC 81 MG tablet Take 1 tablet (81 mg total) by mouth daily. Swallow whole. 90 tablet 3   diazepam (VALIUM) 10 MG tablet Take 1 tablet (10 mg total) by mouth every 12 (twelve) hours as needed for  anxiety. 30 tablet 0   rosuvastatin (CRESTOR) 10 MG tablet Take 1 tablet (10 mg total) by mouth daily. 90 tablet 3   Current Facility-Administered Medications on File Prior to Visit  Medication Dose Route Frequency Provider Last Rate Last Admin   0.9 %  sodium chloride infusion  500 mL Intravenous Once Daryel November, MD       Allergies  Allergen Reactions   Chantix [Varenicline]     Hallucination   Codeine Nausea And Vomiting   Wellbutrin [Bupropion] Other (See Comments)    Hallucinations     Social History    Socioeconomic History   Marital status: Married    Spouse name: Not on file   Number of children: Not on file   Years of education: Not on file   Highest education level: Not on file  Occupational History   Not on file  Tobacco Use   Smoking status: Former    Packs/day: 0.50    Types: Cigarettes    Quit date: 2018    Years since quitting: 5.6    Passive exposure: Yes   Smokeless tobacco: Never  Vaping Use   Vaping Use: Never used  Substance and Sexual Activity   Alcohol use: Yes    Comment: occas   Drug use: Never   Sexual activity: Not Currently    Birth control/protection: Post-menopausal  Other Topics Concern   Not on file  Social History Narrative   Not on file   Social Determinants of Health   Financial Resource Strain: Low Risk  (01/11/2022)   Overall Financial Resource Strain (CARDIA)    Difficulty of Paying Living Expenses: Not very hard  Food Insecurity: Food Insecurity Present (01/11/2022)   Hunger Vital Sign    Worried About Running Out of Food in the Last Year: Sometimes true    Ran Out of Food in the Last Year: Never true  Transportation Needs: No Transportation Needs (01/11/2022)   PRAPARE - Hydrologist (Medical): No    Lack of Transportation (Non-Medical): No  Physical Activity: Sufficiently Active (01/11/2022)   Exercise Vital Sign    Days of Exercise per Week: 5 days    Minutes of Exercise per Session: 30 min  Stress: Stress Concern Present (01/11/2022)   [place]    Feeling of Stress : Rather much  Social Connections: Moderately Isolated (01/11/2022)   Social Connection and Isolation Panel [NHANES]    Frequency of Communication with Friends and Family: More than three times a week    Frequency of Social Gatherings with Friends and Family: Once a week    Attends Religious Services: Never    Marine scientist or Organizations: No    Attends Theatre manager Meetings: Never    Marital Status: Married  Human resources officer Violence: Not At Risk (01/11/2022)   Humiliation, Afraid, Rape, and Kick questionnaire    Fear of Current or Ex-Partner: No    Emotionally Abused: No    Physically Abused: No    Sexually Abused: No      Review of Systems  All other systems reviewed and are negative.      Objective:   Physical Exam Vitals reviewed.  Constitutional:      General: She is not in acute distress.    Appearance: She is well-developed. She is not diaphoretic.  HENT:     Head: Normocephalic and atraumatic.     Right Ear:  Tympanic membrane, ear canal and external ear normal.     Left Ear: Tympanic membrane, ear canal and external ear normal.     Nose: Nose normal.     Mouth/Throat:     Pharynx: No oropharyngeal exudate.  Eyes:     General: No scleral icterus.       Right eye: No discharge.        Left eye: No discharge.     Conjunctiva/sclera: Conjunctivae normal.     Pupils: Pupils are equal, round, and reactive to light.  Neck:     Thyroid: No thyromegaly.     Vascular: No JVD.     Trachea: No tracheal deviation.  Cardiovascular:     Rate and Rhythm: Regular rhythm.     Heart sounds: Normal heart sounds. No murmur heard.    No friction rub. No gallop.  Pulmonary:     Effort: Pulmonary effort is normal. No respiratory distress.     Breath sounds: Normal breath sounds. No stridor. No wheezing or rales.  Chest:     Chest wall: No tenderness.  Abdominal:     General: Bowel sounds are normal. There is no distension.     Palpations: Abdomen is soft. There is no mass.     Tenderness: There is no abdominal tenderness. There is no guarding or rebound.     Hernia: No hernia is present.  Musculoskeletal:        General: No deformity.     Cervical back: Normal range of motion and neck supple.  Lymphadenopathy:     Cervical: No cervical adenopathy.  Skin:    Coloration: Skin is not pale.     Findings: No erythema or rash.   Neurological:     Mental Status: She is alert and oriented to person, place, and time.     Cranial Nerves: No cranial nerve deficit.     Motor: No abnormal muscle tone.     Coordination: Coordination normal.     Deep Tendon Reflexes: Reflexes normal.  Psychiatric:        Behavior: Behavior normal.        Thought Content: Thought content normal.        Judgment: Judgment normal.           Assessment & Plan:  UTI symptoms - Plan: Urinalysis, Routine w reflex microscopic  Lung mass Patient does not require any treatment for otitis externa.  This is resolved spontaneously.  She does have some mild dysuria so I will check a urinalysis.  I encouraged her to contact pulmonology to determine when to go to perform the next CAT scan as it appears to be doing at the present time

## 2022-08-31 LAB — URINALYSIS, ROUTINE W REFLEX MICROSCOPIC
Bilirubin Urine: NEGATIVE
Glucose, UA: NEGATIVE
Hgb urine dipstick: NEGATIVE
Ketones, ur: NEGATIVE
Leukocytes,Ua: NEGATIVE
Nitrite: NEGATIVE
Protein, ur: NEGATIVE
Specific Gravity, Urine: 1.008 (ref 1.001–1.035)
pH: 6.5 (ref 5.0–8.0)

## 2022-09-22 ENCOUNTER — Other Ambulatory Visit: Payer: Self-pay | Admitting: Family Medicine

## 2022-09-22 ENCOUNTER — Ambulatory Visit
Admission: RE | Admit: 2022-09-22 | Discharge: 2022-09-22 | Disposition: A | Discharge status: Home / Self Care | Payer: Medicare Other | Source: Ambulatory Visit | Attending: Pulmonary Disease | Admitting: Pulmonary Disease

## 2022-09-22 DIAGNOSIS — J9811 Atelectasis: Secondary | ICD-10-CM | POA: Diagnosis not present

## 2022-09-22 DIAGNOSIS — I700 Atherosclerosis of aorta: Secondary | ICD-10-CM | POA: Diagnosis not present

## 2022-09-22 DIAGNOSIS — R918 Other nonspecific abnormal finding of lung field: Secondary | ICD-10-CM

## 2022-09-22 DIAGNOSIS — R911 Solitary pulmonary nodule: Secondary | ICD-10-CM | POA: Diagnosis not present

## 2022-09-22 DIAGNOSIS — J479 Bronchiectasis, uncomplicated: Secondary | ICD-10-CM | POA: Diagnosis not present

## 2022-09-26 ENCOUNTER — Telehealth: Payer: Self-pay | Admitting: Student

## 2022-09-26 NOTE — Telephone Encounter (Signed)
Pt has this on 10/05

## 2022-09-26 NOTE — Telephone Encounter (Signed)
Attempted to call, sent my chart message with results of recent CT Chest.

## 2022-10-03 ENCOUNTER — Ambulatory Visit (INDEPENDENT_AMBULATORY_CARE_PROVIDER_SITE_OTHER): Payer: Medicare Other | Admitting: Pulmonary Disease

## 2022-10-03 ENCOUNTER — Encounter: Payer: Self-pay | Admitting: Pulmonary Disease

## 2022-10-03 VITALS — BP 120/62 | HR 68 | Temp 98.5°F | Ht 61.0 in | Wt 130.0 lb

## 2022-10-03 DIAGNOSIS — R053 Chronic cough: Secondary | ICD-10-CM

## 2022-10-03 DIAGNOSIS — Z853 Personal history of malignant neoplasm of breast: Secondary | ICD-10-CM | POA: Diagnosis not present

## 2022-10-03 DIAGNOSIS — R911 Solitary pulmonary nodule: Secondary | ICD-10-CM

## 2022-10-03 MED ORDER — HYDROCODONE BIT-HOMATROP MBR 5-1.5 MG/5ML PO SOLN: 5.0000 mL | Freq: Four times a day (QID) | ORAL | 0 refills | Status: DC | PRN

## 2022-10-03 NOTE — Progress Notes (Signed)
Synopsis: Referred in multiple lung nodules, march 2023 by Susy Frizzle, MD  Subjective:   PATIENT ID: Monique [name] GENDER: female DOB: [birth year], MRN: [id number]  Chief Complaint  Patient presents with   Follow-up    Pt is here to discuss results of recent CT.      This is a 66 year old female, past medical history of left breast cancer in 2018 status post mastectomy followed by antiestrogen oral therapy.  She was only able to tolerate for this for short period and so she stopped.She also has been a longtime smoker.  She was enrolled in lung cancer screening CT.  She had this completed a few weeks ago in February.  This revealed a possible obstruction in the anterior segment of the right upper lobe with associated postobstructive atelectasis.  Additionally there was other small nodules in the lung.  She attempted to get a contrasted CT but they were unable to get the IV for contrast and this leaked into the arm.  So she was rescheduled.  She was referred here for evaluation of bilateral pulmonary nodules and abnormal lung cancer screening CT.  OV 04/27/2022: Here today for follow-up after recent nuclear medicine pet imaging.  This shows a hypermetabolic uptake within something ranging off of the right hilum.  There is a SUV max of 6.  Concern for underlying malignancy.  Decision made today for bronchoscopy.  We reviewed all of her PET scan images today in the office.  OV 10/03/2022: Patient seen today for follow-up bronchoscopy and follow-up CT.  There was concern for underlying malignancy tissue biopsies of the left bronchoscopy were negative nothing found within the hilum.  Nodule within the right lower lobe stable but still concerning for metastasis.  Decision made today for repeat bronchoscopy.  She still has ongoing cough.  Has been using over-the-counter cough medications not helping much.  She still has atelectasis in the right middle lobe.    Oncology History  Malignant  neoplasm of upper-outer quadrant of left breast in female, estrogen receptor positive ([place])  05/09/2017 Mammogram   Diagnostic Mammogram 05/09/17 IMPRESSION: 1. 2.9 x 2.8 x 1.8 cm mass with imaging features highly suspicious for malignancy in the 12:30 o'clock position of the left breast 2 cm from the nipple. 2. Left axillary lymph node measuring up to 4.2 mm in maximum thicknes with mild, lobulated and slightly irregular cortical thickening. This is suspicious for a metastatic node.    05/12/2017 Initial Diagnosis   Palpable left breast mass with nipple inversion, mammogram revealed mass at 12:30 position 2.9 cm with suspicious lymph node in axilla. 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  The Hereditary Gene Panel offered by [facility] includes sequencing and/or deletion duplication testing of the following 46 genes: APC, ATM, AXIN2, BARD1, BMPR1A, BRCA1, BRCA2, BRIP1, CDH1, CDKN2A (p14ARF), CDKN2A (p16INK4a), CHEK2, CTNNA1, DICER1, EPCAM (Deletion/duplication testing only), GREM1 (promoter region deletion/duplication testing only), KIT, MEN1, MLH1, MSH2, MSH3, MSH6, MUTYH, NBN, NF1, NHTL1, PALB2, PDGFRA, PMS2, POLD1, POLE, PTEN, RAD50, RAD51C, RAD51D, SDHB, SDHC, SDHD, SMAD4, SMARCA4. STK11, TP53, TSC1, TSC2, and VHL.  The following genes were evaluated for sequence changes only: SDHA and HOXB13 c.251G>A variant only.  The report date is June 05, 2017.    08/03/2017 Surgery   Lt Mastectomy: IDC grade 2; 3.2 cm, margins Neg, ER 100%, PR 95%, Ki 67 15% Her 2 Neg Ratio 1.68; 0/2 LN Neg, T2N0 Stage 1A   12/06/2017 -  Anti-estrogen oral therapy   Tamoxifen 20 mg once daily to start 12/06/17 for 5 years.   -She was previously on Anastroole  for 3 weeks in 05/2017. Stopped due to poor toleration from joint pain.       Past Medical History:  Diagnosis Date   Abnormal EKG    Anxiety    Phreesia 11/29/2020   Arthritis    Phreesia 11/29/2020   Barrett's esophagus    Breast cancer ([facility]) 2018   Left breast   Cancer ([facility])    Phreesia 11/29/2020   Complication of anesthesia    Depression    Family history of breast cancer    Family history of prostate cancer    Family history of thyroid cancer    Fatigue    Generalized headaches    Hepatitis    IBS (irritable bowel syndrome)    Meniere disease    Osteopenia    PONV (postoperative nausea and vomiting)    Psoriasis    Tinnitus    left ear     Family History  Problem Relation Age of Onset   Hypertension Mother    Heart attack Father    Hypertension Father    Healthy Maternal Aunt    Healthy Maternal Uncle    Breast cancer Paternal Aunt 71       bilateral   Healthy Paternal Aunt    Prostate cancer Paternal Uncle    Thyroid cancer Paternal Uncle    Healthy Paternal Uncle    Breast cancer Cousin 73       paternal first cousin   Breast cancer Cousin        paternal first cousin   Colon cancer Neg Hx    Esophageal cancer Neg Hx    Rectal cancer Neg Hx    Stomach cancer Neg Hx      Past Surgical History:  Procedure Laterality Date   BREAST SURGERY N/A    Phreesia 11/29/2020   DILATION AND CURETTAGE OF UTERUS     FINE NEEDLE ASPIRATION  05/10/2022   Procedure: FINE NEEDLE ASPIRATION (FNA) LINEAR;  Surgeon: Josephine Igo, DO;  Location: [facility];  Service: Pulmonary;;   MASTECTOMY Left 2018   MASTECTOMY W/ SENTINEL NODE BIOPSY Left 08/03/2017   Procedure: LEFT MASTECTOMY WITH LEFT AXILLARY SENTINEL LYMPH NODE BIOPSY;  Surgeon: Ovidio Kin, MD;  Location: [facility];  Service: General;  Laterality: Left;   skin cancer removal     TONSILLECTOMY     VIDEO BRONCHOSCOPY WITH ENDOBRONCHIAL ULTRASOUND N/A 05/10/2022   Procedure: VIDEO  BRONCHOSCOPY WITH ENDOBRONCHIAL ULTRASOUND;  Surgeon: Josephine Igo, DO;  Location: [facility];  Service: Pulmonary;  Laterality: N/A;    Social History   Socioeconomic History   Marital status: Married    Spouse name: Not on file   Number of children: Not on file   Years of education: Not on file   Highest education level: Not on file  Occupational History   Not on file  Tobacco Use   Smoking status: Former    Packs/day: 0.50    Types: Cigarettes    Quit date: 2018    Years since quitting: 5.7    Passive exposure: Yes   Smokeless tobacco: Never  Vaping Use   Vaping Use: Never used  Substance and Sexual Activity   Alcohol use: Yes    Comment: occas   Drug use: Never   Sexual activity: Not Currently    Birth control/protection: Post-menopausal  Other Topics Concern   Not on file  Social History Narrative   Not on file   Social Determinants of Health   Financial Resource Strain: Low Risk  (01/11/2022)   Overall Financial Resource Strain (CARDIA)    Difficulty of Paying Living Expenses: Not very hard  Food Insecurity: Food Insecurity Present (01/11/2022)   Hunger Vital Sign    Worried About Running Out of Food in the Last Year: Sometimes true    Ran Out of Food in the Last Year: Never true  Transportation Needs: No Transportation Needs (01/11/2022)   PRAPARE - Hydrologist (Medical): No    Lack of Transportation (Non-Medical): No  Physical Activity: Sufficiently Active (01/11/2022)   Exercise Vital Sign    Days of Exercise per Week: 5 days    Minutes of Exercise per Session: 30 min  Stress: Stress Concern Present (01/11/2022)   [place]    Feeling of Stress : Rather much  Social Connections: Moderately Isolated (01/11/2022)   Social Connection and Isolation Panel [NHANES]    Frequency of Communication with Friends and Family: More than three times a week    Frequency of  Social Gatherings with Friends and Family: Once a week    Attends Religious Services: Never    Marine scientist or Organizations: No    Attends Archivist Meetings: Never    Marital Status: Married  Human resources officer Violence: Not At Risk (01/11/2022)   Humiliation, Afraid, Rape, and Kick questionnaire    Fear of Current or Ex-Partner: No    Emotionally Abused: No    Physically Abused: No    Sexually Abused: No     Allergies  Allergen Reactions   Chantix [Varenicline]     Hallucination   Codeine Nausea And Vomiting   Wellbutrin [Bupropion] Other (See Comments)    Hallucinations       Outpatient Medications Prior to Visit  Medication Sig Dispense Refill   aspirin EC 81 MG tablet Take 1 tablet (81 mg total) by mouth daily. Swallow whole. 90 tablet 3   diazepam (VALIUM) 10 MG tablet Take 1 tablet (10 mg total) by mouth every 12 (twelve) hours as needed for anxiety. 30 tablet 0   rosuvastatin (CRESTOR) 10 MG tablet Take 1 tablet (10 mg total) by mouth daily. 90 tablet 3   neomycin-polymyxin-hydrocortisone (CORTISPORIN) OTIC solution Place 3 drops into the left ear 4 (four) times daily. 10 mL 0   Facility-Administered Medications Prior to Visit  Medication Dose Route Frequency Provider Last Rate Last Admin   0.9 %  sodium chloride infusion  500 mL Intravenous Once Daryel November, MD        Review of Systems  Constitutional:  Negative for chills, fever, malaise/fatigue and weight loss.  HENT:  Negative for hearing loss, sore throat and tinnitus.   Eyes:  Negative for blurred vision and double vision.  Respiratory:  Positive for cough and shortness of breath. Negative for hemoptysis, sputum production, wheezing and stridor.   Cardiovascular:  Negative for chest pain, palpitations, orthopnea, leg swelling and PND.  Gastrointestinal:  Negative for abdominal pain, constipation, diarrhea, heartburn, nausea and vomiting.  Genitourinary:  Negative for dysuria,  hematuria and urgency.  Musculoskeletal:  Negative for joint pain and myalgias.  Skin:  Negative for itching and rash.  Neurological:  Negative for dizziness, tingling, weakness and headaches.  Endo/Heme/Allergies:  Negative for environmental allergies. Does not bruise/bleed easily.  Psychiatric/Behavioral:  Negative for depression. The patient is not nervous/anxious and does not have insomnia.   All other systems reviewed and are negative.    Objective:  Physical Exam Vitals reviewed.  Constitutional:      General: She is not in acute distress.    Appearance: She is well-developed.  HENT:     Head: Normocephalic and atraumatic.  Eyes:     General: No scleral icterus.    Conjunctiva/sclera: Conjunctivae normal.     Pupils: Pupils are equal, round, and reactive to light.  Neck:     Vascular: No JVD.     Trachea: No tracheal deviation.  Cardiovascular:     Rate and Rhythm: Normal rate and regular rhythm.     Heart sounds: Normal heart sounds. No murmur heard. Pulmonary:     Effort: Pulmonary effort is normal. No tachypnea, accessory muscle usage or respiratory distress.     Breath sounds: No stridor. No wheezing, rhonchi or rales.  Abdominal:     General: There is no distension.     Palpations: Abdomen is soft.     Tenderness: There is no abdominal tenderness.  Musculoskeletal:        General: No tenderness.     Cervical back: Neck supple.  Lymphadenopathy:     Cervical: No cervical adenopathy.  Skin:    General: Skin is warm and dry.     Capillary Refill: Capillary refill takes less than 2 seconds.     Findings: No rash.  Neurological:     Mental Status: She is alert and oriented to person, place, and time.  Psychiatric:        Behavior: Behavior normal.      Vitals:   10/03/22 1533  BP: 120/62  Pulse: 68  Temp: 98.5 F (36.9 C)  TempSrc: Oral  SpO2: 97%  Weight: 130 lb (59 kg)  Height: $Remove'5\' 1"'DyIKmvl$  (1.549 m)   97% on RA BMI Readings from Last 3 Encounters:   10/03/22 24.56 kg/m  08/30/22 23.73 kg/m  06/29/22 22.86 kg/m   Wt Readings from Last 3 Encounters:  10/03/22 130 lb (59 kg)  08/30/22 125 lb 9.6 oz (57 kg)  06/29/22 121 lb (54.9 kg)     CBC    Component Value Date/Time   WBC 6.2 05/10/2022 0918   RBC 4.91 05/10/2022 0918   HGB 14.0 05/10/2022 0918   HGB 14.1 05/24/2017 0825   HCT 42.8 05/10/2022 0918   HCT 41.6 05/24/2017 0825   PLT 325 05/10/2022 0918   PLT 272 05/24/2017 0825   MCV 87.2 05/10/2022 0918   MCV 87.5 05/24/2017 0825   MCH 28.5 05/10/2022 0918   MCHC 32.7 05/10/2022 0918   RDW 12.4 05/10/2022 0918   RDW 13.1 05/24/2017 0825   LYMPHSABS 2,451 12/02/2021 0859   LYMPHSABS 2.6 05/24/2017 0825   MONOABS 0.4 05/24/2017 0825   EOSABS 391 12/02/2021 0859   EOSABS 0.3 05/24/2017 0825   BASOSABS 50 12/02/2021 0859   BASOSABS 0.1 05/24/2017 0825     Chest Imaging: February 2023 lung cancer screening CT: Patient was found to have a fullness in the right upper perihilar region with obstruction of the anterior segment of the right upper lobe with atelectasis.  Additionally has other  small pulmonary nodules largest at 12 mm in the right lower lobe. The patient's images have been independently reviewed by me.    September 22, 2022 CT chest: Progressive atelectasis in the right middle lobe, progressive lesion within the right hilum, nodule in the right lower lobe that is stable 1 that is slightly bigger. The patient's images have been independently reviewed by me.    Pulmonary Functions Testing Results:     No data to display          FeNO:   Pathology:   Echocardiogram:   Heart Catheterization:     Assessment & Plan:     ICD-10-CM   1. Lung nodule  R91.1 HYDROcodone bit-homatropine (HYCODAN) 5-1.5 MG/5ML syrup    Ambulatory referral to Pulmonology    2. History of breast cancer  Z85.3     3. Chronic cough  R05.3       Discussion:  This is a 66 year old female multiple pulmonary nodules  abnormal lung cancer screening CT, abnormal PET scan, follow-up imaging with progressive right middle lobe atelectasis, also right lower lobe pulmonary nodule stable in size but still concerning it is spiculated.  Plan: Today we talked about all the various options. I had a think that we should proceed with a repeat bronchoscopy. Even though her last bronchoscopy was negative I think we need to consider resampling anything that we can find within the hilum that was hypermetabolic on previous PET scan, any visible nodes as well as going after this small nodule within the right lower lobe that is most concerning.  Last time we just did videobronchoscope endobronchial ultrasound but this thing I think we should combine her case with robotic assisted navigation to the small nodule and then consider resampling to see if we can confirm whether or not she has ongoing or new malignancy. Patient is agreeable to this plan.  Tentative bronchoscopy date will be on 10/18/2022.    Current Outpatient Medications:    aspirin EC 81 MG tablet, Take 1 tablet (81 mg total) by mouth daily. Swallow whole., Disp: 90 tablet, Rfl: 3   diazepam (VALIUM) 10 MG tablet, Take 1 tablet (10 mg total) by mouth every 12 (twelve) hours as needed for anxiety., Disp: 30 tablet, Rfl: 0   HYDROcodone bit-homatropine (HYCODAN) 5-1.5 MG/5ML syrup, Take 5 mLs by mouth every 6 (six) hours as needed for cough., Disp: 240 mL, Rfl: 0   rosuvastatin (CRESTOR) 10 MG tablet, Take 1 tablet (10 mg total) by mouth daily., Disp: 90 tablet, Rfl: 3  Current Facility-Administered Medications:    0.9 %  sodium chloride infusion, 500 mL, Intravenous, Once, Candis Schatz, Gladstone Pih, MD  I spent 43 minutes dedicated to the care of this patient on the date of this encounter to include pre-visit review of records, face-to-face time with the patient discussing conditions above, post visit ordering of testing, clinical documentation with the electronic health  record, making appropriate referrals as documented, and communicating necessary findings to members of the patients care team.     Garner Nash, DO [facility] 10/03/2022 4:29 PM

## 2022-10-03 NOTE — Patient Instructions (Addendum)
Thank you for visiting Dr. Valeta Harms at [facility]. Today we recommend the following:  Orders Placed This Encounter  Procedures   Procedural/ Surgical Case Request: ROBOTIC ASSISTED NAVIGATIONAL BRONCHOSCOPY, VIDEO BRONCHOSCOPY WITH ENDOBRONCHIAL ULTRASOUND   Ambulatory referral to Pulmonology   Meds ordered this encounter  Medications   HYDROcodone bit-homatropine (HYCODAN) 5-1.5 MG/5ML syrup    Sig: Take 5 mLs by mouth every 6 (six) hours as needed for cough.    Dispense:  240 mL    Refill:  0   Return in about 22 days (around 10/25/2022) for with Eric Form, NP.    Please do your part to reduce the spread of COVID-19.

## 2022-10-07 ENCOUNTER — Ambulatory Visit (INDEPENDENT_AMBULATORY_CARE_PROVIDER_SITE_OTHER): Payer: Medicare Other | Admitting: Family Medicine

## 2022-10-07 VITALS — BP 120/62 | HR 82 | Temp 98.4°F | Ht 61.0 in | Wt 126.4 lb

## 2022-10-07 DIAGNOSIS — J329 Chronic sinusitis, unspecified: Secondary | ICD-10-CM

## 2022-10-07 MED ORDER — AMOXICILLIN 875 MG PO TABS: 875.0000 mg | ORAL_TABLET | Freq: Two times a day (BID) | ORAL | 0 refills | Status: AC

## 2022-10-07 NOTE — Progress Notes (Signed)
Subjective:    Patient ID: Monique [name], female    DOB: [birth year], 66 y.o.   MRN: [id number]  HPI Both the patient and her husband developed a cold about 7 to 8 days ago.  The patient continues to have head congestion.  She started to have pain in her frontal sinuses bilaterally.  She is tender to percussion in her frontal sinuses bilaterally today.  She reports postnasal drip, headaches, constant pressure, and cough.  She states that the drainage is causing her to cough all throughout the night.  She has been taking hydrocodone containing cough syrup, Mucinex, without any relief.  She denies any chest pain or shortness of breath Past Medical History:  Diagnosis Date   Abnormal EKG    Anxiety    Phreesia 11/29/2020   Arthritis    Phreesia 11/29/2020   Barrett's esophagus    Breast cancer ([place]) 2018   Left breast   Cancer ([place])    Phreesia 26/94/8546   Complication of anesthesia    Depression    Family history of breast cancer    Family history of prostate cancer    Family history of thyroid cancer    Fatigue    Generalized headaches    Hepatitis    IBS (irritable bowel syndrome)    Meniere disease    Osteopenia    PONV (postoperative nausea and vomiting)    Psoriasis    Tinnitus    left ear   Past Surgical History:  Procedure Laterality Date   BREAST SURGERY N/A    Phreesia 11/29/2020   DILATION AND CURETTAGE OF UTERUS     FINE NEEDLE ASPIRATION  05/10/2022   Procedure: FINE NEEDLE ASPIRATION (FNA) LINEAR;  Surgeon: Garner Nash, DO;  Location: [facility];  Service: Pulmonary;;   MASTECTOMY Left 2018   MASTECTOMY W/ SENTINEL NODE BIOPSY Left 08/03/2017   Procedure: LEFT MASTECTOMY WITH LEFT AXILLARY SENTINEL LYMPH NODE BIOPSY;  Surgeon: Alphonsa Overall, MD;  Location: [facility];  Service: General;  Laterality: Left;   skin cancer removal     TONSILLECTOMY     VIDEO BRONCHOSCOPY WITH ENDOBRONCHIAL ULTRASOUND N/A 05/10/2022   Procedure: VIDEO  BRONCHOSCOPY WITH ENDOBRONCHIAL ULTRASOUND;  Surgeon: Garner Nash, DO;  Location: [place] [facility];  Service: Pulmonary;  Laterality: N/A;   Current Outpatient Medications on File Prior to Visit  Medication Sig Dispense Refill   aspirin EC 81 MG tablet Take 1 tablet (81 mg total) by mouth daily. Swallow whole. 90 tablet 3   diazepam (VALIUM) 10 MG tablet Take 1 tablet (10 mg total) by mouth every 12 (twelve) hours as needed for anxiety. 30 tablet 0   HYDROcodone bit-homatropine (HYCODAN) 5-1.5 MG/5ML syrup Take 5 mLs by mouth every 6 (six) hours as needed for cough. 240 mL 0   rosuvastatin (CRESTOR) 10 MG tablet Take 1 tablet (10 mg total) by mouth daily. 90 tablet 3   Current Facility-Administered Medications on File Prior to Visit  Medication Dose Route Frequency Provider Last Rate Last Admin   0.9 %  sodium chloride infusion  500 mL Intravenous Once Daryel November, MD       Allergies  Allergen Reactions   Chantix [Varenicline]     Hallucination   Codeine Nausea And Vomiting   Wellbutrin [Bupropion] Other (See Comments)    Hallucinations     Social History   Socioeconomic History   Marital status: Married    Spouse name: Not on file  Number of children: Not on file   Years of education: Not on file   Highest education level: Not on file  Occupational History   Not on file  Tobacco Use   Smoking status: Former    Packs/day: 0.50    Types: Cigarettes    Quit date: 2018    Years since quitting: 5.8    Passive exposure: Yes   Smokeless tobacco: Never  Vaping Use   Vaping Use: Never used  Substance and Sexual Activity   Alcohol use: Yes    Comment: occas   Drug use: Never   Sexual activity: Not Currently    Birth control/protection: Post-menopausal  Other Topics Concern   Not on file  Social History Narrative   Not on file   Social Determinants of Health   Financial Resource Strain: Low Risk  (01/11/2022)   Overall Financial Resource Strain (CARDIA)     Difficulty of Paying Living Expenses: Not very hard  Food Insecurity: Food Insecurity Present (01/11/2022)   Hunger Vital Sign    Worried About Running Out of Food in the Last Year: Sometimes true    Ran Out of Food in the Last Year: Never true  Transportation Needs: No Transportation Needs (01/11/2022)   PRAPARE - Hydrologist (Medical): No    Lack of Transportation (Non-Medical): No  Physical Activity: Sufficiently Active (01/11/2022)   Exercise Vital Sign    Days of Exercise per Week: 5 days    Minutes of Exercise per Session: 30 min  Stress: Stress Concern Present (01/11/2022)   [employer]    Feeling of Stress : Rather much  Social Connections: Moderately Isolated (01/11/2022)   Social Connection and Isolation Panel [NHANES]    Frequency of Communication with Friends and Family: More than three times a week    Frequency of Social Gatherings with Friends and Family: Once a week    Attends Religious Services: Never    Marine scientist or Organizations: No    Attends Archivist Meetings: Never    Marital Status: Married  Human resources officer Violence: Not At Risk (01/11/2022)   Humiliation, Afraid, Rape, and Kick questionnaire    Fear of Current or Ex-Partner: No    Emotionally Abused: No    Physically Abused: No    Sexually Abused: No      Review of Systems  All other systems reviewed and are negative.      Objective:   Physical Exam Vitals reviewed.  Constitutional:      General: She is not in acute distress.    Appearance: She is well-developed. She is not diaphoretic.  HENT:     Head: Normocephalic and atraumatic.     Right Ear: Tympanic membrane, ear canal and external ear normal.     Left Ear: Tympanic membrane, ear canal and external ear normal.     Nose: Congestion and rhinorrhea present.     Right Sinus: Frontal sinus tenderness present.     Left Sinus:  Frontal sinus tenderness present.     Mouth/Throat:     Pharynx: No oropharyngeal exudate.  Eyes:     General: No scleral icterus.       Right eye: No discharge.        Left eye: No discharge.     Conjunctiva/sclera: Conjunctivae normal.     Pupils: Pupils are equal, round, and reactive to light.  Neck:  Thyroid: No thyromegaly.     Vascular: No JVD.     Trachea: No tracheal deviation.  Cardiovascular:     Rate and Rhythm: Regular rhythm.     Heart sounds: Normal heart sounds. No murmur heard.    No friction rub. No gallop.  Pulmonary:     Effort: Pulmonary effort is normal. No respiratory distress.     Breath sounds: Normal breath sounds. No stridor. No wheezing or rales.  Chest:     Chest wall: No tenderness.  Abdominal:     General: Bowel sounds are normal. There is no distension.     Palpations: Abdomen is soft. There is no mass.     Tenderness: There is no abdominal tenderness. There is no guarding or rebound.     Hernia: No hernia is present.  Neurological:     Mental Status: She is alert and oriented to person, place, and time.     Cranial Nerves: No cranial nerve deficit.     Motor: No abnormal muscle tone.     Coordination: Coordination normal.     Deep Tendon Reflexes: Reflexes normal.  Psychiatric:        Behavior: Behavior normal.        Thought Content: Thought content normal.        Judgment: Judgment normal.           Assessment & Plan:  Rhinosinusitis I believe the patient is developing a secondary sinus infection after having an viral upper respiratory infection for the previous 7 to 8 days.  Begin amoxicillin 875 mg twice daily for 10 days recheck next week if no better or sooner if worse

## 2022-10-12 ENCOUNTER — Telehealth: Payer: Self-pay | Admitting: Pulmonary Disease

## 2022-10-12 NOTE — Telephone Encounter (Signed)
Left message for patient to call back  

## 2022-10-13 ENCOUNTER — Encounter: Payer: Self-pay | Admitting: Pulmonary Disease

## 2022-10-13 NOTE — Progress Notes (Signed)
Pt called to cancel case. Per patient she isn't feeling well enough to get her procedure done on 10/31 and wouldn't be able to get her COVID test tomorrow. Patient understands that in order to reschedule she will have to go thru the pulmonary office.

## 2022-10-18 ENCOUNTER — Other Ambulatory Visit: Payer: Self-pay | Admitting: Family Medicine

## 2022-10-18 ENCOUNTER — Ambulatory Visit (HOSPITAL_COMMUNITY): Admission: RE | Admit: 2022-10-18 | Payer: Medicare Other | Source: Ambulatory Visit | Admitting: Pulmonary Disease

## 2022-10-18 ENCOUNTER — Telehealth: Payer: Self-pay

## 2022-10-18 ENCOUNTER — Encounter (HOSPITAL_COMMUNITY): Admission: RE | Payer: Self-pay | Source: Ambulatory Visit

## 2022-10-18 DIAGNOSIS — R911 Solitary pulmonary nodule: Secondary | ICD-10-CM | POA: Insufficient documentation

## 2022-10-18 DIAGNOSIS — Z853 Personal history of malignant neoplasm of breast: Secondary | ICD-10-CM

## 2022-10-18 DIAGNOSIS — R059 Cough, unspecified: Secondary | ICD-10-CM | POA: Insufficient documentation

## 2022-10-18 SURGERY — BRONCHOSCOPY, WITH BIOPSY USING ELECTROMAGNETIC NAVIGATION
Anesthesia: General | Laterality: Right

## 2022-10-18 MED ORDER — DIAZEPAM 10 MG PO TABS: 10.0000 mg | ORAL_TABLET | Freq: Two times a day (BID) | ORAL | 0 refills | Status: DC | PRN

## 2022-10-18 NOTE — Telephone Encounter (Signed)
  Prescription Request  10/18/2022  Is this a "Controlled Substance" medicine? Yes  LOV: 10/07/2022   What is the name of the medication or equipment? diazepam (VALIUM) 10 MG table  Have you contacted your pharmacy to request a refill? Yes   Which pharmacy would you like this sent to?  [facility] [id number] [place] [address] [place] [address] Phone: [phone] Fax: [phone]   Patient notified that their request is being sent to the clinical staff for review and that they should receive a response within 2 business days.   Please advise at [phone]

## 2022-10-19 NOTE — Telephone Encounter (Signed)
Called and spoke with patient. She stated that her cough has since improved since she last called. Denied any increased SOB or wheezing currently.   While on the phone, she mentioned that her bronch had been rescheduled to 11/14. I went ahead and cancelled the follow up that was scheduled for 11/7 with Judson Roch. I attempted to get her rescheduled but the soonest appt for both BI and Judson Roch wasn't until the first week of December. She does not want to wait that long for results.   Dr. Valeta Harms, can you please advise? Thanks!

## 2022-10-19 NOTE — Telephone Encounter (Signed)
[name], Monique Graves, DO  [name], Monique M, CMA; P Lbpu Triage Pool Caller: Unspecified (1 week ago,  2:15 PM)  Please keep appt as scheduled with SG.  We will call her with results.  Please keep bronchoscopy appt.   She is ok to have bronch on 14th correct?   Thanks   BLI              Spoke with pt and let her know Dr. Valeta Harms stated he would call her the results of the 11/14 bronch. 11/7 OV was canceled and no other opening are available on 11/7.  Pt scheduled for OV with Eric Form on 11/18/22 at 9:30 am> nothing further needed at this time.

## 2022-10-25 ENCOUNTER — Ambulatory Visit: Payer: Medicare Other | Admitting: Acute Care

## 2022-10-27 DIAGNOSIS — H02831 Dermatochalasis of right upper eyelid: Secondary | ICD-10-CM | POA: Diagnosis not present

## 2022-10-27 DIAGNOSIS — H57813 Brow ptosis, bilateral: Secondary | ICD-10-CM | POA: Diagnosis not present

## 2022-10-27 DIAGNOSIS — H02834 Dermatochalasis of left upper eyelid: Secondary | ICD-10-CM | POA: Diagnosis not present

## 2022-10-27 DIAGNOSIS — H0279 Other degenerative disorders of eyelid and periocular area: Secondary | ICD-10-CM | POA: Diagnosis not present

## 2022-10-27 DIAGNOSIS — H02413 Mechanical ptosis of bilateral eyelids: Secondary | ICD-10-CM | POA: Diagnosis not present

## 2022-10-27 DIAGNOSIS — H53483 Generalized contraction of visual field, bilateral: Secondary | ICD-10-CM | POA: Diagnosis not present

## 2022-10-28 ENCOUNTER — Other Ambulatory Visit: Payer: Medicare Other

## 2022-10-28 ENCOUNTER — Other Ambulatory Visit: Payer: Self-pay | Admitting: *Deleted

## 2022-10-28 DIAGNOSIS — Z01818 Encounter for other preprocedural examination: Secondary | ICD-10-CM

## 2022-10-29 ENCOUNTER — Encounter: Payer: Self-pay | Admitting: Pulmonary Disease

## 2022-10-30 ENCOUNTER — Telehealth: Payer: Self-pay | Admitting: Student

## 2022-10-30 DIAGNOSIS — R918 Other nonspecific abnormal finding of lung field: Secondary | ICD-10-CM

## 2022-10-30 LAB — SPECIMEN STATUS REPORT

## 2022-10-30 LAB — NOVEL CORONAVIRUS, NAA: SARS-CoV-2, NAA: NOT DETECTED

## 2022-10-30 NOTE — Telephone Encounter (Signed)
Saw her message about wishing to cancel bronchoscopy due to change in provider. 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 She was more than understanding. She said she was actually reluctant to undergo the procedure now anyway and instead would prefer surveillance with another PET/CT which she thinks her insurance will cover this year. She says she's also just getting over a nasty sinus infection. She does want to cancel the procedure on Tuesday and she'll discuss PET/CT with Dr. Valeta Harms and Eric Form.   [place]

## 2022-10-31 DIAGNOSIS — H53483 Generalized contraction of visual field, bilateral: Secondary | ICD-10-CM | POA: Diagnosis not present

## 2022-11-01 ENCOUNTER — Ambulatory Visit (HOSPITAL_COMMUNITY): Admission: RE | Admit: 2022-11-01 | Payer: Medicare Other | Source: Ambulatory Visit | Admitting: Student

## 2022-11-01 ENCOUNTER — Encounter (HOSPITAL_COMMUNITY): Admission: RE | Payer: Self-pay | Source: Ambulatory Visit

## 2022-11-01 SURGERY — BRONCHOSCOPY, WITH BIOPSY USING ELECTROMAGNETIC NAVIGATION
Anesthesia: General | Laterality: Right

## 2022-11-01 NOTE — Telephone Encounter (Signed)
I would follow with 3 month PET/CT - I've ordered it. I would probably postpone clinic follow up until after this too.   Can we reschedule her with Eric Form or Dr. Valeta Harms on after 12/24/21 PET/CT?

## 2022-11-02 NOTE — Telephone Encounter (Signed)
Left message for pt to call back to reschedule appt with Eric Form, NP until after she has the PEt on 12/21/2022.

## 2022-11-04 NOTE — Telephone Encounter (Signed)
Spoke to pt regarding rescheduling upcoming appt with Eric Form, NP on 11/18/22. Pt would like to keep this appt to go over a list of questions that she has. I advised pt that we will keep this appt for her to address her concerns. Pt verbalized understanding.

## 2022-11-17 ENCOUNTER — Other Ambulatory Visit (HOSPITAL_COMMUNITY): Payer: Medicare Other

## 2022-11-18 ENCOUNTER — Telehealth: Payer: Self-pay | Admitting: Acute Care

## 2022-11-18 ENCOUNTER — Ambulatory Visit (INDEPENDENT_AMBULATORY_CARE_PROVIDER_SITE_OTHER): Payer: Medicare Other | Admitting: Acute Care

## 2022-11-18 ENCOUNTER — Encounter: Payer: Self-pay | Admitting: Acute Care

## 2022-11-18 VITALS — BP 122/68 | HR 55 | Temp 98.2°F | Ht 61.0 in | Wt 131.0 lb

## 2022-11-18 DIAGNOSIS — Z720 Tobacco use: Secondary | ICD-10-CM

## 2022-11-18 DIAGNOSIS — R911 Solitary pulmonary nodule: Secondary | ICD-10-CM | POA: Diagnosis not present

## 2022-11-18 NOTE — Progress Notes (Signed)
History of Present Illness Monique [name] is a 66 y.o. female former smoker ( Quit 2018 with a 50+   pack year smoking history with  history left of breast cancer in 2018, SP mastectomy, followed by antiestrogen oral therapy which she stopped after a short time due to intolerence. She was referred to [facility]  for evaluation of bilateral pulmonary nodules and abnormal lung cancer screening CT ordered by her PCP Jenna Luo) . She is followed by Dr. Valeta Harms.     11/18/2022 Pt. Presents for  follow up. She was originally referred to see Dr. Valeta Harms 02/2022 for abnormal LDCT, bilateral pulmonary nodules. Dr. Valeta Harms ordered a PET scan which was completed 5.4.2023.  This showed a hypermetabolic uptake within something ranging off of the right hilum. There was a SUV max of 6. Concern for underlying malignancy. Decision made at that time  for bronchoscopy, which was done 05/18/2022.  Biopsies of the left bronchoscopy were negative and nothing found within the hilum. The Nodule within the right lower lobe was stable but still concerning for metastasis. She saw Dr. Valeta Harms again 10/03/2022 after repeat CT chest 09/22/2022, which showed  suspicion  for progressive malignancy . Right lower lobe pulmonary nodule stable in size but still concerning as it is spiculated . She had ongoing cough, and atelectasis of the right middle lobe. Decision was made to proceed with a repeat bronchoscopy. Even though her last bronchoscopy was negative Dr. Valeta Harms felt the  need to consider resampling anything that we can find within the hilum that was hypermetabolic on previous PET scan, any visible nodes as well as going after this small nodule within the right lower lobe that is most concerning. Plan was for a combined EBUS/ robotic assisted navigation to the small nodule and then consider resampling to see if we can confirm whether or not she has ongoing or new malignancy. The bronchoscopy ws planned for 10/18/2022. Patient  cancelled the procedure as she was sick that day. The procedure was rescheduled for 11/01/2022, but Dr. Valeta Harms was sic. One of Dr. Juline Patch partners offered to do the procedure, and she deferred to have Dr. Valeta Harms do the procedure when he was able.   Pt was seen in the office today . She has multiple questions about the need to undergo another biopsy.She states that she has no symptoms of lung cancer, and wonders if she does not need biopsy. I explained that early stage lung cancer is silent. Patient's do not have symptoms until it has spread, and is harder to treat, and with lower percent  option for cure. I also explained that this is her choice. Our medical recommendation is for  biopsy. She does have an increased risk due to her previous history of breast cancer. We discussed the combination EBUS/ robotic assisted navigation to the small nodule, and how this differs from the procedure she had done in May 2023.  Pt. Has agreed to have the PET scan and Super D PET Dr. Verlee Monte ordered  for 12/21/2022. She deferred on earlier imaging. I am unsure why. We will see her in the office after this imaging and re-evaluate, but I told her it is our medical recommendation she has a repeat biopsy. She denies any pulmonary issues with the exception of some sinus congestion. .    Test Results: 09/22/2022 CT Chest Mildly progressive dense atelectasis in the medial aspect of the right upper lobe extending from the right hilum with progressive central bronchial obstruction in  that area, suspicious for progressive malignancy. 2. Interval 8 x 6 mm nodule in the periphery of the right lower lobe laterally and interval 4 mm right lower lobe nodule slightly more superiorly. These are both within area of minimally progressive patchy interstitial prominence. These could be due to metastatic disease or due to an infectious or inflammatory process. 3. Multiple previously demonstrated smaller bilateral lung nodules have not  changed significantly. The most suspicious of these is spiculated nodule in the right lower lobe measuring 7 x 5 mm. 4. Interval right middle lobe atelectasis and cylindrical bronchiectasis medially.       Latest Ref Rng & Units 05/10/2022    9:18 AM 12/02/2021    8:59 AM 08/28/2020   12:29 PM  CBC  WBC 4.0 - 10.5 K/uL 6.2  6.3  8.0   Hemoglobin 12.0 - 15.0 g/dL 14.0  13.2  13.9   Hematocrit 36.0 - 46.0 % 42.8  40.1  42.0   Platelets 150 - 400 K/uL 325  323  411        Latest Ref Rng & Units 05/10/2022    9:18 AM 12/02/2021    8:59 AM 08/28/2020   12:29 PM  BMP  Glucose 70 - 99 mg/dL 98  89  91   BUN 8 - 23 mg/dL 10  10  8    Creatinine 0.44 - 1.00 mg/dL 0.92  0.84  0.83   BUN/Creat Ratio 6 - 22 (calc)  NOT APPLICABLE  NOT APPLICABLE   Sodium 638 - 145 mmol/L 138  137  136   Potassium 3.5 - 5.1 mmol/L 4.6  4.2  4.6   Chloride 98 - 111 mmol/L 105  99  97   CO2 22 - 32 mmol/L 24  29  28    Calcium 8.9 - 10.3 mg/dL 9.4  9.3  9.7     BNP No results found for: "BNP"  ProBNP No results found for: "PROBNP"  PFT No results found for: "FEV1PRE", "FEV1POST", "FVCPRE", "FVCPOST", "TLC", "DLCOUNC", "PREFEV1FVCRT", "PSTFEV1FVCRT"  No results found.   Past medical hx Past Medical History:  Diagnosis Date   Abnormal EKG    Anxiety    Phreesia 11/29/2020   Arthritis    Phreesia 11/29/2020   Barrett's esophagus    Breast cancer ([place]) 2018   Left breast   Cancer ([place])    Phreesia 46/65/9935   Complication of anesthesia    Depression    Family history of breast cancer    Family history of prostate cancer    Family history of thyroid cancer    Fatigue    Generalized headaches    Hepatitis    IBS (irritable bowel syndrome)    Meniere disease    Osteopenia    PONV (postoperative nausea and vomiting)    Psoriasis    Tinnitus    left ear     Social History   Tobacco Use   Smoking status: Former    Packs/day: 0.50    Types: Cigarettes    Quit date: 2018    Years  since quitting: 5.9    Passive exposure: Yes   Smokeless tobacco: Never  Vaping Use   Vaping Use: Never used  Substance Use Topics   Alcohol use: Yes    Comment: occas   Drug use: Never    Ms.Heckert reports that she quit smoking about 5 years ago. Her smoking use included cigarettes. She smoked an average of .5 packs per day. She has been exposed  to tobacco smoke. She has never used smokeless tobacco. She reports current alcohol use. She reports that she does not use drugs.  Tobacco Cessation: Former smoker with a 50+ pack year smoking history, Quit 2018   Past surgical hx, Family hx, Social hx all reviewed.  Current Outpatient Medications on File Prior to Visit  Medication Sig   APPLE CIDER VINEGAR PO Take 1 capsule by mouth daily.   diazepam (VALIUM) 10 MG tablet Take 1 tablet (10 mg total) by mouth every 12 (twelve) hours as needed for anxiety. (Patient taking differently: Take 5 mg by mouth at bedtime as needed for sleep or anxiety.)   HYDROcodone bit-homatropine (HYCODAN) 5-1.5 MG/5ML syrup Take 5 mLs by mouth every 6 (six) hours as needed for cough.   Multiple Vitamins-Minerals (AIRBORNE PO) Take 1 tablet by mouth daily.   rosuvastatin (CRESTOR) 10 MG tablet Take 1 tablet (10 mg total) by mouth daily.   aspirin EC 81 MG tablet Take 1 tablet (81 mg total) by mouth daily. Swallow whole. (Patient not taking: Reported on 11/18/2022)   Current Facility-Administered Medications on File Prior to Visit  Medication   0.9 %  sodium chloride infusion     Allergies  Allergen Reactions   Chantix [Varenicline]     Hallucination   Codeine Nausea And Vomiting   Wellbutrin [Bupropion] Other (See Comments)    Hallucinations      Review Of Systems:  Constitutional:   No  weight loss, night sweats,  Fevers, chills, fatigue, or  lassitude.  HEENT:   No headaches,  Difficulty swallowing,  Tooth/dental problems, or  Sore throat,                No sneezing, itching, ear ache, + nasal  congestion,+  post nasal drip,   CV:  No chest pain,  Orthopnea, PND, swelling in lower extremities, anasarca, dizziness, palpitations, syncope.   GI  No heartburn, indigestion, abdominal pain, nausea, vomiting, diarrhea, change in bowel habits, loss of appetite, bloody stools.   Resp: No shortness of breath with exertion or at rest.  No excess mucus, no productive cough,  No non-productive cough,  No coughing up of blood.  No change in color of mucus.  No wheezing.  No chest wall deformity  Skin: no rash or lesions.  GU: no dysuria, change in color of urine, no urgency or frequency.  No flank pain, no hematuria   MS:  No joint pain or swelling.  No decreased range of motion.  No back pain.  Psych:  No change in mood or affect. No depression + anxiety.  No memory loss.   Vital Signs BP 122/68 (BP Location: Right Arm, Cuff Size: Normal)   Pulse (!) 55   Temp 98.2 F (36.8 C) (Oral)   Ht 5\' 1"  (1.549 m)   Wt 131 lb (59.4 kg)   SpO2 98%   BMI 24.75 kg/m    Physical Exam:  General- No distress,  A&Ox3, pleasant ENT: No sinus tenderness, TM clear, pale nasal mucosa, no oral exudate,no post nasal drip, no LAN Cardiac: S1, S2, regular rate and rhythm, no murmur Chest: No wheeze/ rales/ dullness; no accessory muscle use, no nasal flaring, no sternal retractions Abd.: Soft Non-tender, ND, BS +, Body mass index is 24.75 kg/m.  Ext: No clubbing cyanosis, edema Neuro:  normal strength, MAE x 4, A&O x 3 Skin: No rashes, warm and dry, No lesions  Psych: Anxious   Assessment/Plan Nodule within the right lower lobe  stable but still concerning for metastasis.  Right Middle lobe atelectasis Cough Hx. Of breast cancer Plan We will get the PET scan and Super D PET scan 12/21/2022 as is scheduled.  PFT as scheduled for 12/26/2021 Follow up with Dr. Valeta Harms or Judson Roch NP after PET and Super D to discuss  repeat bronch procedure in the office. Start your cardiac medication as cardiology  recommended.  Please contact office for sooner follow up if symptoms do not improve or worsen or seek emergency care    I spent 45 minutes dedicated to the care of this patient on the date of this encounter to include pre-visit review of records, face-to-face time with the patient discussing conditions above, post visit ordering of testing, clinical documentation with the electronic health record, making appropriate referrals as documented, and communicating necessary information to the patient's healthcare team.    Magdalen Spatz, NP 11/18/2022  9:35 AM

## 2022-11-18 NOTE — Telephone Encounter (Signed)
She is on Tammy's schedule. Can we see if we can get her on Icard's or my schedule? Thanks

## 2022-11-18 NOTE — Patient Instructions (Addendum)
It is good to see you today. We will get the PET scan and Super D PET scan 12/21/2022 as is scheduled.  Follow up with Dr. Valeta Harms or Judson Roch NP after PET and Super D to discuss bronch procedure in the office. Start your cardiac medication as cardiology recommended.  Please contact office for sooner follow up if symptoms do not improve or worsen or seek emergency care

## 2022-11-18 NOTE — Telephone Encounter (Signed)
Can we get this patient in to see me or Icard 12/26/2021 after her PFT's? My nurse was supposed to do this today, but she didn't schedule her. Thanks so much

## 2022-11-21 NOTE — Telephone Encounter (Signed)
Spoke with pt and changed follow up appt to Eric Form, NP 12/27/22 8:30.

## 2022-11-22 DIAGNOSIS — C50912 Malignant neoplasm of unspecified site of left female breast: Secondary | ICD-10-CM | POA: Diagnosis not present

## 2022-11-28 ENCOUNTER — Other Ambulatory Visit: Payer: Medicare Other

## 2022-12-02 ENCOUNTER — Ambulatory Visit (INDEPENDENT_AMBULATORY_CARE_PROVIDER_SITE_OTHER): Payer: Medicare Other | Admitting: Family Medicine

## 2022-12-02 ENCOUNTER — Encounter: Payer: Self-pay | Admitting: Family Medicine

## 2022-12-02 VITALS — BP 120/62 | HR 66 | Ht 61.0 in | Wt 125.0 lb

## 2022-12-02 DIAGNOSIS — Z853 Personal history of malignant neoplasm of breast: Secondary | ICD-10-CM

## 2022-12-02 DIAGNOSIS — M8580 Other specified disorders of bone density and structure, unspecified site: Secondary | ICD-10-CM | POA: Diagnosis not present

## 2022-12-02 DIAGNOSIS — E7800 Pure hypercholesterolemia, unspecified: Secondary | ICD-10-CM | POA: Diagnosis not present

## 2022-12-02 DIAGNOSIS — Z0000 Encounter for general adult medical examination without abnormal findings: Secondary | ICD-10-CM | POA: Diagnosis not present

## 2022-12-02 DIAGNOSIS — R399 Unspecified symptoms and signs involving the genitourinary system: Secondary | ICD-10-CM

## 2022-12-02 LAB — URINALYSIS, ROUTINE W REFLEX MICROSCOPIC
Bacteria, UA: NONE SEEN /HPF
Bilirubin Urine: NEGATIVE
Glucose, UA: NEGATIVE
Hyaline Cast: NONE SEEN /LPF
Ketones, ur: NEGATIVE
Leukocytes,Ua: NEGATIVE
Nitrite: NEGATIVE
Protein, ur: NEGATIVE
Specific Gravity, Urine: 1.015 (ref 1.001–1.035)
WBC, UA: NONE SEEN /HPF (ref 0–5)
pH: 7 (ref 5.0–8.0)

## 2022-12-02 LAB — MICROSCOPIC MESSAGE

## 2022-12-02 NOTE — Progress Notes (Signed)
Subjective:    Patient ID: Monique [name], female    DOB: 09-Nov-1956, 66 y.o.   MRN: [id number]  HPI Patient is a very pleasant 66 year old Caucasian female here today for complete physical exam.  She has a history of left-sided breast cancer status post left mastectomy.  Her colonoscopy was performed in 2022 and only showed 1 hyperplastic polyp.  Therefore her colonoscopy is up-to-date and is not necessary again for 9 years.  Currently being evaluated for a mass in the lung.  Recently saw pulmonology who recommended:  Get PET scan and Super D PET scan 12/21/2022 as is scheduled.  PFT as scheduled for 12/26/2021 Follow up with Dr. Valeta Harms or Judson Roch NP after PET and Super D to discuss  repeat bronch procedure in the office.  She is overdue for her mammogram of her right breast.  She had a Pap smear last year that was normal.  She did have a drop of blood in her urine this morning.  She denies any dysuria or urgency or frequency.  Urinalysis shows trace blood but negative nitrates and negative leukocyte esterase.  Otherwise she is asymptomatic.  She is not taking her cholesterol medication Past Medical History:  Diagnosis Date   Abnormal EKG    Anxiety    Phreesia 11/29/2020   Arthritis    Phreesia 11/29/2020   Barrett's esophagus    Breast cancer ([place]) 2018   Left breast   Cancer ([place])    Phreesia 74/94/4967   Complication of anesthesia    Depression    Family history of breast cancer    Family history of prostate cancer    Family history of thyroid cancer    Fatigue    Generalized headaches    Hepatitis    IBS (irritable bowel syndrome)    Meniere disease    Osteopenia    PONV (postoperative nausea and vomiting)    Psoriasis    Tinnitus    left ear   Past Surgical History:  Procedure Laterality Date   BREAST SURGERY N/A    Phreesia 11/29/2020   DILATION AND CURETTAGE OF UTERUS     FINE NEEDLE ASPIRATION  05/10/2022   Procedure: FINE NEEDLE ASPIRATION (FNA) LINEAR;  Surgeon:  Garner Nash, DO;  Location: [facility];  Service: Pulmonary;;   MASTECTOMY Left 2018   MASTECTOMY W/ SENTINEL NODE BIOPSY Left 08/03/2017   Procedure: LEFT MASTECTOMY WITH LEFT AXILLARY SENTINEL LYMPH NODE BIOPSY;  Surgeon: Alphonsa Overall, MD;  Location: [facility];  Service: General;  Laterality: Left;   skin cancer removal     TONSILLECTOMY     VIDEO BRONCHOSCOPY WITH ENDOBRONCHIAL ULTRASOUND N/A 05/10/2022   Procedure: VIDEO BRONCHOSCOPY WITH ENDOBRONCHIAL ULTRASOUND;  Surgeon: Garner Nash, DO;  Location: [facility];  Service: Pulmonary;  Laterality: N/A;   Current Outpatient Medications on File Prior to Visit  Medication Sig Dispense Refill   APPLE CIDER VINEGAR PO Take 1 capsule by mouth daily.     aspirin EC 81 MG tablet Take 1 tablet (81 mg total) by mouth daily. Swallow whole. (Patient not taking: Reported on 11/18/2022) 90 tablet 3   diazepam (VALIUM) 10 MG tablet Take 1 tablet (10 mg total) by mouth every 12 (twelve) hours as needed for anxiety. (Patient taking differently: Take 5 mg by mouth at bedtime as needed for sleep or anxiety.) 30 tablet 0   HYDROcodone bit-homatropine (HYCODAN) 5-1.5 MG/5ML syrup Take 5 mLs by mouth every 6 (six) hours as needed  for cough. 240 mL 0   Multiple Vitamins-Minerals (AIRBORNE PO) Take 1 tablet by mouth daily.     rosuvastatin (CRESTOR) 10 MG tablet Take 1 tablet (10 mg total) by mouth daily. 90 tablet 3   Current Facility-Administered Medications on File Prior to Visit  Medication Dose Route Frequency Provider Last Rate Last Admin   0.9 %  sodium chloride infusion  500 mL Intravenous Once Daryel November, MD       Allergies  Allergen Reactions   Chantix [Varenicline]     Hallucination   Codeine Nausea And Vomiting   Wellbutrin [Bupropion] Other (See Comments)    Hallucinations     Social History   Socioeconomic History   Marital status: Married    Spouse name: Not on file   Number of children: Not on file    Years of education: Not on file   Highest education level: Not on file  Occupational History   Not on file  Tobacco Use   Smoking status: Former    Packs/day: 0.50    Types: Cigarettes    Quit date: 2018    Years since quitting: 5.9    Passive exposure: Yes   Smokeless tobacco: Never  Vaping Use   Vaping Use: Never used  Substance and Sexual Activity   Alcohol use: Yes    Comment: occas   Drug use: Never   Sexual activity: Not Currently    Birth control/protection: Post-menopausal  Other Topics Concern   Not on file  Social History Narrative   Not on file   Social Determinants of Health   Financial Resource Strain: Low Risk  (01/11/2022)   Overall Financial Resource Strain (CARDIA)    Difficulty of Paying Living Expenses: Not very hard  Food Insecurity: Food Insecurity Present (01/11/2022)   Hunger Vital Sign    Worried About Running Out of Food in the Last Year: Sometimes true    Ran Out of Food in the Last Year: Never true  Transportation Needs: No Transportation Needs (01/11/2022)   PRAPARE - Hydrologist (Medical): No    Lack of Transportation (Non-Medical): No  Physical Activity: Sufficiently Active (01/11/2022)   Exercise Vital Sign    Days of Exercise per Week: 5 days    Minutes of Exercise per Session: 30 min  Stress: Stress Concern Present (01/11/2022)   [place]    Feeling of Stress : Rather much  Social Connections: Moderately Isolated (01/11/2022)   Social Connection and Isolation Panel [NHANES]    Frequency of Communication with Friends and Family: More than three times a week    Frequency of Social Gatherings with Friends and Family: Once a week    Attends Religious Services: Never    Marine scientist or Organizations: No    Attends Archivist Meetings: Never    Marital Status: Married  Human resources officer Violence: Not At Risk (01/11/2022)    Humiliation, Afraid, Rape, and Kick questionnaire    Fear of Current or Ex-Partner: No    Emotionally Abused: No    Physically Abused: No    Sexually Abused: No      Review of Systems  All other systems reviewed and are negative.      Objective:   Physical Exam Vitals reviewed.  Constitutional:      General: She is not in acute distress.    Appearance: She is well-developed. She is not diaphoretic.  HENT:     Head: Normocephalic and atraumatic.     Right Ear: External ear normal.     Left Ear: External ear normal.     Nose: Nose normal.     Mouth/Throat:     Pharynx: No oropharyngeal exudate.  Eyes:     General: No scleral icterus.       Right eye: No discharge.        Left eye: No discharge.     Conjunctiva/sclera: Conjunctivae normal.     Pupils: Pupils are equal, round, and reactive to light.  Neck:     Thyroid: No thyromegaly.     Vascular: No JVD.     Trachea: No tracheal deviation.  Cardiovascular:     Rate and Rhythm: Regular rhythm.     Heart sounds: Normal heart sounds. No murmur heard.    No friction rub. No gallop.  Pulmonary:     Effort: Pulmonary effort is normal. No respiratory distress.     Breath sounds: Normal breath sounds. No stridor. No wheezing or rales.  Chest:     Chest wall: No tenderness.  Abdominal:     General: Bowel sounds are normal. There is no distension.     Palpations: Abdomen is soft. There is no mass.     Tenderness: There is no abdominal tenderness. There is no guarding or rebound.     Hernia: No hernia is present.  Musculoskeletal:        General: No deformity.     Cervical back: Normal range of motion and neck supple.  Lymphadenopathy:     Cervical: No cervical adenopathy.  Skin:    Coloration: Skin is not pale.     Findings: No erythema or rash.  Neurological:     Mental Status: She is alert and oriented to person, place, and time.     Cranial Nerves: No cranial nerve deficit.     Motor: No abnormal muscle tone.      Coordination: Coordination normal.     Deep Tendon Reflexes: Reflexes normal.  Psychiatric:        Behavior: Behavior normal.        Thought Content: Thought content normal.        Judgment: Judgment normal.           Assessment & Plan:  UTI symptoms - Plan: Urinalysis, Routine w reflex microscopic  Pure hypercholesterolemia - Plan: CBC with Differential/Platelet, COMPLETE METABOLIC PANEL WITH GFR, Lipid panel  General medical exam  Osteopenia, unspecified location  History of breast cancer Obviously my biggest concern is the mass in her lung.  She will follow-up with pulmonology as planned.  She is not smoking and I congratulated her on this.  My second biggest concern is her history of noncoronary artery disease.  I would really like her to be on statin.  I will check a fasting lipid panel and likely switch the patient to Lipitor to see if she will tolerate that better with a goal LDL cholesterol less than 70.  I recommended she schedule her mammogram.  Her Pap smear and colonoscopy are up-to-date.  Her blood pressure is excellent.  She is have 1 episode of microscopic hematuria.  If this persist would recommend urology consultation for cystoscopy

## 2022-12-03 LAB — CBC WITH DIFFERENTIAL/PLATELET
Absolute Monocytes: 359 cells/uL (ref 200–950)
Basophils Absolute: 38 cells/uL (ref 0–200)
Basophils Relative: 0.6 %
Eosinophils Absolute: 353 cells/uL (ref 15–500)
Eosinophils Relative: 5.6 %
HCT: 38.3 % (ref 35.0–45.0)
Hemoglobin: 12.7 g/dL (ref 11.7–15.5)
Lymphs Abs: 2363 cells/uL (ref 850–3900)
MCH: 28 pg (ref 27.0–33.0)
MCHC: 33.2 g/dL (ref 32.0–36.0)
MCV: 84.5 fL (ref 80.0–100.0)
MPV: 10.9 fL (ref 7.5–12.5)
Monocytes Relative: 5.7 %
Neutro Abs: 3188 cells/uL (ref 1500–7800)
Neutrophils Relative %: 50.6 %
Platelets: 297 10*3/uL (ref 140–400)
RBC: 4.53 10*6/uL (ref 3.80–5.10)
RDW: 12 % (ref 11.0–15.0)
Total Lymphocyte: 37.5 %
WBC: 6.3 10*3/uL (ref 3.8–10.8)

## 2022-12-03 LAB — COMPLETE METABOLIC PANEL WITH GFR
AG Ratio: 1.8 (calc) (ref 1.0–2.5)
ALT: 10 U/L (ref 6–29)
AST: 15 U/L (ref 10–35)
Albumin: 4.5 g/dL (ref 3.6–5.1)
Alkaline phosphatase (APISO): 64 U/L (ref 37–153)
BUN: 19 mg/dL (ref 7–25)
CO2: 23 mmol/L (ref 20–32)
Calcium: 9.5 mg/dL (ref 8.6–10.4)
Chloride: 100 mmol/L (ref 98–110)
Creat: 0.83 mg/dL (ref 0.50–1.05)
Globulin: 2.5 g/dL (calc) (ref 1.9–3.7)
Glucose, Bld: 91 mg/dL (ref 65–99)
Potassium: 4.4 mmol/L (ref 3.5–5.3)
Sodium: 137 mmol/L (ref 135–146)
Total Bilirubin: 0.4 mg/dL (ref 0.2–1.2)
Total Protein: 7 g/dL (ref 6.1–8.1)
eGFR: 78 mL/min/{1.73_m2} (ref >=60)

## 2022-12-03 LAB — LIPID PANEL
Cholesterol: 212 mg/dL — ABNORMAL HIGH (ref <200)
HDL: 73 mg/dL (ref >=50)
LDL Cholesterol (Calc): 117 mg/dL (calc) — ABNORMAL HIGH
Non-HDL Cholesterol (Calc): 139 mg/dL (calc) — ABNORMAL HIGH (ref <130)
Total CHOL/HDL Ratio: 2.9 (calc) (ref <5.0)
Triglycerides: 108 mg/dL (ref <150)

## 2022-12-05 ENCOUNTER — Other Ambulatory Visit: Payer: Self-pay

## 2022-12-05 DIAGNOSIS — E7800 Pure hypercholesterolemia, unspecified: Secondary | ICD-10-CM

## 2022-12-05 MED ORDER — ATORVASTATIN CALCIUM 40 MG PO TABS: 40.0000 mg | ORAL_TABLET | Freq: Every day | ORAL | 3 refills | Status: DC

## 2022-12-21 ENCOUNTER — Encounter (HOSPITAL_COMMUNITY)
Admission: RE | Admit: 2022-12-21 | Discharge: 2022-12-21 | Disposition: A | Discharge status: Home / Self Care | Payer: Medicare Other | Source: Ambulatory Visit | Attending: Student | Admitting: Student

## 2022-12-21 DIAGNOSIS — R911 Solitary pulmonary nodule: Secondary | ICD-10-CM | POA: Diagnosis not present

## 2022-12-21 DIAGNOSIS — J9811 Atelectasis: Secondary | ICD-10-CM | POA: Diagnosis not present

## 2022-12-21 DIAGNOSIS — R918 Other nonspecific abnormal finding of lung field: Secondary | ICD-10-CM | POA: Insufficient documentation

## 2022-12-21 LAB — GLUCOSE, CAPILLARY: Glucose-Capillary: 106 mg/dL — ABNORMAL HIGH (ref 70–99)

## 2022-12-21 MED ORDER — FLUDEOXYGLUCOSE F - 18 (FDG) INJECTION
6.2200 | Freq: Once | INTRAVENOUS | Status: AC | PRN
  Administered 2022-12-21: 6.22 via INTRAVENOUS

## 2022-12-22 ENCOUNTER — Other Ambulatory Visit: Payer: Self-pay | Admitting: *Deleted

## 2022-12-22 DIAGNOSIS — R911 Solitary pulmonary nodule: Secondary | ICD-10-CM

## 2022-12-23 ENCOUNTER — Other Ambulatory Visit (HOSPITAL_COMMUNITY): Payer: Medicare Other

## 2022-12-26 ENCOUNTER — Ambulatory Visit: Payer: Medicare Other | Admitting: Adult Health

## 2022-12-26 ENCOUNTER — Ambulatory Visit (INDEPENDENT_AMBULATORY_CARE_PROVIDER_SITE_OTHER): Payer: Medicare Other | Admitting: Pulmonary Disease

## 2022-12-26 DIAGNOSIS — R911 Solitary pulmonary nodule: Secondary | ICD-10-CM

## 2022-12-26 LAB — PULMONARY FUNCTION TEST
DL/VA % pred: 87 %
DL/VA: 3.73 ml/min/mmHg/L
DLCO cor % pred: 80 %
DLCO cor: 14.38 ml/min/mmHg
DLCO unc % pred: 78 %
DLCO unc: 14.06 ml/min/mmHg
FEF 25-75 Post: 1.45 L/sec
FEF 25-75 Pre: 1.09 L/sec
FEF2575-%Change-Post: 32 %
FEF2575-%Pred-Post: 75 %
FEF2575-%Pred-Pre: 57 %
FEV1-%Change-Post: 6 %
FEV1-%Pred-Post: 83 %
FEV1-%Pred-Pre: 78 %
FEV1-Post: 1.77 L
FEV1-Pre: 1.66 L
FEV1FVC-%Change-Post: 4 %
FEV1FVC-%Pred-Pre: 93 %
FEV6-%Change-Post: 1 %
FEV6-%Pred-Post: 88 %
FEV6-%Pred-Pre: 87 %
FEV6-Post: 2.36 L
FEV6-Pre: 2.32 L
FEV6FVC-%Pred-Post: 104 %
FEV6FVC-%Pred-Pre: 104 %
FVC-%Change-Post: 1 %
FVC-%Pred-Post: 85 %
FVC-%Pred-Pre: 83 %
FVC-Post: 2.36 L
FVC-Pre: 2.32 L
Post FEV1/FVC ratio: 75 %
Post FEV6/FVC ratio: 100 %
Pre FEV1/FVC ratio: 71 %
Pre FEV6/FVC Ratio: 100 %
RV % pred: 120 %
RV: 2.35 L
TLC % pred: 100 %
TLC: 4.62 L

## 2022-12-26 NOTE — Progress Notes (Signed)
Full PFT performed today. °

## 2022-12-26 NOTE — Patient Instructions (Signed)
Full PFT performed today. °

## 2022-12-27 ENCOUNTER — Encounter: Payer: Self-pay | Admitting: Acute Care

## 2022-12-27 ENCOUNTER — Ambulatory Visit (INDEPENDENT_AMBULATORY_CARE_PROVIDER_SITE_OTHER): Payer: Medicare Other | Admitting: Acute Care

## 2022-12-27 VITALS — BP 126/74 | HR 63 | Temp 98.4°F | Ht 61.0 in | Wt 130.0 lb

## 2022-12-27 DIAGNOSIS — R918 Other nonspecific abnormal finding of lung field: Secondary | ICD-10-CM | POA: Diagnosis not present

## 2022-12-27 NOTE — Patient Instructions (Addendum)
It is good to see you today. Your PET scan continues to show right hilar hypermetabolism and atelectasis. I will discuss this with Dr. Valeta Harms, but I think he will want to re-biopsy this area. Your Pulmonary function tests look good.  I will discuss your results with Dr. Valeta Harms and let you know what he recommends.  Follow up with your PCP about your blood sugars.  Please contact office for sooner follow up if symptoms do not improve or worsen or seek emergency care   Plan will be for 01/17/2023 repeat biopsy

## 2022-12-27 NOTE — H&P (View-Only) (Signed)
History of Present Illness Monique [name] is a 67 y.o. female former smoker ( Quit 2018 with a 50+   pack year smoking history)  with  history of left breast cancer in 2018, SP mastectomy, followed by antiestrogen oral therapy which she stopped after a short time due to intolerence. She was referred to [facility]  for evaluation of bilateral pulmonary nodules and abnormal lung cancer screening CT ordered by her PCP Lynnea Ferrier) . She is followed by Dr. Tonia Brooms.   Abnormal Low Dose Screening CT chest 02/2022 PET scan 04/21/2022>> SUV max of 6 in right hilum Bronchoscopy, which was done 05/18/2022.  Biopsies of the left bronchoscopy were negative and nothing found within the hilum.   Repeat CT chest 09/22/2022, which showed  suspicion  for progressive malignancy . Right lower lobe pulmonary nodule stable in size but still concerning as it is spiculated .   Plan was for repeat bronchoscopy 11/01/2022, but Dr. Tonia Brooms was very sick, and patient did not want another physician to do the procedure. Plan was to repeat Super D and PET scan 12/22/2022 and re-evaluare.    12/27/2022 Pt. Presents for follow up after repeat CT Chest and PET scan to re-evaluate Right hilum with previously noted hypermetabolic activity on PET and Right LL nodule that is 9 mm in size and has very low metabolic activity. She has been doing well. She has had some weight gain , and her blood sugars are high.I have asked her top follow up with her PCP.  We discussed the results of the PET scan and the Super D CT Chest. I explained that there is still an area of concern in the right hilum that I suspect Dr. Tonia Brooms will recommend we re-biopsy.  No shortness of breath. She states she feels great and has had a great Christmas. She is having eye surgery 01/09/2023.  I have spoken with Dr. Tonia Brooms. He has reviewed the recent scan and he feels the site needs to be re-biopsied. I called the patient ( as she had already left when I heard back  from Dr. Tonia Brooms) .I explained, that as we had discussed in the office this morning, Dr. Tonia Brooms feels she needs the area of concern in her right hilum re-biopsied. She is in agreement with this but wants to wait until after her eye surgery which is scheduled for 01/09/2023. I have asked Dr. Tonia Brooms for dates he has openings to schedule the procedure. He has availability on 01/17/2022. She denies any respiratory problems.   Test Results: 12/21/2022 PET Scan CHEST: Focal hypermetabolic activity in the anterior RIGHT hilum adjacent to the mediastinal pleural surface is focal and intense with SUV max equal 7.4. This lesion is not significant changed from comparison exam (SUV max equal 6.2). No clear lesion identified on the noncontrast CT.   There is atelectasis in the RIGHT middle lobe medially unchanged from prior.   Previous described nodule in the RIGHT lower lobe measures 9 mm (image 72) has very low metabolic activity (SUV max 1.4) which is less than blood pool activity.   Incidental CT findings: Post LEFT mastectomy anatomy.   ABDOMEN/PELVIS: No abnormal hypermetabolic activity within the liver, pancreas, adrenal glands, or spleen. No hypermetabolic lymph nodes in the abdomen or pelvis.   Incidental CT findings: None.   SKELETON: No focal hypermetabolic activity to suggest skeletal metastasis.   Incidental CT findings: None.   IMPRESSION: 1. Persistent intense metabolic activity in the anterior RIGHT hilum. No clear  lesion identified on noncontrast CT. Findings concerning neoplasm obstructing the bronchus with RIGHT middle lobe atelectasis. 2. No change in RIGHT lower lobe pulmonary nodule with very low metabolic activity. 3. Post LEFT mastectomy anatomy.       12/21/2022 PET Super D Chest   IMPRESSION: 1. Single persistent RIGHT lower lobe pulmonary nodule. No significant FDG activity. 2. Continued atelectasis of the RIGHT middle lobe. Obstructing lesion not well identified  on noncontrast exam. 3. No mediastinal adenopathy. 4. Aortic Atherosclerosis (ICD10-I70.0).     12/26/2022 PFT       Latest Ref Rng & Units 12/02/2022    8:59 AM 05/10/2022    9:18 AM 12/02/2021    8:59 AM  CBC  WBC 3.8 - 10.8 Thousand/uL 6.3  6.2  6.3   Hemoglobin 11.7 - 15.5 g/dL 12.7  14.0  13.2   Hematocrit 35.0 - 45.0 % 38.3  42.8  40.1   Platelets 140 - 400 Thousand/uL 297  325  323        Latest Ref Rng & Units 12/02/2022    8:59 AM 05/10/2022    9:18 AM 12/02/2021    8:59 AM  BMP  Glucose 65 - 99 mg/dL 91  98  89   BUN 7 - 25 mg/dL 19  10  10    Creatinine 0.50 - 1.05 mg/dL 0.83  0.92  0.84   BUN/Creat Ratio 6 - 22 (calc) SEE NOTE:   NOT APPLICABLE   Sodium 761 - 146 mmol/L 137  138  137   Potassium 3.5 - 5.3 mmol/L 4.4  4.6  4.2   Chloride 98 - 110 mmol/L 100  105  99   CO2 20 - 32 mmol/L 23  24  29    Calcium 8.6 - 10.4 mg/dL 9.5  9.4  9.3     BNP No results found for: "BNP"  ProBNP No results found for: "PROBNP"  PFT    Component Value Date/Time   FEV1PRE 1.66 12/26/2022 1101   FEV1POST 1.77 12/26/2022 1101   FVCPRE 2.32 12/26/2022 1101   FVCPOST 2.36 12/26/2022 1101   TLC 4.62 12/26/2022 1101   DLCOUNC 14.06 12/26/2022 1101   PREFEV1FVCRT 71 12/26/2022 1101   PSTFEV1FVCRT 75 12/26/2022 1101    NM PET Image Initial (PI) Skull Base To Thigh (F-18 FDG)  Result Date: 12/22/2022 CLINICAL DATA:  Subsequent treatment strategy for multiple pulmonary nodules. EXAM: NUCLEAR MEDICINE PET SKULL BASE TO THIGH TECHNIQUE: 6.2 mCi F-18 FDG was injected intravenously. Full-ring PET imaging was performed from the skull base to thigh after the radiotracer. CT data was obtained and used for attenuation correction and anatomic localization. Fasting blood glucose: 106 mg/dl COMPARISON:  PET-CT 04/20/2022, chest CT 09/22/2022 FINDINGS: Mediastinal blood pool activity: SUV max 2.3 Liver activity: SUV max NA NECK: No hypermetabolic lymph nodes in the neck. Incidental CT  findings: None. CHEST: Focal hypermetabolic activity in the anterior RIGHT hilum adjacent to the mediastinal pleural surface is focal and intense with SUV max equal 7.4. This lesion is not significant changed from comparison exam (SUV max equal 6.2). No clear lesion identified on the noncontrast CT. There is atelectasis in the RIGHT middle lobe medially unchanged from prior. Previous described nodule in the RIGHT lower lobe measures 9 mm (image 72) has very low metabolic activity (SUV max 1.4) which is less than blood pool activity. Incidental CT findings: Post LEFT mastectomy anatomy. ABDOMEN/PELVIS: No abnormal hypermetabolic activity within the liver, pancreas, adrenal glands, or spleen. No hypermetabolic lymph  nodes in the abdomen or pelvis. Incidental CT findings: None. SKELETON: No focal hypermetabolic activity to suggest skeletal metastasis. Incidental CT findings: None. IMPRESSION: 1. Persistent intense metabolic activity in the anterior RIGHT hilum. No clear lesion identified on noncontrast CT. Findings concerning neoplasm obstructing the bronchus with RIGHT middle lobe atelectasis. 2. No change in RIGHT lower lobe pulmonary nodule with very low metabolic activity. 3. Post LEFT mastectomy anatomy. Electronically Signed   By: Suzy Bouchard M.D.   On: 12/22/2022 15:16   NM PET SUPER D CT  Result Date: 12/22/2022 CLINICAL DATA:  Follow-up pulmonary nodules. Hypermetabolic nodule/RIGHT hilar node on comparison FDG PET scan. EXAM: CT CHEST WITHOUT CONTRAST TECHNIQUE: Multidetector CT imaging of the chest was performed using thin slice collimation for electromagnetic bronchoscopy planning purposes, without intravenous contrast. RADIATION DOSE REDUCTION: This exam was performed according to the departmental dose-optimization program which includes automated exposure control, adjustment of the mA and/or kV according to patient size and/or use of iterative reconstruction technique. COMPARISON:  FDG PET scan  04/20/2022, 05/22/2023, chest CT 09/22/2022 FINDINGS: Cardiovascular: Coronary artery calcification and aortic atherosclerotic calcification. Mediastinum/Nodes: No axillary or supraclavicular adenopathy. No mediastinal or hilar adenopathy. No pericardial fluid. Esophagus normal. Lungs/Pleura: Persistent band of atelectasis of the medial RIGHT middle lobe. Soft tissue nodule which is hypermetabolic on comparison exam is poorly demonstrated on noncontrast exam. One of the 2 nodules in the RIGHT lower lobe persists. Larger nodule measures 9 mm (image 84/4 adjacent to a RIGHT lobe bronchus) and compares to 8 mm on comparison CT. This nodule has very low metabolic activity (less than blood pool) on same day PET-CT scan. More peripheral nodule has resolved and now only linear branching remains. Upper Abdomen: Limited view of the liver, kidneys, pancreas are unremarkable. Normal adrenal glands. Musculoskeletal: IMPRESSION: 1. Single persistent RIGHT lower lobe pulmonary nodule. No significant FDG activity. 2. Continued atelectasis of the RIGHT middle lobe. Obstructing lesion not well identified on noncontrast exam. 3. No mediastinal adenopathy. 4. Aortic Atherosclerosis (ICD10-I70.0). Electronically Signed   By: Suzy Bouchard M.D.   On: 12/22/2022 15:13     Past medical hx Past Medical History:  Diagnosis Date   Abnormal EKG    Anxiety    Phreesia 11/29/2020   Arthritis    Phreesia 11/29/2020   Barrett's esophagus    Breast cancer ([place]) 2018   Left breast   Cancer ([place])    Phreesia 45/40/9811   Complication of anesthesia    Depression    Family history of breast cancer    Family history of prostate cancer    Family history of thyroid cancer    Fatigue    Generalized headaches    Hepatitis    IBS (irritable bowel syndrome)    Meniere disease    Osteopenia    PONV (postoperative nausea and vomiting)    Psoriasis    Tinnitus    left ear     Social History   Tobacco Use   Smoking status:  Former    Packs/day: 0.50    Types: Cigarettes    Quit date: 2018    Years since quitting: 6.0    Passive exposure: Yes   Smokeless tobacco: Never  Vaping Use   Vaping Use: Never used  Substance Use Topics   Alcohol use: Yes    Comment: occas   Drug use: Never    Ms.Hoggard reports that she quit smoking about 6 years ago. Her smoking use included cigarettes. She smoked an average of .  5 packs per day. She has been exposed to tobacco smoke. She has never used smokeless tobacco. She reports current alcohol use. She reports that she does not use drugs.  Tobacco Cessation: Former smoker quit 2018 with a 20+ pack year follow up.    Past surgical hx, Family hx, Social hx all reviewed.  Current Outpatient Medications on File Prior to Visit  Medication Sig   APPLE CIDER VINEGAR PO Take 1 capsule by mouth daily.   aspirin EC 81 MG tablet Take 1 tablet (81 mg total) by mouth daily. Swallow whole.   atorvastatin (LIPITOR) 40 MG tablet Take 1 tablet (40 mg total) by mouth daily.   diazepam (VALIUM) 10 MG tablet Take 1 tablet (10 mg total) by mouth every 12 (twelve) hours as needed for anxiety. (Patient taking differently: Take 5 mg by mouth at bedtime as needed for sleep or anxiety.)   Multiple Vitamins-Minerals (AIRBORNE PO) Take 1 tablet by mouth daily.   HYDROcodone bit-homatropine (HYCODAN) 5-1.5 MG/5ML syrup Take 5 mLs by mouth every 6 (six) hours as needed for cough. (Patient not taking: Reported on 12/27/2022)   Current Facility-Administered Medications on File Prior to Visit  Medication   0.9 %  sodium chloride infusion     Allergies  Allergen Reactions   Chantix [Varenicline]     Hallucination   Codeine Nausea And Vomiting   Wellbutrin [Bupropion] Other (See Comments)    Hallucinations      Review Of Systems:  Constitutional:   No  weight loss, night sweats,  Fevers, chills, fatigue, or  lassitude.  HEENT:   No headaches,  Difficulty swallowing,  Tooth/dental problems, or   Sore throat,                No sneezing, itching, ear ache, nasal congestion, post nasal drip,   CV:  No chest pain,  Orthopnea, PND, swelling in lower extremities, anasarca, dizziness, palpitations, syncope.   GI  No heartburn, indigestion, abdominal pain, nausea, vomiting, diarrhea, change in bowel habits, loss of appetite, bloody stools.   Resp: No shortness of breath with exertion or at rest.  No excess mucus, no productive cough,  No non-productive cough,  No coughing up of blood.  No change in color of mucus.  No wheezing.  No chest wall deformity  Skin: no rash or lesions.  GU: no dysuria, change in color of urine, no urgency or frequency.  No flank pain, no hematuria   MS:  No joint pain or swelling.  No decreased range of motion.  No back pain.  Psych:  No change in mood or affect. No depression or anxiety.  No memory loss.   Vital Signs BP 126/74 (BP Location: Right Arm, Patient Position: Sitting, Cuff Size: Normal)   Pulse 63   Temp 98.4 F (36.9 C) (Oral)   Ht 5\' 1"  (1.549 m)   Wt 130 lb (59 kg)   SpO2 96%   BMI 24.56 kg/m    Physical Exam:  General- No distress,  A&Ox3, pleasant ENT: No sinus tenderness, TM clear, pale nasal mucosa, no oral exudate,no post nasal drip, no LAN Cardiac: S1, S2, regular rate and rhythm, no murmur Chest: No wheeze/ rales/ dullness; no accessory muscle use, no nasal flaring, no sternal retractions Abd.: Soft Non-tender, ND, BS +, Body mass index is 24.56 kg/m.  Ext: No clubbing cyanosis, No edema Neuro:  normal strength, MAE x 4, A&O x 3 Skin: No rashes, warm and dry,. No lesions  Psych:  normal mood and behavior   Assessment/Plan Abnormal Chest imaging  Hypermetabolic right hilar area and atelectasis concerning for neoplasm  Previous biopsy of this area was negative for  malignancy, but there has been progression of area of concer Plan We will schedule patient for repeat biopsy Plan will be for 01/17/2023. We will have PPC's  get patient scheduled. Follow up after bronch with biopsies  I spent 40 minutes dedicated to the care of this patient on the date of this encounter to include pre-visit review of records, face-to-face time with the patient discussing conditions above, post visit ordering of testing, clinical documentation with the electronic health record, making appropriate referrals as documented, and communicating necessary information to the patient's healthcare team.      Bevelyn Ngo, NP 12/27/2022  1:27 PM

## 2022-12-27 NOTE — Progress Notes (Signed)
History of Present Illness Monique [name] is a 67 y.o. female former smoker ( Quit 2018 with a 50+   pack year smoking history)  with  history of left breast cancer in 2018, SP mastectomy, followed by antiestrogen oral therapy which she stopped after a short time due to intolerence. She was referred to [facility]  for evaluation of bilateral pulmonary nodules and abnormal lung cancer screening CT ordered by her PCP Jenna Luo) . She is followed by Dr. Valeta Harms.   Abnormal Low Dose Screening CT chest 02/2022 PET scan 04/21/2022>> SUV max of 6 in right hilum Bronchoscopy, which was done 05/18/2022.  Biopsies of the left bronchoscopy were negative and nothing found within the hilum.   Repeat CT chest 09/22/2022, which showed  suspicion  for progressive malignancy . Right lower lobe pulmonary nodule stable in size but still concerning as it is spiculated .   Plan was for repeat bronchoscopy 11/01/2022, but Dr. Valeta Harms was very sick, and patient did not want another physician to do the procedure. Plan was to repeat Super D and PET scan 12/22/2022 and re-evaluare.    12/27/2022 Pt. Presents for follow up after repeat CT Chest and PET scan to re-evaluate Right hilum with previously noted hypermetabolic activity on PET and Right LL nodule that is 9 mm in size and has very low metabolic activity. She has been doing well. She has had some weight gain , and her blood sugars are high.I have asked her top follow up with her PCP.  We discussed the results of the PET scan and the Super D CT Chest. I explained that there is still an area of concern in the right hilum that I suspect Dr. Valeta Harms will recommend we re-biopsy.  No shortness of breath. She states she feels great and has had a great Christmas. She is having eye surgery 01/09/2023.  I have spoken with Dr. Valeta Harms. He has reviewed the recent scan and he feels the site needs to be re-biopsied. I called the patient ( as she had already left when I heard back  from Dr. Valeta Harms) .I explained, that as we had discussed in the office this morning, Dr. Valeta Harms feels she needs the area of concern in her right hilum re-biopsied. She is in agreement with this but wants to wait until after her eye surgery which is scheduled for 01/09/2023. I have asked Dr. Valeta Harms for dates he has openings to schedule the procedure. He has availability on 01/17/2022. She denies any respiratory problems.   Test Results: 12/21/2022 PET Scan CHEST: Focal hypermetabolic activity in the anterior RIGHT hilum adjacent to the mediastinal pleural surface is focal and intense with SUV max equal 7.4. This lesion is not significant changed from comparison exam (SUV max equal 6.2). No clear lesion identified on the noncontrast CT.   There is atelectasis in the RIGHT middle lobe medially unchanged from prior.   Previous described nodule in the RIGHT lower lobe measures 9 mm (image 72) has very low metabolic activity (SUV max 1.4) which is less than blood pool activity.   Incidental CT findings: Post LEFT mastectomy anatomy.   ABDOMEN/PELVIS: No abnormal hypermetabolic activity within the liver, pancreas, adrenal glands, or spleen. No hypermetabolic lymph nodes in the abdomen or pelvis.   Incidental CT findings: None.   SKELETON: No focal hypermetabolic activity to suggest skeletal metastasis.   Incidental CT findings: None.   IMPRESSION: 1. Persistent intense metabolic activity in the anterior RIGHT hilum. No clear  lesion identified on noncontrast CT. Findings concerning neoplasm obstructing the bronchus with RIGHT middle lobe atelectasis. 2. No change in RIGHT lower lobe pulmonary nodule with very low metabolic activity. 3. Post LEFT mastectomy anatomy.       12/21/2022 PET Super D Chest   IMPRESSION: 1. Single persistent RIGHT lower lobe pulmonary nodule. No significant FDG activity. 2. Continued atelectasis of the RIGHT middle lobe. Obstructing lesion not well identified  on noncontrast exam. 3. No mediastinal adenopathy. 4. Aortic Atherosclerosis (ICD10-I70.0).     12/26/2022 PFT       Latest Ref Rng & Units 12/02/2022    8:59 AM 05/10/2022    9:18 AM 12/02/2021    8:59 AM  CBC  WBC 3.8 - 10.8 Thousand/uL 6.3  6.2  6.3   Hemoglobin 11.7 - 15.5 g/dL 12.7  14.0  13.2   Hematocrit 35.0 - 45.0 % 38.3  42.8  40.1   Platelets 140 - 400 Thousand/uL 297  325  323        Latest Ref Rng & Units 12/02/2022    8:59 AM 05/10/2022    9:18 AM 12/02/2021    8:59 AM  BMP  Glucose 65 - 99 mg/dL 91  98  89   BUN 7 - 25 mg/dL 19  10  10    Creatinine 0.50 - 1.05 mg/dL 0.83  0.92  0.84   BUN/Creat Ratio 6 - 22 (calc) SEE NOTE:   NOT APPLICABLE   Sodium 761 - 146 mmol/L 137  138  137   Potassium 3.5 - 5.3 mmol/L 4.4  4.6  4.2   Chloride 98 - 110 mmol/L 100  105  99   CO2 20 - 32 mmol/L 23  24  29    Calcium 8.6 - 10.4 mg/dL 9.5  9.4  9.3     BNP No results found for: "BNP"  ProBNP No results found for: "PROBNP"  PFT    Component Value Date/Time   FEV1PRE 1.66 12/26/2022 1101   FEV1POST 1.77 12/26/2022 1101   FVCPRE 2.32 12/26/2022 1101   FVCPOST 2.36 12/26/2022 1101   TLC 4.62 12/26/2022 1101   DLCOUNC 14.06 12/26/2022 1101   PREFEV1FVCRT 71 12/26/2022 1101   PSTFEV1FVCRT 75 12/26/2022 1101    NM PET Image Initial (PI) Skull Base To Thigh (F-18 FDG)  Result Date: 12/22/2022 CLINICAL DATA:  Subsequent treatment strategy for multiple pulmonary nodules. EXAM: NUCLEAR MEDICINE PET SKULL BASE TO THIGH TECHNIQUE: 6.2 mCi F-18 FDG was injected intravenously. Full-ring PET imaging was performed from the skull base to thigh after the radiotracer. CT data was obtained and used for attenuation correction and anatomic localization. Fasting blood glucose: 106 mg/dl COMPARISON:  PET-CT 04/20/2022, chest CT 09/22/2022 FINDINGS: Mediastinal blood pool activity: SUV max 2.3 Liver activity: SUV max NA NECK: No hypermetabolic lymph nodes in the neck. Incidental CT  findings: None. CHEST: Focal hypermetabolic activity in the anterior RIGHT hilum adjacent to the mediastinal pleural surface is focal and intense with SUV max equal 7.4. This lesion is not significant changed from comparison exam (SUV max equal 6.2). No clear lesion identified on the noncontrast CT. There is atelectasis in the RIGHT middle lobe medially unchanged from prior. Previous described nodule in the RIGHT lower lobe measures 9 mm (image 72) has very low metabolic activity (SUV max 1.4) which is less than blood pool activity. Incidental CT findings: Post LEFT mastectomy anatomy. ABDOMEN/PELVIS: No abnormal hypermetabolic activity within the liver, pancreas, adrenal glands, or spleen. No hypermetabolic lymph  nodes in the abdomen or pelvis. Incidental CT findings: None. SKELETON: No focal hypermetabolic activity to suggest skeletal metastasis. Incidental CT findings: None. IMPRESSION: 1. Persistent intense metabolic activity in the anterior RIGHT hilum. No clear lesion identified on noncontrast CT. Findings concerning neoplasm obstructing the bronchus with RIGHT middle lobe atelectasis. 2. No change in RIGHT lower lobe pulmonary nodule with very low metabolic activity. 3. Post LEFT mastectomy anatomy. Electronically Signed   By: Suzy Bouchard M.D.   On: 12/22/2022 15:16   NM PET SUPER D CT  Result Date: 12/22/2022 CLINICAL DATA:  Follow-up pulmonary nodules. Hypermetabolic nodule/RIGHT hilar node on comparison FDG PET scan. EXAM: CT CHEST WITHOUT CONTRAST TECHNIQUE: Multidetector CT imaging of the chest was performed using thin slice collimation for electromagnetic bronchoscopy planning purposes, without intravenous contrast. RADIATION DOSE REDUCTION: This exam was performed according to the departmental dose-optimization program which includes automated exposure control, adjustment of the mA and/or kV according to patient size and/or use of iterative reconstruction technique. COMPARISON:  FDG PET scan  04/20/2022, 05/22/2023, chest CT 09/22/2022 FINDINGS: Cardiovascular: Coronary artery calcification and aortic atherosclerotic calcification. Mediastinum/Nodes: No axillary or supraclavicular adenopathy. No mediastinal or hilar adenopathy. No pericardial fluid. Esophagus normal. Lungs/Pleura: Persistent band of atelectasis of the medial RIGHT middle lobe. Soft tissue nodule which is hypermetabolic on comparison exam is poorly demonstrated on noncontrast exam. One of the 2 nodules in the RIGHT lower lobe persists. Larger nodule measures 9 mm (image 84/4 adjacent to a RIGHT lobe bronchus) and compares to 8 mm on comparison CT. This nodule has very low metabolic activity (less than blood pool) on same day PET-CT scan. More peripheral nodule has resolved and now only linear branching remains. Upper Abdomen: Limited view of the liver, kidneys, pancreas are unremarkable. Normal adrenal glands. Musculoskeletal: IMPRESSION: 1. Single persistent RIGHT lower lobe pulmonary nodule. No significant FDG activity. 2. Continued atelectasis of the RIGHT middle lobe. Obstructing lesion not well identified on noncontrast exam. 3. No mediastinal adenopathy. 4. Aortic Atherosclerosis (ICD10-I70.0). Electronically Signed   By: Suzy Bouchard M.D.   On: 12/22/2022 15:13     Past medical hx Past Medical History:  Diagnosis Date   Abnormal EKG    Anxiety    Phreesia 11/29/2020   Arthritis    Phreesia 11/29/2020   Barrett's esophagus    Breast cancer ([place]) 2018   Left breast   Cancer ([place])    Phreesia 44/02/4741   Complication of anesthesia    Depression    Family history of breast cancer    Family history of prostate cancer    Family history of thyroid cancer    Fatigue    Generalized headaches    Hepatitis    IBS (irritable bowel syndrome)    Meniere disease    Osteopenia    PONV (postoperative nausea and vomiting)    Psoriasis    Tinnitus    left ear     Social History   Tobacco Use   Smoking status:  Former    Packs/day: 0.50    Types: Cigarettes    Quit date: 2018    Years since quitting: 6.0    Passive exposure: Yes   Smokeless tobacco: Never  Vaping Use   Vaping Use: Never used  Substance Use Topics   Alcohol use: Yes    Comment: occas   Drug use: Never    Ms.Zuver reports that she quit smoking about 6 years ago. Her smoking use included cigarettes. She smoked an average of .  5 packs per day. She has been exposed to tobacco smoke. She has never used smokeless tobacco. She reports current alcohol use. She reports that she does not use drugs.  Tobacco Cessation: Former smoker quit 2018 with a 20+ pack year follow up.    Past surgical hx, Family hx, Social hx all reviewed.  Current Outpatient Medications on File Prior to Visit  Medication Sig   APPLE CIDER VINEGAR PO Take 1 capsule by mouth daily.   aspirin EC 81 MG tablet Take 1 tablet (81 mg total) by mouth daily. Swallow whole.   atorvastatin (LIPITOR) 40 MG tablet Take 1 tablet (40 mg total) by mouth daily.   diazepam (VALIUM) 10 MG tablet Take 1 tablet (10 mg total) by mouth every 12 (twelve) hours as needed for anxiety. (Patient taking differently: Take 5 mg by mouth at bedtime as needed for sleep or anxiety.)   Multiple Vitamins-Minerals (AIRBORNE PO) Take 1 tablet by mouth daily.   HYDROcodone bit-homatropine (HYCODAN) 5-1.5 MG/5ML syrup Take 5 mLs by mouth every 6 (six) hours as needed for cough. (Patient not taking: Reported on 12/27/2022)   Current Facility-Administered Medications on File Prior to Visit  Medication   0.9 %  sodium chloride infusion     Allergies  Allergen Reactions   Chantix [Varenicline]     Hallucination   Codeine Nausea And Vomiting   Wellbutrin [Bupropion] Other (See Comments)    Hallucinations      Review Of Systems:  Constitutional:   No  weight loss, night sweats,  Fevers, chills, fatigue, or  lassitude.  HEENT:   No headaches,  Difficulty swallowing,  Tooth/dental problems, or   Sore throat,                No sneezing, itching, ear ache, nasal congestion, post nasal drip,   CV:  No chest pain,  Orthopnea, PND, swelling in lower extremities, anasarca, dizziness, palpitations, syncope.   GI  No heartburn, indigestion, abdominal pain, nausea, vomiting, diarrhea, change in bowel habits, loss of appetite, bloody stools.   Resp: No shortness of breath with exertion or at rest.  No excess mucus, no productive cough,  No non-productive cough,  No coughing up of blood.  No change in color of mucus.  No wheezing.  No chest wall deformity  Skin: no rash or lesions.  GU: no dysuria, change in color of urine, no urgency or frequency.  No flank pain, no hematuria   MS:  No joint pain or swelling.  No decreased range of motion.  No back pain.  Psych:  No change in mood or affect. No depression or anxiety.  No memory loss.   Vital Signs BP 126/74 (BP Location: Right Arm, Patient Position: Sitting, Cuff Size: Normal)   Pulse 63   Temp 98.4 F (36.9 C) (Oral)   Ht 5\' 1"  (1.549 m)   Wt 130 lb (59 kg)   SpO2 96%   BMI 24.56 kg/m    Physical Exam:  General- No distress,  A&Ox3, pleasant ENT: No sinus tenderness, TM clear, pale nasal mucosa, no oral exudate,no post nasal drip, no LAN Cardiac: S1, S2, regular rate and rhythm, no murmur Chest: No wheeze/ rales/ dullness; no accessory muscle use, no nasal flaring, no sternal retractions Abd.: Soft Non-tender, ND, BS +, Body mass index is 24.56 kg/m.  Ext: No clubbing cyanosis, No edema Neuro:  normal strength, MAE x 4, A&O x 3 Skin: No rashes, warm and dry,. No lesions  Psych:  normal mood and behavior   Assessment/Plan Abnormal Chest imaging  Hypermetabolic right hilar area and atelectasis concerning for neoplasm  Previous biopsy of this area was negative for  malignancy, but there has been progression of area of concer Plan We will schedule patient for repeat biopsy Plan will be for 01/17/2023. We will have PPC's  get patient scheduled. Follow up after bronch with biopsies  I spent 40 minutes dedicated to the care of this patient on the date of this encounter to include pre-visit review of records, face-to-face time with the patient discussing conditions above, post visit ordering of testing, clinical documentation with the electronic health record, making appropriate referrals as documented, and communicating necessary information to the patient's healthcare team.      Magdalen Spatz, NP 12/27/2022  1:27 PM

## 2022-12-27 NOTE — Addendum Note (Signed)
Addended by: Eric Form F on: 12/27/2022 02:00 PM   Modules accepted: Orders

## 2022-12-30 ENCOUNTER — Other Ambulatory Visit: Payer: Self-pay | Admitting: Family Medicine

## 2023-01-02 ENCOUNTER — Encounter: Payer: Self-pay | Admitting: Pulmonary Disease

## 2023-01-03 ENCOUNTER — Ambulatory Visit (INDEPENDENT_AMBULATORY_CARE_PROVIDER_SITE_OTHER): Payer: Medicare Other | Admitting: Family Medicine

## 2023-01-03 ENCOUNTER — Encounter: Payer: Self-pay | Admitting: Family Medicine

## 2023-01-03 VITALS — BP 128/62 | HR 74 | Temp 98.6°F | Ht 61.0 in | Wt 130.0 lb

## 2023-01-03 DIAGNOSIS — R739 Hyperglycemia, unspecified: Secondary | ICD-10-CM

## 2023-01-03 NOTE — Progress Notes (Signed)
Subjective:    Patient ID: Monique [name], female    DOB: December 22, 1955, 67 y.o.   MRN: [id number]  HPI  Patient is a very pleasant 67 year old Caucasian female who recently had a PET scan to monitor a lesion in her lung.  Prior to the PET scan she had a fasting blood sugar that was a capillary stick that was 106.  She has never had an elevated sugar on any of her lab work before.  She has no previous history of diabetes.  She is here today to discuss this mildly elevated sugar Past Medical History:  Diagnosis Date   Abnormal EKG    Anxiety    Phreesia 11/29/2020   Arthritis    Phreesia 11/29/2020   Barrett's esophagus    Breast cancer ([place]) 2018   Left breast   Cancer ([place])    Phreesia 59/56/3875   Complication of anesthesia    Depression    Family history of breast cancer    Family history of prostate cancer    Family history of thyroid cancer    Fatigue    Generalized headaches    Hepatitis    IBS (irritable bowel syndrome)    Meniere disease    Osteopenia    PONV (postoperative nausea and vomiting)    Psoriasis    Tinnitus    left ear   Past Surgical History:  Procedure Laterality Date   BREAST SURGERY N/A    Phreesia 11/29/2020   DILATION AND CURETTAGE OF UTERUS     FINE NEEDLE ASPIRATION  05/10/2022   Procedure: FINE NEEDLE ASPIRATION (FNA) LINEAR;  Surgeon: Garner Nash, DO;  Location: [facility];  Service: Pulmonary;;   MASTECTOMY Left 2018   MASTECTOMY W/ SENTINEL NODE BIOPSY Left 08/03/2017   Procedure: LEFT MASTECTOMY WITH LEFT AXILLARY SENTINEL LYMPH NODE BIOPSY;  Surgeon: Alphonsa Overall, MD;  Location: [facility];  Service: General;  Laterality: Left;   skin cancer removal     TONSILLECTOMY     VIDEO BRONCHOSCOPY WITH ENDOBRONCHIAL ULTRASOUND N/A 05/10/2022   Procedure: VIDEO BRONCHOSCOPY WITH ENDOBRONCHIAL ULTRASOUND;  Surgeon: Garner Nash, DO;  Location: [facility];  Service: Pulmonary;  Laterality: N/A;   Current Outpatient  Medications on File Prior to Visit  Medication Sig Dispense Refill   APPLE CIDER VINEGAR PO Take 1 capsule by mouth daily.     aspirin EC 81 MG tablet Take 1 tablet (81 mg total) by mouth daily. Swallow whole. 90 tablet 3   atorvastatin (LIPITOR) 40 MG tablet Take 1 tablet (40 mg total) by mouth daily. 90 tablet 3   diazepam (VALIUM) 10 MG tablet TAKE ONE TABLET BY MOUTH EVERY 12 HOURS AS NEEDED FOR ANXIETY 30 tablet 1   Multiple Vitamins-Minerals (AIRBORNE PO) Take 1 tablet by mouth daily.     Current Facility-Administered Medications on File Prior to Visit  Medication Dose Route Frequency Provider Last Rate Last Admin   0.9 %  sodium chloride infusion  500 mL Intravenous Once Daryel November, MD       Allergies  Allergen Reactions   Chantix [Varenicline]     Hallucination   Codeine Nausea And Vomiting   Wellbutrin [Bupropion] Other (See Comments)    Hallucinations     Social History   Socioeconomic History   Marital status: Married    Spouse name: Not on file   Number of children: Not on file   Years of education: Not on file   Highest education  level: Not on file  Occupational History   Not on file  Tobacco Use   Smoking status: Former    Packs/day: 0.50    Types: Cigarettes    Quit date: 2018    Years since quitting: 6.0    Passive exposure: Yes   Smokeless tobacco: Never  Vaping Use   Vaping Use: Never used  Substance and Sexual Activity   Alcohol use: Yes    Comment: occas   Drug use: Never   Sexual activity: Not Currently    Birth control/protection: Post-menopausal  Other Topics Concern   Not on file  Social History Narrative   Not on file   Social Determinants of Health   Financial Resource Strain: Low Risk  (01/11/2022)   Overall Financial Resource Strain (CARDIA)    Difficulty of Paying Living Expenses: Not very hard  Food Insecurity: Food Insecurity Present (01/11/2022)   Hunger Vital Sign    Worried About Running Out of Food in the Last Year:  Sometimes true    Ran Out of Food in the Last Year: Never true  Transportation Needs: No Transportation Needs (01/11/2022)   PRAPARE - Administrator, Civil Service (Medical): No    Lack of Transportation (Non-Medical): No  Physical Activity: Sufficiently Active (01/11/2022)   Exercise Vital Sign    Days of Exercise per Week: 5 days    Minutes of Exercise per Session: 30 min  Stress: Stress Concern Present (01/11/2022)   [employer] of Occupational Health - Occupational Stress Questionnaire    Feeling of Stress : Rather much  Social Connections: Moderately Isolated (01/11/2022)   Social Connection and Isolation Panel [NHANES]    Frequency of Communication with Friends and Family: More than three times a week    Frequency of Social Gatherings with Friends and Family: Once a week    Attends Religious Services: Never    Database administrator or Organizations: No    Attends Banker Meetings: Never    Marital Status: Married  Catering manager Violence: Not At Risk (01/11/2022)   Humiliation, Afraid, Rape, and Kick questionnaire    Fear of Current or Ex-Partner: No    Emotionally Abused: No    Physically Abused: No    Sexually Abused: No      Review of Systems  All other systems reviewed and are negative.      Objective:   Physical Exam Vitals reviewed.  Constitutional:      General: She is not in acute distress.    Appearance: She is well-developed. She is not diaphoretic.  HENT:     Head: Normocephalic and atraumatic.  Neck:     Thyroid: No thyromegaly.     Vascular: No JVD.     Trachea: No tracheal deviation.  Cardiovascular:     Rate and Rhythm: Regular rhythm.     Heart sounds: Normal heart sounds. No murmur heard.    No friction rub. No gallop.  Pulmonary:     Effort: Pulmonary effort is normal. No respiratory distress.     Breath sounds: Normal breath sounds. No stridor. No wheezing or rales.  Chest:     Chest wall: No tenderness.   Neurological:     Mental Status: She is alert.     Motor: No abnormal muscle tone.           Assessment & Plan:  Elevated blood sugar - Plan: Hemoglobin A1c I believe this is likely a false positive.  I  will check a hemoglobin A1c today to get an idea of the last 3 months how her sugars are been performing.  We did discuss a low carbohydrate diet but I certainly do not feel the patient needs any medication to lower her sugars at this point.  Await the results of her hemoglobin A1c

## 2023-01-04 LAB — HEMOGLOBIN A1C
Hgb A1c MFr Bld: 5.9 % of total Hgb — ABNORMAL HIGH (ref <5.7)
Mean Plasma Glucose: 123 mg/dL
eAG (mmol/L): 6.8 mmol/L

## 2023-01-09 DIAGNOSIS — H53483 Generalized contraction of visual field, bilateral: Secondary | ICD-10-CM | POA: Diagnosis not present

## 2023-01-09 DIAGNOSIS — H53453 Other localized visual field defect, bilateral: Secondary | ICD-10-CM | POA: Diagnosis not present

## 2023-01-09 DIAGNOSIS — H02834 Dermatochalasis of left upper eyelid: Secondary | ICD-10-CM | POA: Diagnosis not present

## 2023-01-09 DIAGNOSIS — H0279 Other degenerative disorders of eyelid and periocular area: Secondary | ICD-10-CM | POA: Diagnosis not present

## 2023-01-09 DIAGNOSIS — H02831 Dermatochalasis of right upper eyelid: Secondary | ICD-10-CM | POA: Diagnosis not present

## 2023-01-11 NOTE — Telephone Encounter (Signed)
Mychart message sent by pt: Monique [name] [facility] (supporting Magdalen Spatz, NP)5 hours ago (9:26 AM)    Our family has a Stage manager page. I finally mentioned the issues I have been facing with my lungs. [name]. aunts. uncles and even my dad had issues. 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 With the biopsy am I tested for other things or just cancer?    Dr. Valeta Harms, please advise.

## 2023-01-13 ENCOUNTER — Other Ambulatory Visit: Payer: Medicare Other

## 2023-01-13 ENCOUNTER — Other Ambulatory Visit: Payer: Self-pay | Admitting: *Deleted

## 2023-01-13 ENCOUNTER — Encounter: Payer: Self-pay | Admitting: Pulmonary Disease

## 2023-01-13 ENCOUNTER — Telehealth: Payer: Self-pay | Admitting: Pulmonary Disease

## 2023-01-13 DIAGNOSIS — Z01818 Encounter for other preprocedural examination: Secondary | ICD-10-CM

## 2023-01-13 NOTE — Telephone Encounter (Signed)
Spoke to pt and went over [employer] with her.  Sent letter thru [employer].  Nothing further needed.

## 2023-01-15 LAB — SPECIMEN STATUS REPORT

## 2023-01-15 LAB — NOVEL CORONAVIRUS, NAA: SARS-CoV-2, NAA: NOT DETECTED

## 2023-01-16 ENCOUNTER — Other Ambulatory Visit: Payer: Self-pay

## 2023-01-16 ENCOUNTER — Encounter (HOSPITAL_COMMUNITY): Payer: Self-pay | Admitting: Pulmonary Disease

## 2023-01-16 NOTE — Progress Notes (Signed)
Spoke with pt for pre-op call. Pt denies cardiac history, HTN or diabetes.  Shower instructions given to pt and she voiced understanding.   Covid test done 01/13/23 and it's negative.

## 2023-01-17 ENCOUNTER — Encounter (HOSPITAL_COMMUNITY): Payer: Self-pay | Admitting: Pulmonary Disease

## 2023-01-17 ENCOUNTER — Ambulatory Visit (HOSPITAL_COMMUNITY)
Admission: RE | Admit: 2023-01-17 | Discharge: 2023-01-17 | Disposition: A | Discharge status: Home / Self Care | Payer: Medicare Other | Attending: Pulmonary Disease | Admitting: Pulmonary Disease

## 2023-01-17 ENCOUNTER — Telehealth: Payer: Self-pay | Admitting: Internal Medicine

## 2023-01-17 ENCOUNTER — Ambulatory Visit (HOSPITAL_BASED_OUTPATIENT_CLINIC_OR_DEPARTMENT_OTHER): Payer: Medicare Other | Admitting: Anesthesiology

## 2023-01-17 ENCOUNTER — Encounter (HOSPITAL_COMMUNITY)
Admission: RE | Disposition: A | Discharge status: Home / Self Care | Payer: Self-pay | Source: Home / Self Care | Attending: Pulmonary Disease

## 2023-01-17 ENCOUNTER — Ambulatory Visit (HOSPITAL_COMMUNITY): Payer: Medicare Other | Admitting: Anesthesiology

## 2023-01-17 DIAGNOSIS — Z79899 Other long term (current) drug therapy: Secondary | ICD-10-CM | POA: Diagnosis not present

## 2023-01-17 DIAGNOSIS — F419 Anxiety disorder, unspecified: Secondary | ICD-10-CM | POA: Diagnosis not present

## 2023-01-17 DIAGNOSIS — C7801 Secondary malignant neoplasm of right lung: Secondary | ICD-10-CM | POA: Insufficient documentation

## 2023-01-17 DIAGNOSIS — Z9012 Acquired absence of left breast and nipple: Secondary | ICD-10-CM | POA: Diagnosis not present

## 2023-01-17 DIAGNOSIS — Z87891 Personal history of nicotine dependence: Secondary | ICD-10-CM | POA: Insufficient documentation

## 2023-01-17 DIAGNOSIS — R911 Solitary pulmonary nodule: Secondary | ICD-10-CM

## 2023-01-17 DIAGNOSIS — M1990 Unspecified osteoarthritis, unspecified site: Secondary | ICD-10-CM | POA: Diagnosis not present

## 2023-01-17 DIAGNOSIS — Z853 Personal history of malignant neoplasm of breast: Secondary | ICD-10-CM | POA: Insufficient documentation

## 2023-01-17 DIAGNOSIS — I700 Atherosclerosis of aorta: Secondary | ICD-10-CM | POA: Insufficient documentation

## 2023-01-17 DIAGNOSIS — R918 Other nonspecific abnormal finding of lung field: Secondary | ICD-10-CM | POA: Diagnosis not present

## 2023-01-17 DIAGNOSIS — K219 Gastro-esophageal reflux disease without esophagitis: Secondary | ICD-10-CM | POA: Diagnosis not present

## 2023-01-17 DIAGNOSIS — G709 Myoneural disorder, unspecified: Secondary | ICD-10-CM | POA: Insufficient documentation

## 2023-01-17 DIAGNOSIS — C50919 Malignant neoplasm of unspecified site of unspecified female breast: Secondary | ICD-10-CM | POA: Diagnosis not present

## 2023-01-17 HISTORY — PX: VIDEO BRONCHOSCOPY WITH ENDOBRONCHIAL ULTRASOUND: SHX6177

## 2023-01-17 HISTORY — DX: Pneumonia, unspecified organism: J18.9

## 2023-01-17 HISTORY — PX: BRONCHIAL BIOPSY: SHX5109

## 2023-01-17 HISTORY — PX: BRONCHIAL BRUSHINGS: SHX5108

## 2023-01-17 LAB — COMPREHENSIVE METABOLIC PANEL
ALT: 17 U/L (ref 0–44)
AST: 26 U/L (ref 15–41)
Albumin: 4.2 g/dL (ref 3.5–5.0)
Alkaline Phosphatase: 59 U/L (ref 38–126)
Anion gap: 12 (ref 5–15)
BUN: 8 mg/dL (ref 8–23)
CO2: 27 mmol/L (ref 22–32)
Calcium: 9.2 mg/dL (ref 8.9–10.3)
Chloride: 102 mmol/L (ref 98–111)
Creatinine, Ser: 0.83 mg/dL (ref 0.44–1.00)
GFR, Estimated: >60 mL/min (ref >60)
Glucose, Bld: 106 mg/dL — ABNORMAL HIGH (ref 70–99)
Potassium: 4 mmol/L (ref 3.5–5.1)
Sodium: 141 mmol/L (ref 135–145)
Total Bilirubin: 0.6 mg/dL (ref 0.3–1.2)
Total Protein: 7.2 g/dL (ref 6.5–8.1)

## 2023-01-17 LAB — CBC
HCT: 41.9 % (ref 36.0–46.0)
Hemoglobin: 13.1 g/dL (ref 12.0–15.0)
MCH: 27.8 pg (ref 26.0–34.0)
MCHC: 31.3 g/dL (ref 30.0–36.0)
MCV: 88.8 fL (ref 80.0–100.0)
Platelets: 323 10*3/uL (ref 150–400)
RBC: 4.72 MIL/uL (ref 3.87–5.11)
RDW: 13.1 % (ref 11.5–15.5)
WBC: 6.5 10*3/uL (ref 4.0–10.5)
nRBC: 0 % (ref 0.0–0.2)

## 2023-01-17 SURGERY — BRONCHOSCOPY, WITH EBUS
Anesthesia: General | Laterality: Right

## 2023-01-17 MED ORDER — FENTANYL CITRATE (PF) 100 MCG/2ML IJ SOLN: 25.0000 ug | INTRAMUSCULAR | Status: DC | PRN

## 2023-01-17 MED ORDER — LACTATED RINGERS IV SOLN: INTRAVENOUS | Status: DC

## 2023-01-17 MED ORDER — CHLORHEXIDINE GLUCONATE 0.12 % MT SOLN
OROMUCOSAL | Status: AC
  Administered 2023-01-17: 15 mL via OROMUCOSAL
  Filled 2023-01-17: qty 15

## 2023-01-17 MED ORDER — ONDANSETRON HCL 4 MG/2ML IJ SOLN
INTRAMUSCULAR | Status: DC | PRN
  Administered 2023-01-17: 4 mg via INTRAVENOUS

## 2023-01-17 MED ORDER — ONDANSETRON HCL 4 MG/2ML IJ SOLN: 4.0000 mg | Freq: Once | INTRAMUSCULAR | Status: DC | PRN

## 2023-01-17 MED ORDER — PHENOL 1.4 % MT LIQD
1.0000 | OROMUCOSAL | Status: DC | PRN
  Filled 2023-01-17: qty 177

## 2023-01-17 MED ORDER — FENTANYL CITRATE (PF) 250 MCG/5ML IJ SOLN: INTRAMUSCULAR | Status: DC | PRN

## 2023-01-17 MED ORDER — CHLORHEXIDINE GLUCONATE 0.12 % MT SOLN: 15.0000 mL | OROMUCOSAL | Status: AC

## 2023-01-17 MED ORDER — FENTANYL CITRATE (PF) 100 MCG/2ML IJ SOLN
INTRAMUSCULAR | Status: DC | PRN
  Administered 2023-01-17 (×2): 50 ug via INTRAVENOUS

## 2023-01-17 MED ORDER — ROCURONIUM BROMIDE 10 MG/ML (PF) SYRINGE
PREFILLED_SYRINGE | INTRAVENOUS | Status: DC | PRN
  Administered 2023-01-17: 50 mg via INTRAVENOUS

## 2023-01-17 MED ORDER — PROPOFOL 10 MG/ML IV BOLUS
INTRAVENOUS | Status: DC | PRN
  Administered 2023-01-17: 140 mg via INTRAVENOUS

## 2023-01-17 MED ORDER — ACETAMINOPHEN 500 MG PO TABS
1000.0000 mg | ORAL_TABLET | Freq: Once | ORAL | Status: AC
  Administered 2023-01-17: 1000 mg via ORAL
  Filled 2023-01-17: qty 2

## 2023-01-17 MED ORDER — MIDAZOLAM HCL 2 MG/2ML IJ SOLN
INTRAMUSCULAR | Status: DC | PRN
  Administered 2023-01-17 (×2): 1 mg via INTRAVENOUS

## 2023-01-17 MED ORDER — DEXAMETHASONE SODIUM PHOSPHATE 10 MG/ML IJ SOLN
INTRAMUSCULAR | Status: DC | PRN
  Administered 2023-01-17: 10 mg via INTRAVENOUS

## 2023-01-17 MED ORDER — LIDOCAINE 2% (20 MG/ML) 5 ML SYRINGE
INTRAMUSCULAR | Status: DC | PRN
  Administered 2023-01-17: 80 mg via INTRAVENOUS

## 2023-01-17 MED ORDER — PROPOFOL 500 MG/50ML IV EMUL
INTRAVENOUS | Status: DC | PRN
  Administered 2023-01-17: 100 ug/kg/min via INTRAVENOUS

## 2023-01-17 NOTE — Anesthesia Preprocedure Evaluation (Addendum)
Anesthesia Evaluation  Patient identified by MRN, date of birth, ID band Patient awake    Reviewed: Allergy & Precautions, NPO status , Patient's Chart, lab work & pertinent test results  History of Anesthesia Complications (+) PONV and history of anesthetic complications  Airway Mallampati: II  TM Distance: >3 FB Neck ROM: Full    Dental  (+) Dental Advisory Given, Caps, Teeth Intact   Pulmonary former smoker right hylor lung mass   Pulmonary exam normal breath sounds clear to auscultation       Cardiovascular negative cardio ROS Normal cardiovascular exam Rhythm:Regular Rate:Normal     Neuro/Psych  Headaches PSYCHIATRIC DISORDERS Anxiety      Neuromuscular disease    GI/Hepatic Neg liver ROS,GERD  ,,  Endo/Other  negative endocrine ROS    Renal/GU negative Renal ROS     Musculoskeletal  (+) Arthritis ,    Abdominal   Peds  Hematology negative hematology ROS (+)   Anesthesia Other Findings Day of surgery medications reviewed with the patient.  H/o L breast cancer   Reproductive/Obstetrics                             Anesthesia Physical Anesthesia Plan  ASA: 2  Anesthesia Plan: General   Post-op Pain Management: Tylenol PO (pre-op)*   Induction: Intravenous  PONV Risk Score and Plan: 4 or greater and TIVA, Treatment may vary due to age or medical condition, Dexamethasone and Ondansetron  Airway Management Planned: Oral ETT  Additional Equipment:   Intra-op Plan:   Post-operative Plan: Extubation in OR  Informed Consent: I have reviewed the patients History and Physical, chart, labs and discussed the procedure including the risks, benefits and alternatives for the proposed anesthesia with the patient or authorized representative who has indicated his/her understanding and acceptance.     Dental advisory given  Plan Discussed with: CRNA  Anesthesia Plan Comments:          Anesthesia Quick Evaluation

## 2023-01-17 NOTE — Transfer of Care (Signed)
Immediate Anesthesia Transfer of Care Note  Patient: Monique [name]  Procedure(s) Performed: VIDEO BRONCHOSCOPY WITH ENDOBRONCHIAL ULTRASOUND (Right) BRONCHIAL BRUSHINGS BRONCHIAL BIOPSIES  Patient Location: PACU  Anesthesia Type:General  Level of Consciousness: awake, alert , and oriented  Airway & Oxygen Therapy: Patient Spontanous Breathing and Patient connected to face mask oxygen  Post-op Assessment: Report given to RN, Post -op Vital signs reviewed and stable, and Patient moving all extremities  Post vital signs: Reviewed and stable  Last Vitals:  Vitals Value Taken Time  BP 143/78 01/17/23 1200  Temp 36.3 C 01/17/23 1157  Pulse 72 01/17/23 1205  Resp 15 01/17/23 1205  SpO2 95 % 01/17/23 1205  Vitals shown include unvalidated device data.  Last Pain:  Vitals:   01/17/23 1157  TempSrc:   PainSc: 0-No pain      Patients Stated Pain Goal: 3 (40/81/44 8185)  Complications: No notable events documented.

## 2023-01-17 NOTE — Anesthesia Procedure Notes (Addendum)
Procedure Name: Intubation Date/Time: 01/17/2023 11:18 AM  Performed by: Mariea Clonts, CRNAPre-anesthesia Checklist: Patient identified, Emergency Drugs available, Suction available and Patient being monitored Patient Re-evaluated:Patient Re-evaluated prior to induction Oxygen Delivery Method: Circle System Utilized Preoxygenation: Pre-oxygenation with 100% oxygen Induction Type: IV induction Ventilation: Mask ventilation without difficulty Laryngoscope Size: Glidescope and 3 Grade View: Grade II Tube type: Oral Tube size: 8.5 mm Number of attempts: 3 Airway Equipment and Method: Stylet and Oral airway Placement Confirmation: ETT inserted through vocal cords under direct vision, positive ETCO2 and breath sounds checked- equal and bilateral Tube secured with: Tape Dental Injury: Teeth and Oropharynx as per pre-operative assessment  Comments: Grade 2 view with miller 2 with head lift and cricoid pressure but unable to pass large oett.  Glidescope utilized.

## 2023-01-17 NOTE — Op Note (Signed)
Video Bronchoscopy with Endobronchial Ultrasound Procedure Note Bronchoscopy with endobronchial biopsies, endobronchial brushings  Date of Operation: 01/17/2023  Pre-op Diagnosis: Right upper lobe nodule  Post-op Diagnosis: Right upper lobe nodule  Surgeon: Garner Nash, DO  Assistants: None   Anesthesia: General endotracheal anesthesia  Operation: Flexible video fiberoptic bronchoscopy with endobronchial ultrasound and biopsies.  Estimated Blood Loss: Minimal  Complications: None   Indications and History: [name] is a 67 y.o. female with right upper lobe nodule.  The risks, benefits, complications, treatment options and expected outcomes were discussed with the patient.  The possibilities of pneumothorax, pneumonia, reaction to medication, pulmonary aspiration, perforation of a viscus, bleeding, failure to diagnose a condition and creating a complication requiring transfusion or operation were discussed with the patient who freely signed the consent.    Description of Procedure: The patient was examined in the preoperative area and history and data from the preprocedure consultation were reviewed. It was deemed appropriate to proceed.  The patient was taken to [facility] endoscopy room 3, identified as Monique [name] and the procedure verified as Flexible Video Fiberoptic Bronchoscopy.  A Time Out was held and the above information confirmed. After being taken to the operating room general anesthesia was initiated and the patient  was orally intubated. The video fiberoptic bronchoscope was introduced via the endotracheal tube and a general inspection was performed which showed occlusion of the right upper lobe anterior segment with irregular mucosa along the medial portion of the right upper lobe anterior wall.  The standard scope was then withdrawn and the endobronchial ultrasound was used to identify and characterize the peritracheal, hilar and bronchial lymph nodes. Inspection  showed normal adenopathy.  We were unable to get the curvilinear endobronchial ultrasound scope up into the right upper lobe to achieve enough deposition of the ultrasound against the airway wall to delineate any lesion.  No samples were obtained using the ultrasound scope.  Decision was made to use a standard therapeutic bronchoscope.  Using the cytology brushings were obtained samples from the right upper lobe anterior segment.  We also used a 2.0 [employer] forceps to obtain endobronchial biopsies from the right upper lobe.  The patient tolerated the procedure well without apparent complications. There was no significant blood loss. The bronchoscope was withdrawn. Anesthesia was reversed and the patient was taken to the PACU for recovery.   Samples: 1.  Endobronchial brushing right upper lobe 2.  Endobronchial forcep biopsies right upper lobe  Plans:  The patient will be discharged from the PACU to home when recovered from anesthesia. We will review the cytology, pathology results with the patient when they become available. Outpatient followup will be with Garner Nash, DO.   Garner Nash, DO [facility] 01/17/2023 11:51 AM

## 2023-01-17 NOTE — Interval H&P Note (Signed)
History and Physical Interval Note:  01/17/2023 11:01 AM  Monique [name]  has presented today for surgery, with the diagnosis of right hylor lung mass.  The various methods of treatment have been discussed with the patient and family. After consideration of risks, benefits and other options for treatment, the patient has consented to  Procedure(s): ROBOTIC ASSISTED NAVIGATIONAL BRONCHOSCOPY (Right) as a surgical intervention.  The patient's history has been reviewed, patient examined, no change in status, stable for surgery.  I have reviewed the patient's chart and labs.  Questions were answered to the patient's satisfaction.     [place]

## 2023-01-17 NOTE — Telephone Encounter (Signed)
Scheduled appt per 1/30 referral. Pt is aware of appt date and time. Pt is aware to arrive 15 mins prior to appt time and to bring and updated insurance card. Pt is aware of appt location.    Pt did seem confused why we were scheduling her appt. She stated she just had her biopsy done today and wasn't told she has cancer or not. She requested someone from the clinical team reach out to her. I messaged nurse navigator and requested for her to reach out to pt for further explanation.

## 2023-01-17 NOTE — Discharge Instructions (Signed)

## 2023-01-18 ENCOUNTER — Encounter (HOSPITAL_COMMUNITY): Payer: Self-pay | Admitting: Pulmonary Disease

## 2023-01-18 NOTE — Anesthesia Postprocedure Evaluation (Signed)
Anesthesia Post Note  Patient: Monique [name]  Procedure(s) Performed: VIDEO BRONCHOSCOPY WITH ENDOBRONCHIAL ULTRASOUND (Right) BRONCHIAL BRUSHINGS BRONCHIAL BIOPSIES     Patient location during evaluation: PACU Anesthesia Type: General Level of consciousness: awake and alert Pain management: pain level controlled Vital Signs Assessment: post-procedure vital signs reviewed and stable Respiratory status: spontaneous breathing, nonlabored ventilation, respiratory function stable and patient connected to nasal cannula oxygen Cardiovascular status: blood pressure returned to baseline and stable Postop Assessment: no apparent nausea or vomiting Anesthetic complications: no   No notable events documented.  Last Vitals:  Vitals:   01/17/23 1212 01/17/23 1227  BP: (!) 143/78 (!) 146/82  Pulse: 72 66  Resp: 18 12  Temp:  (!) 36.3 C  SpO2: 93% 97%    Last Pain:  Vitals:   01/17/23 1227  TempSrc:   PainSc: 5    Pain Goal: Patients Stated Pain Goal: 3 (01/17/23 1227)                 [place]

## 2023-01-20 ENCOUNTER — Encounter: Payer: Self-pay | Admitting: Pulmonary Disease

## 2023-01-20 NOTE — Telephone Encounter (Signed)
Dr. Valeta Harms,  Please see note from patient:  Monique [name] [facility] (supporting Garner Nash, DO)10 minutes ago (9:30 AM)    do I have to wait until my appointment with [name] to get the results or can you tell me via my chart? I noticed I have no follow up visits at your office. I am comfortable finding out via my chart. Monique [name]   Please advise.  Thank you.

## 2023-01-23 NOTE — Progress Notes (Signed)
I tried calling the patient to let her know her final pathology results.  Pathology results from biopsy were consistent with metastatic breast carcinoma.   Thanks,  BLI  Garner Nash, DO [facility] 01/23/2023 10:09 AM

## 2023-01-24 ENCOUNTER — Telehealth: Payer: Self-pay | Admitting: Hematology

## 2023-01-24 ENCOUNTER — Encounter: Payer: Self-pay | Admitting: Pulmonary Disease

## 2023-01-24 NOTE — Telephone Encounter (Signed)
Dr. Valeta Harms, please advise on pt's message regarding her oncology appt. Thanks.

## 2023-01-24 NOTE — Telephone Encounter (Signed)
Spoke to pt who did not want to sch with Dr. Chryl Heck or Dr. Lindi Adie. She requested to schedule with Dr. Burr Medico. Scheduled appt per 2/5 staff msg from Dr. Julien Nordmann stating pt would need to see a breast cancer MD. Pt is aware of appt date/time.

## 2023-01-25 ENCOUNTER — Inpatient Hospital Stay: Payer: Medicare Other | Admitting: Internal Medicine

## 2023-01-25 ENCOUNTER — Inpatient Hospital Stay: Payer: Medicare Other

## 2023-01-27 ENCOUNTER — Encounter: Payer: Self-pay | Admitting: *Deleted

## 2023-01-27 ENCOUNTER — Inpatient Hospital Stay: Payer: Medicare Other

## 2023-01-27 ENCOUNTER — Encounter: Payer: Self-pay | Admitting: Oncology

## 2023-01-27 ENCOUNTER — Inpatient Hospital Stay: Payer: Medicare Other | Attending: Oncology | Admitting: Oncology

## 2023-01-27 ENCOUNTER — Other Ambulatory Visit: Payer: Self-pay | Admitting: *Deleted

## 2023-01-27 VITALS — BP 129/70 | HR 67 | Temp 97.9°F | Ht 62.0 in | Wt 129.0 lb

## 2023-01-27 DIAGNOSIS — Z9012 Acquired absence of left breast and nipple: Secondary | ICD-10-CM | POA: Insufficient documentation

## 2023-01-27 DIAGNOSIS — Z79811 Long term (current) use of aromatase inhibitors: Secondary | ICD-10-CM | POA: Diagnosis not present

## 2023-01-27 DIAGNOSIS — Z7982 Long term (current) use of aspirin: Secondary | ICD-10-CM | POA: Insufficient documentation

## 2023-01-27 DIAGNOSIS — C50919 Malignant neoplasm of unspecified site of unspecified female breast: Secondary | ICD-10-CM

## 2023-01-27 DIAGNOSIS — Z853 Personal history of malignant neoplasm of breast: Secondary | ICD-10-CM

## 2023-01-27 DIAGNOSIS — Z87891 Personal history of nicotine dependence: Secondary | ICD-10-CM | POA: Insufficient documentation

## 2023-01-27 DIAGNOSIS — C50412 Malignant neoplasm of upper-outer quadrant of left female breast: Secondary | ICD-10-CM | POA: Diagnosis not present

## 2023-01-27 DIAGNOSIS — Z170 Estrogen receptor positive status [ER+]: Secondary | ICD-10-CM | POA: Diagnosis not present

## 2023-01-27 DIAGNOSIS — Z79899 Other long term (current) drug therapy: Secondary | ICD-10-CM | POA: Diagnosis not present

## 2023-01-27 DIAGNOSIS — C7800 Secondary malignant neoplasm of unspecified lung: Secondary | ICD-10-CM | POA: Diagnosis not present

## 2023-01-27 LAB — COMPREHENSIVE METABOLIC PANEL
ALT: 14 U/L (ref 0–44)
AST: 21 U/L (ref 15–41)
Albumin: 4.4 g/dL (ref 3.5–5.0)
Alkaline Phosphatase: 58 U/L (ref 38–126)
Anion gap: 10 (ref 5–15)
BUN: 11 mg/dL (ref 8–23)
CO2: 27 mmol/L (ref 22–32)
Calcium: 9.1 mg/dL (ref 8.9–10.3)
Chloride: 97 mmol/L — ABNORMAL LOW (ref 98–111)
Creatinine, Ser: 0.69 mg/dL (ref 0.44–1.00)
GFR, Estimated: >60 mL/min (ref >60)
Glucose, Bld: 84 mg/dL (ref 70–99)
Potassium: 3.7 mmol/L (ref 3.5–5.1)
Sodium: 134 mmol/L — ABNORMAL LOW (ref 135–145)
Total Bilirubin: 0.6 mg/dL (ref 0.3–1.2)
Total Protein: 8 g/dL (ref 6.5–8.1)

## 2023-01-27 LAB — CBC WITH DIFFERENTIAL/PLATELET
Abs Immature Granulocytes: 0.03 10*3/uL (ref 0.00–0.07)
Basophils Absolute: 0 10*3/uL (ref 0.0–0.1)
Basophils Relative: 1 %
Eosinophils Absolute: 0.3 10*3/uL (ref 0.0–0.5)
Eosinophils Relative: 3 %
HCT: 39.3 % (ref 36.0–46.0)
Hemoglobin: 12.7 g/dL (ref 12.0–15.0)
Immature Granulocytes: 0 %
Lymphocytes Relative: 35 %
Lymphs Abs: 2.8 10*3/uL (ref 0.7–4.0)
MCH: 28 pg (ref 26.0–34.0)
MCHC: 32.3 g/dL (ref 30.0–36.0)
MCV: 86.6 fL (ref 80.0–100.0)
Monocytes Absolute: 0.5 10*3/uL (ref 0.1–1.0)
Monocytes Relative: 6 %
Neutro Abs: 4.5 10*3/uL (ref 1.7–7.7)
Neutrophils Relative %: 55 %
Platelets: 316 10*3/uL (ref 150–400)
RBC: 4.54 MIL/uL (ref 3.87–5.11)
RDW: 13.1 % (ref 11.5–15.5)
WBC: 8.1 10*3/uL (ref 4.0–10.5)
nRBC: 0 % (ref 0.0–0.2)

## 2023-01-27 MED ORDER — LETROZOLE 2.5 MG PO TABS: 2.5000 mg | ORAL_TABLET | Freq: Every day | ORAL | 3 refills | Status: DC

## 2023-01-27 NOTE — Progress Notes (Signed)
[facility] pathology lab called this morning to add on ER/PR/Her2 testing to lung biopsy.  [employer] order/requisition faxed along with insurance, path report and demographics.

## 2023-01-28 LAB — CANCER ANTIGEN 27.29: CA 27.29: 18.6 U/mL (ref 0.0–38.6)

## 2023-01-29 DIAGNOSIS — C50919 Malignant neoplasm of unspecified site of unspecified female breast: Secondary | ICD-10-CM | POA: Insufficient documentation

## 2023-01-29 LAB — CANCER ANTIGEN 15-3: CA 15-3: 15.1 U/mL (ref 0.0–25.0)

## 2023-01-29 NOTE — Progress Notes (Signed)
Hematology/Oncology Consult note [facility] Telephone:([phone] [phone]  Patient Care Team: Susy Frizzle, MD as PCP - General (Family Medicine) Buford Dresser, MD as PCP - Cardiology (Cardiology) Janyth Pupa, DO as Consulting Physician (Obstetrics and Gynecology) Alphonsa Overall, MD as Consulting Physician (General Surgery) Magrinat, Virgie Dad, MD (Inactive) as Consulting Physician (Oncology) Kyung Rudd, MD as Consulting Physician (Radiation Oncology)   Name of the patient: Monique [name]  [id number]  09/15/1956    Reason for referral-new diagnosis of metastatic breast cancer   Referring physician-Dr. Valeta Harms  Date of visit: 01/29/23   History of presenting illness- Patient is a 67 year old female who was diagnosed with stage I T2 N0 M0 left breast cancer in May 2018.  This was ER 100% positive, PR 95% positive HER2 negative Ki-67 15%.  She underwent left mastectomy and did not require any adjuvant radiation therapy.  Oncotype score came back low at 5 and therefore did not require any adjuvant chemotherapy.  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  Patient was initially started on Arimidex which she could not tolerate due to dizziness and was switched to tamoxifen which she could also not tolerate and subsequently stopped taking endocrine therapy shortly.  She was seen by both Dr. Lindi Adie and Dr. Burr Medico at [facility] back in 2018 and was subsequently lost to follow-up  Patient has been seeing Dr. Valeta Harms this for evaluation of bilateral lung nodules and abnormal lung cancer screening CT noted in March 2023.  At that time she had a hypermetabolism with an SUV of 6 in the right hilum.  She had a bronchoscopy done and biopsies of the left bronchoscopy were negative as well as that of the hilum was negative.  There was appearance of a right lower lobe lung nodule which was being followed given that it was spiculated.  There  was a plan for repeat bronchoscopy in November 2023 but Dr. Valeta Harms was sick at that time and patient did not wish to get bronchoscopy by any other pulmonary.  Bronchoscopy was therefore delayed and patient had PET CT scan in January 2024.  PET scan showed intense hypermetabolic activity again in the right hilum with no clear lesion.  Findings concerning for neoplasm obstructing the bronchus with the right middle lobe atelectasis.  Low level of metabolic activity in the right lower lobe.  No other evidence of distant metastatic disease.  Prior to that in October 2023 patient was noted to have bilateral lung nodules varying from 4 to 8 mm in size which could be potentially metastatic.  Had repeat bronchoscopy on 01/17/2023.  Right upper lobe endobronchial lesion brushing was positive for metastatic breast carcinoma.  Tumor cells positive for GATA3 and ER.  Negative for CK5/6, p40, TTF-1, chromogranin, Napsin synaptophysin and CD56.  Patient is consistent with metastatic carcinoma of breast origin.  Patient referred for further management  ECOG PS- 1  Pain scale- 0   Review of systems- Review of Systems  Constitutional:  Positive for malaise/fatigue. Negative for chills, fever and weight loss.  HENT:  Negative for congestion, ear discharge and nosebleeds.   Eyes:  Negative for blurred vision.  Respiratory:  Negative for cough, hemoptysis, sputum production, shortness of breath and wheezing.   Cardiovascular:  Negative for chest pain, palpitations, orthopnea and claudication.  Gastrointestinal:  Negative for abdominal pain, blood in stool, constipation, diarrhea, heartburn, melena, nausea and vomiting.  Genitourinary:  Negative for dysuria, flank pain, frequency, hematuria and urgency.  Musculoskeletal:  Negative for back pain, joint pain and myalgias.  Skin:  Negative for rash.  Neurological:  Negative for dizziness, tingling, focal weakness, seizures, weakness and headaches.  Endo/Heme/Allergies:   Does not bruise/bleed easily.  Psychiatric/Behavioral:  Negative for depression and suicidal ideas. The patient does not have insomnia.     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   Pain in female genitalia on intercourse 12/02/2021   Personal history of malignant neoplasm of breast 12/02/2021   Psoriasis 12/02/2021   Rectal bleeding 12/02/2021   Osteopenia    Insomnia 11/20/2017   GERD (gastroesophageal reflux disease) 11/20/2017   Cancer of left breast, stage 2 ([place]) 08/03/2017   Genetic testing 06/08/2017   Family history of breast cancer    Family history of prostate cancer    Family history of thyroid cancer    Malignant neoplasm of upper-outer quadrant of left breast in female, estrogen receptor positive ([place]) 05/24/2017   Mnire's disease 06/17/2016   PERIPHERAL NEUROPATHY 03/15/2010   RENAL CYST 03/15/2010   FATIGUE 03/15/2010   NAUSEA 03/15/2010   ABDOMINAL BLOATING 03/15/2010   ABDOMINAL PAIN, UNSPECIFIED SITE 03/15/2010   Tinnitus, left ear 06/24/1986     Past Medical History:  Diagnosis Date   Abnormal EKG    Anxiety    Phreesia 11/29/2020   Arthritis    Phreesia 11/29/2020   Barrett's esophagus    Breast cancer ([place]) 2018   Left breast   Cancer ([place])    Phreesia 10/04/5101   Complication of anesthesia    Family history of breast cancer    Family history of prostate cancer    Family history of thyroid cancer    Fatigue    Generalized headaches    headaches are less since using a mouth piece to prevent grinding of teeth   Hepatitis    as a teenager   IBS (irritable bowel syndrome)    Meniere disease     Osteopenia    Pneumonia    PONV (postoperative nausea and vomiting)    Psoriasis    Tinnitus    left ear     Past Surgical History:  Procedure Laterality Date   BREAST SURGERY N/A    Phreesia 11/29/2020   BRONCHIAL BIOPSY  01/17/2023   Procedure: BRONCHIAL BIOPSIES;  Surgeon: Garner Nash, DO;  Location: [place] [facility];  Service: Pulmonary;;   BRONCHIAL BRUSHINGS  01/17/2023   Procedure: BRONCHIAL BRUSHINGS;  Surgeon: Garner Nash, DO;  Location: [place] [facility];  Service: Pulmonary;;   DILATION AND CURETTAGE OF UTERUS     FINE NEEDLE ASPIRATION  05/10/2022   Procedure: FINE NEEDLE ASPIRATION (FNA) LINEAR;  Surgeon: Garner Nash, DO;  Location: [place] [facility];  Service: Pulmonary;;   MASTECTOMY Left 2018   MASTECTOMY W/ SENTINEL NODE BIOPSY Left 08/03/2017   Procedure: LEFT MASTECTOMY WITH LEFT AXILLARY SENTINEL LYMPH NODE BIOPSY;  Surgeon: Alphonsa Overall, MD;  Location: [place];  Service: General;  Laterality: Left;   skin cancer removal     TONSILLECTOMY     VIDEO BRONCHOSCOPY WITH ENDOBRONCHIAL ULTRASOUND N/A 05/10/2022  Procedure: VIDEO BRONCHOSCOPY WITH ENDOBRONCHIAL ULTRASOUND;  Surgeon: Garner Nash, DO;  Location: [place];  Service: Pulmonary;  Laterality: N/A;   VIDEO BRONCHOSCOPY WITH ENDOBRONCHIAL ULTRASOUND Right 01/17/2023   Procedure: VIDEO BRONCHOSCOPY WITH ENDOBRONCHIAL ULTRASOUND;  Surgeon: Garner Nash, DO;  Location: [place];  Service: Pulmonary;  Laterality: Right;    Social History   Socioeconomic History   Marital status: Married    Spouse name: Not on file   Number of children: Not on file   Years of education: Not on file   Highest education level: Not on file  Occupational History   Not on file  Tobacco Use   Smoking status: Former    Packs/day: 0.50    Types: Cigarettes    Quit date: 2018    Years since quitting: 6.1    Passive exposure: Past   Smokeless tobacco: Never  Vaping Use   Vaping Use: Never  used  Substance and Sexual Activity   Alcohol use: Yes    Comment: occas   Drug use: Never   Sexual activity: Not Currently    Birth control/protection: Post-menopausal  Other Topics Concern   Not on file  Social History Narrative   Not on file   Social Determinants of Health   Financial Resource Strain: Low Risk  (01/11/2022)   Overall Financial Resource Strain (CARDIA)    Difficulty of Paying Living Expenses: Not very hard  Food Insecurity: No Food Insecurity (01/27/2023)   Hunger Vital Sign    Worried About Running Out of Food in the Last Year: Never true    Ran Out of Food in the Last Year: Never true  Transportation Needs: No Transportation Needs (01/11/2022)   PRAPARE - Hydrologist (Medical): No    Lack of Transportation (Non-Medical): No  Physical Activity: Sufficiently Active (01/11/2022)   Exercise Vital Sign    Days of Exercise per Week: 5 days    Minutes of Exercise per Session: 30 min  Stress: Stress Concern Present (01/11/2022)   [place]    Feeling of Stress : Rather much  Social Connections: Moderately Isolated (01/11/2022)   Social Connection and Isolation Panel [NHANES]    Frequency of Communication with Friends and Family: More than three times a week    Frequency of Social Gatherings with Friends and Family: Once a week    Attends Religious Services: Never    Marine scientist or Organizations: No    Attends Archivist Meetings: Never    Marital Status: Married  Human resources officer Violence: Unknown (01/27/2023)   Humiliation, Afraid, Rape, and Kick questionnaire    Fear of Current or Ex-Partner: No    Emotionally Abused: No    Physically Abused: No    Sexually Abused: Not on file     Family History  Problem Relation Age of Onset   Hypertension Mother    Heart attack Father    Hypertension Father    Healthy Maternal Aunt    Healthy Maternal Uncle     Breast cancer Paternal Aunt 69       bilateral   Healthy Paternal Aunt    Prostate cancer Paternal Uncle    Thyroid cancer Paternal Uncle    Healthy Paternal Uncle    Breast cancer Cousin 27       paternal first cousin   Breast cancer Cousin        paternal first cousin  Colon cancer Neg Hx    Esophageal cancer Neg Hx    Rectal cancer Neg Hx    Stomach cancer Neg Hx      Current Outpatient Medications:    APPLE CIDER VINEGAR PO, Take 1 capsule by mouth daily., Disp: , Rfl:    CINNAMON PO, Take 1 capsule by mouth daily., Disp: , Rfl:    Coenzyme Q10 (COQ10 PO), Take 400 mg by mouth daily., Disp: , Rfl:    diazepam (VALIUM) 10 MG tablet, TAKE ONE TABLET BY MOUTH EVERY 12 HOURS AS NEEDED FOR ANXIETY (Patient taking differently: Take 5 mg by mouth at bedtime.), Disp: 30 tablet, Rfl: 1   FIBER ADULT GUMMIES PO, Take 2 tablets by mouth daily. Vitafusion Fiber Well Gummies, Disp: , Rfl:    letrozole (FEMARA) 2.5 MG tablet, Take 1 tablet (2.5 mg total) by mouth daily., Disp: 30 tablet, Rfl: 3   Multiple Vitamins-Minerals (AIRBORNE PO), Take 1 tablet by mouth daily as needed (immune support)., Disp: , Rfl:    TURMERIC CURCUMIN PO, Take 1 capsule by mouth daily., Disp: , Rfl:    aspirin EC 81 MG tablet, Take 1 tablet (81 mg total) by mouth daily. Swallow whole. (Patient not taking: Reported on 01/27/2023), Disp: 90 tablet, Rfl: 3   atorvastatin (LIPITOR) 40 MG tablet, Take 1 tablet (40 mg total) by mouth daily. (Patient not taking: Reported on 01/27/2023), Disp: 90 tablet, Rfl: 3  Current Facility-Administered Medications:    0.9 %  sodium chloride infusion, 500 mL, Intravenous, Once, Daryel November, MD   Physical exam:  Vitals:   01/27/23 1413  BP: 129/70  Pulse: 67  Temp: 97.9 F (36.6 C)  TempSrc: Tympanic  SpO2: 99%  Weight: 129 lb (58.5 kg)  Height: 5\' 2"  (1.575 m)   Physical Exam Cardiovascular:     Rate and Rhythm: Normal rate and regular rhythm.     Heart sounds:  Normal heart sounds.  Pulmonary:     Effort: Pulmonary effort is normal.     Breath sounds: Normal breath sounds.  Abdominal:     General: Bowel sounds are normal.     Palpations: Abdomen is soft.  Skin:    General: Skin is warm and dry.  Neurological:     Mental Status: She is alert and oriented to person, place, and time.           Latest Ref Rng & Units 01/27/2023    3:30 PM  CMP  Glucose 70 - 99 mg/dL 84   BUN 8 - 23 mg/dL 11   Creatinine 0.44 - 1.00 mg/dL 0.69   Sodium 135 - 145 mmol/L 134   Potassium 3.5 - 5.1 mmol/L 3.7   Chloride 98 - 111 mmol/L 97   CO2 22 - 32 mmol/L 27   Calcium 8.9 - 10.3 mg/dL 9.1   Total Protein 6.5 - 8.1 g/dL 8.0   Total Bilirubin 0.3 - 1.2 mg/dL 0.6   Alkaline Phos 38 - 126 U/L 58   AST 15 - 41 U/L 21   ALT 0 - 44 U/L 14       Latest Ref Rng & Units 01/27/2023    3:30 PM  CBC  WBC 4.0 - 10.5 K/uL 8.1   Hemoglobin 12.0 - 15.0 g/dL 12.7   Hematocrit 36.0 - 46.0 % 39.3   Platelets 150 - 400 K/uL 316     No images are attached to the encounter.  No results found.  Assessment and plan-  Patient is a 67 y.o. female with history of stage I T2 N0 M0 ER PR strongly positive and HER2 negative breast cancer in 2018 s/p left mastectomy.  Patient did not require adjuvant chemotherapy and radiation treatment and could not tolerate endocrine therapy.  Now referred for endobronchial lesion that is consistent with metastatic breast primary  Discussed with the patient that bronchoscopy from right bronchus brushings is consistent with metastatic adenocarcinoma of breast primary.  Tumor was positive for GATA3 and ER and negative for lung markers.  She does not have metastatic disease elsewhere but does have small bilateral pulmonary nodules ranging from 4 to 8 mm which are also potentially metastatic.  She has low-volume disease and likely this has been ongoing for close to 6 to 7 months given that she had hypermetabolism in the right hilum even back in May  2023 although biopsy at that time was negative.  I am ordering ER/PR and HER2 testing on her bronchoscopy specimen.  Given that the tumor was ER positive by stain I am starting her on letrozole while I formally await ER/PR and HER2 testing results.  I am also sending the tumor off for NGS testing.  I will tentatively see the patient back in 2 weeks time after ER/PR and HER2 testing is back to discuss further management.  If her tumor is again ER/PR positive and HER2 negative I will consider adding Ibrance for her.  Given that she has not tolerated treatments well in the past I will start off at the lowest dose of Ibrance.  Discussed risks and benefits of letrozole including all but not limited to hot flashes, mood swings, arthralgias and worsening bone health.  Treatment will be given with a palliative intent.  Patient understands and agrees to proceed as planned.  I will check CBC with differential CMP CA 27-29 and CA 15-3 today.   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 [facility]) Staging form: Breast, AJCC 8th Edition - Clinical stage from 01/29/2023: Stage IV (pM1, ER+, PR: Unknown, HER2: Unknown) - Signed by Sindy Guadeloupe, MD on 01/29/2023 Stage prefix: Recurrence  Malignant neoplasm of upper-outer quadrant of left breast in female, estrogen receptor positive ([place]) Staging form: Breast, AJCC 8th Edition - Clinical stage from 05/24/2017: Stage IB (cT2, cN0, cM0, G2, ER+, PR+, HER2-) - Unsigned Histologic grading system: 3 grade system Laterality: Left Staged by: Pathologist and managing physician Stage used in treatment planning: Yes National guidelines used in treatment planning: Yes Type of national guideline used in treatment planning: NCCN - Pathologic: Stage IA (pT2, pN0, cM0, G2, ER+, PR+, HER2-) - Unsigned Histologic grading system: 3 grade system     Thank you for this kind referral and the opportunity to participate in the care of this patient   Visit  Diagnosis 1. History of left breast cancer   2. 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 Randa Evens, MD, MPH [facility] [phone] 01/29/2023  1:45 PM

## 2023-01-30 ENCOUNTER — Other Ambulatory Visit: Payer: Medicare Other

## 2023-01-30 ENCOUNTER — Ambulatory Visit: Payer: Medicare Other | Admitting: Hematology and Oncology

## 2023-01-30 LAB — CYTOLOGY - NON PAP

## 2023-02-01 ENCOUNTER — Other Ambulatory Visit: Payer: Medicare Other | Admitting: Licensed Clinical Social Worker

## 2023-02-03 ENCOUNTER — Ambulatory Visit: Payer: Medicare Other | Admitting: Hematology

## 2023-02-08 ENCOUNTER — Inpatient Hospital Stay (HOSPITAL_BASED_OUTPATIENT_CLINIC_OR_DEPARTMENT_OTHER): Payer: Medicare Other | Admitting: Hospice and Palliative Medicine

## 2023-02-08 DIAGNOSIS — Z853 Personal history of malignant neoplasm of breast: Secondary | ICD-10-CM

## 2023-02-08 NOTE — Progress Notes (Signed)
Multidisciplinary Oncology Council Documentation  TAHJAE DURR was presented by our [facility] on 02/08/2023, which included representatives from:  Palliative Care Dietitian  Physical/Occupational Therapist Nurse Navigator Genetics Speech Therapist Social work Survivorship RN Financial Navigator Research RN   Tamsyn currently presents with history of breast cancer  We reviewed previous medical and familial history, history of present illness, and recent lab results along with all available histopathologic and imaging studies. The [place] considered available treatment options and made the following recommendations/referrals:  SW  The MOC is a meeting of clinicians from various specialty areas who evaluate and discuss patients for whom a multidisciplinary approach is being considered. Final determinations in the plan of care are those of the provider(s).   Today's extended care, comprehensive team conference, Hedy was not present for the discussion and was not examined.

## 2023-02-09 ENCOUNTER — Inpatient Hospital Stay: Payer: Medicare Other | Admitting: Licensed Clinical Social Worker

## 2023-02-09 DIAGNOSIS — C50919 Malignant neoplasm of unspecified site of unspecified female breast: Secondary | ICD-10-CM

## 2023-02-09 NOTE — Progress Notes (Addendum)
Monique [name]  Initial Assessment   Monique [name] is a 67 y.o. year old female contacted by phone. Clinical Social [name] was referred by medical provider for assessment of psychosocial needs.   SDOH (Social Determinants of Health) assessments performed: Yes   SDOH Screenings   Food Insecurity: No Food Insecurity (01/27/2023)  Housing: Medium Risk (01/11/2022)  Transportation Needs: No Transportation Needs (01/11/2022)  Utilities: Not At Risk (01/27/2023)  Alcohol Screen: Low Risk  (01/11/2022)  Depression (PHQ2-9): Low Risk  (01/27/2023)  Recent Concern: Depression (PHQ2-9) - Medium Risk (12/02/2022)  Financial Resource Strain: Low Risk  (01/11/2022)  Physical Activity: Sufficiently Active (01/11/2022)  Social Connections: Moderately Isolated (01/11/2022)  Stress: Stress Concern Present (01/11/2022)  Tobacco Use: Medium Risk (01/27/2023)     Distress Screen completed: Yes    05/24/2017   12:27 PM  ONCBCN DISTRESS SCREENING  Screening Type Initial Screening  Distress experienced in past week (1-10) 6  Family Problem type Partner  Emotional problem type Nervousness/Anxiety  Spiritual/Religous concerns type Relating to God  Information Concerns Type Lack of info about diagnosis;Lack of info about treatment;Lack of info about complementary therapy choices  Physical Problem type Pain;Loss of appetitie;Constipation/diarrhea;Sexual problems  Physician notified of physical symptoms Yes  Referral to support programs Yes      Family/Social Information:  Housing Arrangement: patient lives with spouse,  Monique [name] [phone]  Family members/support persons in your life? Family and Medical Staff Transportation concerns: no  Employment: Retired  .  Income source: Paediatric nurse concerns: Yes, current concerns Type of concern: Rent/ mortgage, Medical bills, and Food Food access concerns: no, immediate concerns present, but patient is concerned with long term  cost of treatment and ability to afford basic needs. Religious or spiritual practice: Not known Services Currently in place:  [facility]  Coping/ Adjustment to diagnosis: Patient understands treatment plan and what happens next? Patient is scheduled to speak with oncologist to discuss diagnosis and treatment plan. Concerns about diagnosis and/or treatment: How I will care for other members of my family, How I will pay for the services I need, How will I care for myself, and Quality of life Patient reported stressors: Finances, Food, Adjusting to my illness, Physical issues, and caring for spouse and long-term independence. Hopes and/or priorities: maintaining independence as long as possible. Patient enjoys  N/A Current coping skills/ strengths: Average or above average intelligence , Capable of independent living , Communication skills , Supportive family/friends , and [name] skills     SUMMARY: Current SDOH Barriers:  Financial constraints related to fixed income, Limited social support, and ADL IADL limitations  Clinical Social [name] Clinical Goal(s):  Patient will follow up with Medicaid to discuss criteria* as directed by SW  Interventions: Discussed common feeling and emotions when being diagnosed with cancer, and the importance of support during treatment Informed patient of the support team roles and support services at [facility] Provided CSW contact information and encouraged patient to call with any questions or concerns Referred patient to grant fund and Provided patient with information about CSW role in patient care.  CSW discussed criteria for Medicaid as well as advance directives.  Patient is interested in discussing palliative care and information on hospice care, if the diagnosis will not improve regardless of treatment.  Patient inquired about service animal.  CSW stated I would research information on ADA certified service animals, I would contact her once I had more  information.   Follow Up Plan: Patient will contact CSW  with any support or resource needs and CSW will see patient on 03/02/2023 @ 10:00AM for Advance Directives. Patient verbalizes understanding of plan: Yes    [employer], LCSW

## 2023-02-13 ENCOUNTER — Other Ambulatory Visit: Payer: Self-pay

## 2023-02-13 ENCOUNTER — Encounter: Payer: Self-pay | Admitting: Oncology

## 2023-02-13 ENCOUNTER — Inpatient Hospital Stay: Payer: Medicare Other | Admitting: Oncology

## 2023-02-13 ENCOUNTER — Other Ambulatory Visit: Payer: Self-pay | Admitting: *Deleted

## 2023-02-13 ENCOUNTER — Telehealth: Payer: Self-pay | Admitting: Pharmacist

## 2023-02-13 ENCOUNTER — Other Ambulatory Visit (HOSPITAL_COMMUNITY): Payer: Self-pay

## 2023-02-13 ENCOUNTER — Telehealth: Payer: Self-pay

## 2023-02-13 VITALS — BP 123/67 | HR 63 | Temp 98.2°F | Resp 18 | Ht 62.0 in | Wt 133.0 lb

## 2023-02-13 DIAGNOSIS — Z7189 Other specified counseling: Secondary | ICD-10-CM

## 2023-02-13 DIAGNOSIS — C7800 Secondary malignant neoplasm of unspecified lung: Secondary | ICD-10-CM

## 2023-02-13 DIAGNOSIS — C50919 Malignant neoplasm of unspecified site of unspecified female breast: Secondary | ICD-10-CM

## 2023-02-13 DIAGNOSIS — Z7982 Long term (current) use of aspirin: Secondary | ICD-10-CM | POA: Diagnosis not present

## 2023-02-13 DIAGNOSIS — Z87891 Personal history of nicotine dependence: Secondary | ICD-10-CM | POA: Diagnosis not present

## 2023-02-13 DIAGNOSIS — Z170 Estrogen receptor positive status [ER+]: Secondary | ICD-10-CM | POA: Diagnosis not present

## 2023-02-13 DIAGNOSIS — Z9012 Acquired absence of left breast and nipple: Secondary | ICD-10-CM | POA: Diagnosis not present

## 2023-02-13 DIAGNOSIS — C50412 Malignant neoplasm of upper-outer quadrant of left female breast: Secondary | ICD-10-CM | POA: Diagnosis not present

## 2023-02-13 DIAGNOSIS — Z79811 Long term (current) use of aromatase inhibitors: Secondary | ICD-10-CM | POA: Diagnosis not present

## 2023-02-13 DIAGNOSIS — Z79899 Other long term (current) drug therapy: Secondary | ICD-10-CM | POA: Diagnosis not present

## 2023-02-13 MED ORDER — PALBOCICLIB 75 MG PO TABS
75.0000 mg | ORAL_TABLET | Freq: Every day | ORAL | 1 refills | Status: DC
  Filled 2023-02-13: qty 21, 21d supply, fill #0
  Filled 2023-02-17: qty 21, 28d supply, fill #0
  Filled 2023-02-17: qty 21, 21d supply, fill #0

## 2023-02-13 NOTE — Progress Notes (Signed)
No concerns. 

## 2023-02-13 NOTE — Telephone Encounter (Signed)
Oral Oncology Patient Advocate Encounter  Prior Authorization for [facility] has been approved.    PA# [id number]  Effective dates: 02/13/23 through 12/19/23  Patients co-pay is $3,309.79.    Berdine Addison, [facility] Patient [place]  [phone] (phone) [phone] (fax) 02/13/2023 12:08 PM

## 2023-02-13 NOTE — Telephone Encounter (Signed)
Oral Oncology Patient Advocate Encounter  New authorization   Received notification that prior authorization for [facility] is required.   PA submitted on 02/13/23  Key [id number]  Status is pending     Berdine Addison, [place] Patient [place]  [phone] (phone) [phone] (fax) 02/13/2023 12:02 PM

## 2023-02-13 NOTE — Progress Notes (Signed)
Spoke with patient regarding the Monique [name], she will be in on 03/02/2023 to sign and bring in proof of income.

## 2023-02-13 NOTE — Telephone Encounter (Signed)
Oral Oncology Pharmacist Encounter  Received new prescription for Ibrance (palbociclib) for the treatment of newly diagnosed metastatic ER/PR positive, HER2 negative in conjunction with letrozole, planned duration until disease progression or unacceptable drug toxicity.  CMP from 01/27/23 assessed, no relevant lab abnormalities. Prescription dose and frequency assessed. Patient starting in a reduced dose due to concerns about patient intolerance.   Current medication list in Epic reviewed, no DDIs with palbociclib identified.  Evaluated chart and no patient barriers to medication adherence identified.   Prescription has been e-scribed to the [facility] for benefits analysis and approval.  Oral Oncology Clinic will continue to follow for insurance authorization, copayment issues, initial counseling and start date.  Patient agreed to treatment on 02/13/23 per MD documentation.  Darl Pikes, PharmD, BCPS, BCOP, CPP Hematology/Oncology Clinical Pharmacist Practitioner [place]/DB/AP [facility] [phone]  02/13/2023 10:08 AM

## 2023-02-13 NOTE — Progress Notes (Signed)
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  This was ER 100% positive, PR 95% positive HER2 negative Ki-67 15%.  She underwent left mastectomy and did not require any adjuvant radiation therapy.  Oncotype score came back low at 5 and therefore did not require any adjuvant chemotherapy.  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  Patient was initially started on Arimidex which she could not tolerate due to dizziness and was switched to tamoxifen which she could also not tolerate and subsequently stopped taking endocrine therapy shortly.  She was seen by both Dr. Lindi Adie and Dr. Burr Medico at [facility] back in 2018 and was subsequently lost to follow-up   Patient has been seeing Dr. Valeta Harms this for evaluation of bilateral lung nodules and abnormal lung cancer screening CT noted in March 2023.  At that time she had a hypermetabolism with an SUV of 6 in the right hilum.  She had a bronchoscopy done and biopsies of the left bronchoscopy were negative as well as that of the hilum was negative.  There was appearance of a right lower lobe lung nodule which  was being followed given that it was spiculated.  There was a plan for repeat bronchoscopy in November 2023 but Dr. Valeta Harms was sick at that time and patient did not wish to get bronchoscopy by any other pulmonary.  Bronchoscopy was therefore delayed and patient had PET CT scan in January 2024.  PET scan showed intense hypermetabolic activity again in the right hilum with no clear lesion.  Findings concerning for neoplasm obstructing the bronchus with the right middle lobe atelectasis.  Low level of metabolic activity in the right lower lobe.  No other evidence of distant metastatic disease.  Prior to that in October 2023 patient was noted to have bilateral lung nodules varying from 4 to 8 mm in size which could be potentially metastatic.   Had repeat bronchoscopy on 01/17/2023.  Right upper lobe endobronchial lesion brushing was positive for metastatic breast carcinoma.  Tumor cells positive for GATA3 and ER.  Negative for CK5/6, p40, TTF-1, chromogranin, Napsin synaptophysin and CD56.  Patient is consistent with metastatic carcinoma of breast origin. Tumor was 90% ER positive, 85% PR positive and HER2 negative. Patient started on letrozole in February 2024  Interval history-patient is tolerating letrozole well for the most part and has not missed any doses.  She does report some fatigue but denies any significant arthralgias.  ECOG PS- 1 Pain scale- 0   Review of systems- Review of Systems  Constitutional:  Positive for malaise/fatigue. Negative for chills, fever and weight loss.  HENT:  Negative for congestion, ear discharge and nosebleeds.   Eyes:  Negative for blurred vision.  Respiratory:  Negative for cough, hemoptysis, sputum production,  shortness of breath and wheezing.   Cardiovascular:  Negative for chest pain, palpitations, orthopnea and claudication.  Gastrointestinal:  Negative for abdominal pain, blood in stool, constipation, diarrhea, heartburn, melena, nausea and vomiting.   Genitourinary:  Negative for dysuria, flank pain, frequency, hematuria and urgency.  Musculoskeletal:  Negative for back pain, joint pain and myalgias.  Skin:  Negative for rash.  Neurological:  Negative for dizziness, tingling, focal weakness, seizures, weakness and headaches.  Endo/Heme/Allergies:  Does not bruise/bleed easily.  Psychiatric/Behavioral:  Negative for depression and suicidal ideas. The patient does not have insomnia.       Allergies  Allergen Reactions   Chantix [Varenicline]     Hallucination   Codeine Nausea And Vomiting   Wellbutrin [Bupropion] Other (See Comments)    Hallucinations       Past Medical History:  Diagnosis Date   Abnormal EKG    Anxiety    Phreesia 11/29/2020   Arthritis    Phreesia 11/29/2020   Barrett's esophagus    Breast cancer ([place]) 2018   Left breast   Cancer ([place])    Phreesia [id number]   Complication of anesthesia    Family history of breast cancer    Family history of prostate cancer    Family history of thyroid cancer    Fatigue    Generalized headaches    headaches are less since using a mouth piece to prevent grinding of teeth   Hepatitis    as a teenager   IBS (irritable bowel syndrome)    Meniere disease    Osteopenia    Pneumonia    PONV (postoperative nausea and vomiting)    Psoriasis    Tinnitus    left ear     Past Surgical History:  Procedure Laterality Date   BREAST SURGERY N/A    Phreesia 11/29/2020   BRONCHIAL BIOPSY  01/17/2023   Procedure: BRONCHIAL BIOPSIES;  Surgeon: Garner Nash, DO;  Location: [facility];  Service: Pulmonary;;   BRONCHIAL BRUSHINGS  01/17/2023   Procedure: BRONCHIAL BRUSHINGS;  Surgeon: Garner Nash, DO;  Location: [facility];  Service: Pulmonary;;   DILATION AND CURETTAGE OF UTERUS     FINE NEEDLE ASPIRATION  05/10/2022   Procedure: FINE NEEDLE ASPIRATION (FNA) LINEAR;  Surgeon: Garner Nash, DO;  Location: [facility];  Service: Pulmonary;;   MASTECTOMY Left  2018   MASTECTOMY W/ SENTINEL NODE BIOPSY Left 08/03/2017   Procedure: LEFT MASTECTOMY WITH LEFT AXILLARY SENTINEL LYMPH NODE BIOPSY;  Surgeon: Alphonsa Overall, MD;  Location: [facility];  Service: General;  Laterality: Left;   skin cancer removal     TONSILLECTOMY     VIDEO BRONCHOSCOPY WITH ENDOBRONCHIAL ULTRASOUND N/A 05/10/2022   Procedure: VIDEO BRONCHOSCOPY WITH ENDOBRONCHIAL ULTRASOUND;  Surgeon: Garner Nash, DO;  Location: [facility];  Service: Pulmonary;  Laterality: N/A;   VIDEO BRONCHOSCOPY WITH ENDOBRONCHIAL ULTRASOUND Right 01/17/2023   Procedure: VIDEO BRONCHOSCOPY WITH ENDOBRONCHIAL ULTRASOUND;  Surgeon: Garner Nash, DO;  Location: [facility];  Service: Pulmonary;  Laterality: Right;    Social History   Socioeconomic History   Marital status: Married    Spouse name: Not on file   Number of children: Not on file   Years of education: Not on file   Highest education level: Not on file  Occupational History   Not on file  Tobacco Use   Smoking status: Former    Packs/day: 0.50    Types: Cigarettes    Quit date:  2018    Years since quitting: 6.1    Passive exposure: Past   Smokeless tobacco: Never  Vaping Use   Vaping Use: Never used  Substance and Sexual Activity   Alcohol use: Yes    Comment: occas   Drug use: Never   Sexual activity: Not Currently    Birth control/protection: Post-menopausal  Other Topics Concern   Not on file  Social History Narrative   Not on file   Social Determinants of Health   Financial Resource Strain: Low Risk  (01/11/2022)   Overall Financial Resource Strain (CARDIA)    Difficulty of Paying Living Expenses: Not very hard  Food Insecurity: No Food Insecurity (01/27/2023)   Hunger Vital Sign    Worried About Running Out of Food in the Last Year: Never true    Ran Out of Food in the Last Year: Never true  Transportation Needs: No Transportation Needs (01/11/2022)   PRAPARE - Radiographer, therapeutic (Medical): No    Lack of Transportation (Non-Medical): No  Physical Activity: Sufficiently Active (01/11/2022)   Exercise Vital Sign    Days of Exercise per Week: 5 days    Minutes of Exercise per Session: 30 min  Stress: Stress Concern Present (01/11/2022)   [place]    Feeling of Stress : Rather much  Social Connections: Moderately Isolated (01/11/2022)   Social Connection and Isolation Panel [NHANES]    Frequency of Communication with Friends and Family: More than three times a week    Frequency of Social Gatherings with Friends and Family: Once a week    Attends Religious Services: Never    Marine scientist or Organizations: No    Attends Archivist Meetings: Never    Marital Status: Married  Human resources officer Violence: Not At Risk (02/09/2023)   Humiliation, Afraid, Rape, and Kick questionnaire    Fear of Current or Ex-Partner: No    Emotionally Abused: No    Physically Abused: No    Sexually Abused: No    Family History  Problem Relation Age of Onset   Hypertension Mother    Heart attack Father    Hypertension Father    Healthy Maternal Aunt    Healthy Maternal Uncle    Breast cancer Paternal Aunt 69       bilateral   Healthy Paternal Aunt    Prostate cancer Paternal Uncle    Thyroid cancer Paternal Uncle    Healthy Paternal Uncle    Breast cancer Cousin 31       paternal first cousin   Breast cancer Cousin        paternal first cousin   Colon cancer Neg Hx    Esophageal cancer Neg Hx    Rectal cancer Neg Hx    Stomach cancer Neg Hx      Current Outpatient Medications:    APPLE CIDER VINEGAR PO, Take 1 capsule by mouth daily., Disp: , Rfl:    CINNAMON PO, Take 1 capsule by mouth daily., Disp: , Rfl:    Coenzyme Q10 (COQ10 PO), Take 400 mg by mouth daily., Disp: , Rfl:    diazepam (VALIUM) 10 MG tablet, TAKE ONE TABLET BY MOUTH EVERY 12 HOURS AS NEEDED FOR ANXIETY  (Patient taking differently: Take 5 mg by mouth at bedtime.), Disp: 30 tablet, Rfl: 1   FIBER ADULT GUMMIES PO, Take 2 tablets by mouth daily. Vitafusion Fiber Well Gummies, Disp: ,  Rfl:    letrozole (FEMARA) 2.5 MG tablet, Take 1 tablet (2.5 mg total) by mouth daily., Disp: 30 tablet, Rfl: 3   Multiple Vitamins-Minerals (AIRBORNE PO), Take 1 tablet by mouth daily as needed (immune support)., Disp: , Rfl:    TURMERIC CURCUMIN PO, Take 1 capsule by mouth daily., Disp: , Rfl:    aspirin EC 81 MG tablet, Take 1 tablet (81 mg total) by mouth daily. Swallow whole. (Patient not taking: Reported on 01/27/2023), Disp: 90 tablet, Rfl: 3   atorvastatin (LIPITOR) 40 MG tablet, Take 1 tablet (40 mg total) by mouth daily. (Patient not taking: Reported on 01/27/2023), Disp: 90 tablet, Rfl: 3   palbociclib (IBRANCE) 75 MG tablet, Take 1 tablet (75 mg total) by mouth daily. Take for 21 days on, 7 days off, repeat every 28 days., Disp: 21 tablet, Rfl: 1  Current Facility-Administered Medications:    0.9 %  sodium chloride infusion, 500 mL, Intravenous, Once, Daryel November, MD  Physical exam:  Vitals:   02/13/23 0928 02/13/23 0933  BP: (!) 144/65 123/67  Pulse: 63   Resp: 18   Temp: 98.2 F (36.8 C)   TempSrc: Tympanic   SpO2: 100%   Weight: 133 lb (60.3 kg)   Height: '5\' 2"'$  (1.575 m)    Physical Exam Cardiovascular:     Rate and Rhythm: Normal rate and regular rhythm.     Heart sounds: Normal heart sounds.  Pulmonary:     Effort: Pulmonary effort is normal.     Breath sounds: Normal breath sounds.  Skin:    General: Skin is warm and dry.  Neurological:     Mental Status: She is alert and oriented to person, place, and time.         Latest Ref Rng & Units 01/27/2023    3:30 PM  CMP  Glucose 70 - 99 mg/dL 84   BUN 8 - 23 mg/dL 11   Creatinine 0.44 - 1.00 mg/dL 0.69   Sodium 135 - 145 mmol/L 134   Potassium 3.5 - 5.1 mmol/L 3.7   Chloride 98 - 111 mmol/L 97   CO2 22 - 32 mmol/L 27    Calcium 8.9 - 10.3 mg/dL 9.1   Total Protein 6.5 - 8.1 g/dL 8.0   Total Bilirubin 0.3 - 1.2 mg/dL 0.6   Alkaline Phos 38 - 126 U/L 58   AST 15 - 41 U/L 21   ALT 0 - 44 U/L 14       Latest Ref Rng & Units 01/27/2023    3:30 PM  CBC  WBC 4.0 - 10.5 K/uL 8.1   Hemoglobin 12.0 - 15.0 g/dL 12.7   Hematocrit 36.0 - 46.0 % 39.3   Platelets 150 - 400 K/uL 316      Assessment and plan- Patient is a 67 y.o. female with metastatic breast cancer ER/PR positive HER2 negative to the right hilum and right lower lobe lung nodule here for further follow-up  Patient has low-volume metastatic disease inAs noted on the PET scan when the primary area of metastatic breast cancer was intrabronchial mass noted in the right hilum.  She also has a 9 mm right lower lobe lung nodule with an SUV of 1.4 which may be possibly metastatic.  Patient was started on letrozole 2 weeks ago which she is tolerating relatively well without any significant side effects.  I do recommend adding CDK inhibitors at this time as well.  We have an option between palbociclib, abemaciclib  and Ribociclib.  Given that patient has been intolerant to multiple medications in the past I will start with the lowest dose of palbociclib/Ibrance 75 mg 3 weeks on and 1 week off.  Discussed risks and benefits of the medication including all but not limited to nausea vomiting diarrhea low blood counts, risk of infections and hospitalizations.  Treatment will be given with a palliative intent.  Patient understands and agrees to proceed as planned.  Athough palbociclib did not demonstrate OS benefit in the PALOMA-2 trial (54 months versus 51 months, HR 0.96, 95% CI 0.78-1.2) [15], there were significant missing follow-up data that were imbalanced between the arms. Combined analysis of PALOMA-1 and PALOMA-2 did show OS benefit in the group that had disease-free interval over 12 months.   We will start working on Biochemist, clinical for [employer].  I will  tentatively see her back in 1 month with labs.  She will need interim labs at 2 weeks after starting Ibrance as well which she prefers to get with her primary care doctor's office we will reach out to him.   Visit Diagnosis 1. Goals of care, counseling/discussion   2. Malignant neoplasm of breast metastatic to lung [facility])      Dr. Randa Evens, MD, MPH [facility] [id number] 02/13/2023 10:38 AM

## 2023-02-14 ENCOUNTER — Other Ambulatory Visit (HOSPITAL_COMMUNITY): Payer: Self-pay

## 2023-02-14 ENCOUNTER — Other Ambulatory Visit: Payer: Self-pay | Admitting: Oncology

## 2023-02-14 ENCOUNTER — Telehealth: Payer: Self-pay

## 2023-02-14 DIAGNOSIS — Z1231 Encounter for screening mammogram for malignant neoplasm of breast: Secondary | ICD-10-CM

## 2023-02-14 MED ORDER — ONDANSETRON HCL 8 MG PO TABS: 8.0000 mg | ORAL_TABLET | Freq: Three times a day (TID) | ORAL | 1 refills | Status: DC | PRN

## 2023-02-14 NOTE — Telephone Encounter (Signed)
Oral Oncology Patient Advocate Encounter   Began application for assistance for Ibrance through [employer] Patient Assistance Program.   Application will be submitted upon completion of necessary supporting documentation.   [employer]'s phone number [phone].   I will continue to check the status until final determination.    Berdine Addison, [facility] Patient [place]  [phone] (phone) [phone] (fax) 02/14/2023 11:34 AM

## 2023-02-14 NOTE — Telephone Encounter (Signed)
Oral Oncology Patient Advocate Encounter  Reached out and spoke with patient regarding PAP paperwork, explained that I would send it to their preferred email via DocuSign.   Confirmed email address: [email or web address].    Patient expressed understanding and consent.  Will follow up once paperwork has been signed and returned.   Berdine Addison, [facility] Patient [place]  [phone] (phone) [phone] (fax) 02/14/2023 12:32 PM

## 2023-02-14 NOTE — Telephone Encounter (Signed)
Oral Chemotherapy Pharmacist Encounter  Patient will start her Ibrance on 02/16/23.  Patient Education I spoke with patient for overview of new oral chemotherapy medication: Ibrance (palbociclib) for the treatment of newly diagnosed metastatic ER/PR positive, HER2 negative in conjunction with letrozole, planned duration until disease progression or unacceptable drug toxicity.   Counseled patient on administration, dosing, side effects, monitoring, drug-food interactions, safe handling, storage, and disposal. Patient will take 1 tablet (75 mg total) by mouth daily. Take for 21 days on, 7 days off, repeat every 28 days.  Side effects include but not limited to: decreased wbc/hgb/plt, fatigue, nausea, mouth sores.    Reviewed with patient importance of keeping a medication schedule and plan for any missed doses.  After discussion with patient no patient barriers to medication adherence identified.   Monique [name] voiced understanding and appreciation. All questions answered. Medication handout provided.  Provided patient with [facility] phone number. Patient knows to call the office with questions or concerns. Oral Chemotherapy [facility] will continue to follow.  Of note: Patient is traveling to [place] for the month of April, Dr. Janese Banks aware Dental: she will have a cleaning on 3/12, a crown on 5/7, and extraction in June. Dr. Janese Banks notified of upcoming dental plans  Darl Pikes, PharmD, BCPS, BCOP, CPP Hematology/Oncology Clinical Pharmacist Practitioner [place]/[facility]/AP [facility] [phone]  02/14/2023 3:07 PM

## 2023-02-15 ENCOUNTER — Telehealth: Payer: Self-pay

## 2023-02-15 NOTE — Telephone Encounter (Signed)
Oral Oncology Patient Advocate Encounter   Was successful in obtaining a trial card for Ibrance.  This trial card will make the patients copay $0 for the first fill while we wait for Patient Assistance Application to process.  I have spoken with the patient.    The billing information is as follows and has been shared with [facility].   RxBin: [id number] PCN:  Member ID: [id number] Group ID: [id number]   Berdine Addison, [facility] Patient [place]  [phone] (phone) [phone] (fax) 02/15/2023 8:58 AM

## 2023-02-15 NOTE — Telephone Encounter (Deleted)
Oral Oncology Patient Advocate Encounter   Submitted application for assistance for [place] to [employer] Patient Assistance Program.   Application submitted via e-fax to [phone]   [employer]'s phone number [phone].   I will continue to check the status until final determination.    Berdine Addison, [facility] Patient [place]  [phone] (phone) [phone] (fax) 02/15/2023 9:08 AM

## 2023-02-15 NOTE — Telephone Encounter (Signed)
Oral Oncology Patient Advocate Encounter   Submitted application for assistance for [place] to [employer] Patient Assistance Program.   Application submitted via e-fax to [phone]   [employer]'s phone number [phone].   I will continue to check the status until final determination.    Berdine Addison, [facility] Patient [place]  [phone] (phone) [phone] (fax) 02/15/2023 9:09 AM

## 2023-02-16 ENCOUNTER — Other Ambulatory Visit (HOSPITAL_COMMUNITY): Payer: Self-pay

## 2023-02-17 ENCOUNTER — Other Ambulatory Visit (HOSPITAL_COMMUNITY): Payer: Self-pay

## 2023-02-17 ENCOUNTER — Other Ambulatory Visit: Payer: Self-pay

## 2023-02-17 NOTE — Telephone Encounter (Signed)
Update: Issue with patients medication shipment. Will be delivered today and patient will start today 02/17/23. Updated medication calendar will be mailed to patient

## 2023-02-21 NOTE — Telephone Encounter (Signed)
Called to check status of application. Informed by representative that application was in processing and to expect up to 72 hours for final decision. I will continue to follow and update until final determination.    Monique [name], [facility] Patient [place]  [phone] (phone) [phone] (fax) 02/21/2023 8:37 AM

## 2023-02-22 ENCOUNTER — Encounter: Payer: Self-pay | Admitting: Oncology

## 2023-02-22 NOTE — Telephone Encounter (Signed)
Oral Oncology Patient Advocate Encounter   Received notification that the application for assistance for Ibrance through [place] has been approved.   [employer]'s phone number [phone].   Effective dates: 02/22/23 through 12/19/23  I have spoken to the patient and the patient has already called and scheduled their first fill. Patient knows to call me at [phone] with any questions or concerns.     Berdine Addison, [facility] Patient [place]  [phone] (phone) [phone] (fax) 02/22/2023 4:11 PM

## 2023-02-23 ENCOUNTER — Ambulatory Visit
Admission: RE | Admit: 2023-02-23 | Discharge: 2023-02-23 | Disposition: A | Discharge status: Home / Self Care | Payer: Medicare Other | Source: Ambulatory Visit | Attending: Oncology | Admitting: Oncology

## 2023-02-23 DIAGNOSIS — Z780 Asymptomatic menopausal state: Secondary | ICD-10-CM | POA: Diagnosis not present

## 2023-02-23 DIAGNOSIS — C7800 Secondary malignant neoplasm of unspecified lung: Secondary | ICD-10-CM | POA: Insufficient documentation

## 2023-02-23 DIAGNOSIS — Z1231 Encounter for screening mammogram for malignant neoplasm of breast: Secondary | ICD-10-CM | POA: Diagnosis present

## 2023-02-23 DIAGNOSIS — C50919 Malignant neoplasm of unspecified site of unspecified female breast: Secondary | ICD-10-CM | POA: Diagnosis present

## 2023-02-23 DIAGNOSIS — M85859 Other specified disorders of bone density and structure, unspecified thigh: Secondary | ICD-10-CM | POA: Diagnosis not present

## 2023-03-02 ENCOUNTER — Telehealth: Payer: Self-pay | Admitting: Family Medicine

## 2023-03-02 ENCOUNTER — Inpatient Hospital Stay: Payer: Medicare Other

## 2023-03-02 ENCOUNTER — Other Ambulatory Visit: Payer: Self-pay

## 2023-03-02 ENCOUNTER — Inpatient Hospital Stay: Payer: Medicare Other | Attending: Oncology | Admitting: Licensed Clinical Social Worker

## 2023-03-02 DIAGNOSIS — Z79811 Long term (current) use of aromatase inhibitors: Secondary | ICD-10-CM | POA: Diagnosis not present

## 2023-03-02 DIAGNOSIS — C50412 Malignant neoplasm of upper-outer quadrant of left female breast: Secondary | ICD-10-CM | POA: Insufficient documentation

## 2023-03-02 DIAGNOSIS — Z7982 Long term (current) use of aspirin: Secondary | ICD-10-CM | POA: Insufficient documentation

## 2023-03-02 DIAGNOSIS — C7800 Secondary malignant neoplasm of unspecified lung: Secondary | ICD-10-CM | POA: Diagnosis present

## 2023-03-02 DIAGNOSIS — Z87891 Personal history of nicotine dependence: Secondary | ICD-10-CM | POA: Diagnosis not present

## 2023-03-02 DIAGNOSIS — M8580 Other specified disorders of bone density and structure, unspecified site: Secondary | ICD-10-CM | POA: Insufficient documentation

## 2023-03-02 DIAGNOSIS — Z170 Estrogen receptor positive status [ER+]: Secondary | ICD-10-CM | POA: Diagnosis not present

## 2023-03-02 DIAGNOSIS — C50919 Malignant neoplasm of unspecified site of unspecified female breast: Secondary | ICD-10-CM

## 2023-03-02 DIAGNOSIS — Z79899 Other long term (current) drug therapy: Secondary | ICD-10-CM | POA: Insufficient documentation

## 2023-03-02 LAB — CMP (CANCER CENTER ONLY)
ALT: 12 U/L (ref 0–44)
AST: 19 U/L (ref 15–41)
Albumin: 4.4 g/dL (ref 3.5–5.0)
Alkaline Phosphatase: 53 U/L (ref 38–126)
Anion gap: 7 (ref 5–15)
BUN: 13 mg/dL (ref 8–23)
CO2: 26 mmol/L (ref 22–32)
Calcium: 8.9 mg/dL (ref 8.9–10.3)
Chloride: 101 mmol/L (ref 98–111)
Creatinine: 1.01 mg/dL — ABNORMAL HIGH (ref 0.44–1.00)
GFR, Estimated: >60 mL/min (ref >60)
Glucose, Bld: 99 mg/dL (ref 70–99)
Potassium: 3.8 mmol/L (ref 3.5–5.1)
Sodium: 134 mmol/L — ABNORMAL LOW (ref 135–145)
Total Bilirubin: 0.6 mg/dL (ref 0.3–1.2)
Total Protein: 7.6 g/dL (ref 6.5–8.1)

## 2023-03-02 LAB — CBC WITH DIFFERENTIAL (CANCER CENTER ONLY)
Abs Immature Granulocytes: 0 10*3/uL (ref 0.00–0.07)
Basophils Absolute: 0 10*3/uL (ref 0.0–0.1)
Basophils Relative: 1 %
Eosinophils Absolute: 0.2 10*3/uL (ref 0.0–0.5)
Eosinophils Relative: 5 %
HCT: 36.7 % (ref 36.0–46.0)
Hemoglobin: 11.9 g/dL — ABNORMAL LOW (ref 12.0–15.0)
Immature Granulocytes: 0 %
Lymphocytes Relative: 53 %
Lymphs Abs: 1.7 10*3/uL (ref 0.7–4.0)
MCH: 28.1 pg (ref 26.0–34.0)
MCHC: 32.4 g/dL (ref 30.0–36.0)
MCV: 86.8 fL (ref 80.0–100.0)
Monocytes Absolute: 0.1 10*3/uL (ref 0.1–1.0)
Monocytes Relative: 4 %
Neutro Abs: 1.2 10*3/uL — ABNORMAL LOW (ref 1.7–7.7)
Neutrophils Relative %: 37 %
Platelet Count: 276 10*3/uL (ref 150–400)
RBC: 4.23 MIL/uL (ref 3.87–5.11)
RDW: 12.5 % (ref 11.5–15.5)
Smear Review: NORMAL
WBC Count: 3.2 10*3/uL — ABNORMAL LOW (ref 4.0–10.5)
nRBC: 0 % (ref 0.0–0.2)

## 2023-03-02 NOTE — Progress Notes (Signed)
[place] Social Work  Clinical Social Work was referred by medical provider  to review and complete healthcare advance directives.  Clinical Social Worker met with patient and N/A in [place] office.  The patient designated Monique [name] (spouse) as their primary healthcare agent and Ryna Cartagena (father) as their secondary agent.  Patient also completed healthcare living will.    Clinical Social Worker notarized documents and made copies for patient/family. Clinical Social Worker will send documents to medical records to be scanned into patient's chart. Clinical Social Worker encouraged patient/family to contact with any additional questions or concerns.  Adelene Amas MSW, LCSW Clinical Social Worker [facility] [phone]

## 2023-03-02 NOTE — Telephone Encounter (Signed)
Prescription Request  03/02/2023  LOV: 01/03/2023  What is the name of the medication or equipment?   amoxicillin (AMOXIL) 875 MG tablet [id number]  ENDED   Have you contacted your pharmacy to request a refill? Yes   Which pharmacy would you like this sent to?  [facility] [id number] [address], [place] [address] Phone: [phone] Fax: [phone]    Patient notified that their request is being sent to the clinical staff for review and that they should receive a response within 2 business days.   Please advise pharmacist.

## 2023-03-06 NOTE — Telephone Encounter (Signed)
LVM for re refills for Amoxicillin? Needs to why?

## 2023-03-07 ENCOUNTER — Ambulatory Visit (INDEPENDENT_AMBULATORY_CARE_PROVIDER_SITE_OTHER): Payer: Medicare Other | Admitting: Family Medicine

## 2023-03-07 ENCOUNTER — Encounter: Payer: Self-pay | Admitting: Family Medicine

## 2023-03-07 ENCOUNTER — Other Ambulatory Visit: Payer: Medicare Other

## 2023-03-07 VITALS — BP 120/62 | HR 62 | Temp 97.9°F | Ht 62.0 in | Wt 128.0 lb

## 2023-03-07 DIAGNOSIS — F439 Reaction to severe stress, unspecified: Secondary | ICD-10-CM

## 2023-03-07 DIAGNOSIS — F419 Anxiety disorder, unspecified: Secondary | ICD-10-CM

## 2023-03-07 MED ORDER — ESCITALOPRAM OXALATE 10 MG PO TABS: 10.0000 mg | ORAL_TABLET | Freq: Every day | ORAL | 3 refills | Status: DC

## 2023-03-07 NOTE — Progress Notes (Signed)
Subjective:    Patient ID: Monique [name], female    DOB: [birth year], 67 y.o.   MRN: [id number]  HPI Unfortunately, since I last saw the patient, the abnormal lesion in her lung has been diagnosed with metastatic breast cancer.  Currently on palliative letrozole and started Ibrance.  Obviously, the patient is dealing with a tremendous amount of stress.  She also has recently lost a family member and has another family member in the hospital.  Therefore she feels overwhelmed at times.  She is reporting trouble sleeping.  Since starting the treatment for breast cancer, she is developing diarrhea and bloating.  Yesterday she took a Valium and her physical symptoms improved and she felt better.  In the past, after her first divorce, she took Prozac for depression and this really helped with stress and anxiety.  She is interested in treatment for anxiety dealing with the situation as listed above. Past Medical History:  Diagnosis Date   Abnormal EKG    Anxiety    Phreesia 11/29/2020   Arthritis    Phreesia 11/29/2020   Barrett's esophagus    Breast cancer ([place]) 2018   Left breast   Cancer ([place])    Phreesia [id number]   Complication of anesthesia    Family history of breast cancer    Family history of prostate cancer    Family history of thyroid cancer    Fatigue    Generalized headaches    headaches are less since using a mouth piece to prevent grinding of teeth   Hepatitis    as a teenager   IBS (irritable bowel syndrome)    Meniere disease    Osteopenia    Pneumonia    PONV (postoperative nausea and vomiting)    Psoriasis    Tinnitus    left ear   Past Surgical History:  Procedure Laterality Date   BREAST SURGERY N/A    Phreesia 11/29/2020   BRONCHIAL BIOPSY  01/17/2023   Procedure: BRONCHIAL BIOPSIES;  Surgeon: Garner Nash, DO;  Location: [facility];  Service: Pulmonary;;   BRONCHIAL BRUSHINGS  01/17/2023   Procedure: BRONCHIAL BRUSHINGS;  Surgeon: Garner Nash,  DO;  Location: [facility];  Service: Pulmonary;;   DILATION AND CURETTAGE OF UTERUS     FINE NEEDLE ASPIRATION  05/10/2022   Procedure: FINE NEEDLE ASPIRATION (FNA) LINEAR;  Surgeon: Garner Nash, DO;  Location: [facility];  Service: Pulmonary;;   MASTECTOMY Left 2018   MASTECTOMY W/ SENTINEL NODE BIOPSY Left 08/03/2017   Procedure: LEFT MASTECTOMY WITH LEFT AXILLARY SENTINEL LYMPH NODE BIOPSY;  Surgeon: Alphonsa Overall, MD;  Location: [facility] [facility];  Service: General;  Laterality: Left;   skin cancer removal     TONSILLECTOMY     VIDEO BRONCHOSCOPY WITH ENDOBRONCHIAL ULTRASOUND N/A 05/10/2022   Procedure: VIDEO BRONCHOSCOPY WITH ENDOBRONCHIAL ULTRASOUND;  Surgeon: Garner Nash, DO;  Location: [facility];  Service: Pulmonary;  Laterality: N/A;   VIDEO BRONCHOSCOPY WITH ENDOBRONCHIAL ULTRASOUND Right 01/17/2023   Procedure: VIDEO BRONCHOSCOPY WITH ENDOBRONCHIAL ULTRASOUND;  Surgeon: Garner Nash, DO;  Location: [facility];  Service: Pulmonary;  Laterality: Right;   Current Outpatient Medications on File Prior to Visit  Medication Sig Dispense Refill   albuterol (VENTOLIN HFA) 108 (90 Base) MCG/ACT inhaler Inhale into the lungs.     APPLE CIDER VINEGAR PO Take 1 capsule by mouth daily.     aspirin EC 81 MG tablet Take 1 tablet (81 mg total) by mouth daily.  Swallow whole. 90 tablet 3   atorvastatin (LIPITOR) 40 MG tablet Take 1 tablet (40 mg total) by mouth daily. 90 tablet 3   CINNAMON PO Take 1 capsule by mouth daily.     Coenzyme Q10 (COQ10 PO) Take 400 mg by mouth daily.     diazepam (VALIUM) 10 MG tablet TAKE ONE TABLET BY MOUTH EVERY 12 HOURS AS NEEDED FOR ANXIETY (Patient taking differently: Take 5 mg by mouth at bedtime.) 30 tablet 1   FIBER ADULT GUMMIES PO Take 2 tablets by mouth daily. Vitafusion Fiber Well Gummies     letrozole (FEMARA) 2.5 MG tablet Take 1 tablet (2.5 mg total) by mouth daily. 30 tablet 3   Multiple Vitamins-Minerals (AIRBORNE PO) Take 1  tablet by mouth daily as needed (immune support).     ondansetron (ZOFRAN) 8 MG tablet Take 1 tablet (8 mg total) by mouth every 8 (eight) hours as needed for nausea or vomiting. 20 tablet 1   palbociclib (IBRANCE) 75 MG tablet Take 1 tablet (75 mg total) by mouth daily. Take for 21 days on, 7 days off, repeat every 28 days. 21 tablet 1   TURMERIC CURCUMIN PO Take 1 capsule by mouth daily.     Current Facility-Administered Medications on File Prior to Visit  Medication Dose Route Frequency Provider Last Rate Last Admin   0.9 %  sodium chloride infusion  500 mL Intravenous Once Daryel November, MD       Allergies  Allergen Reactions   Chantix [Varenicline]     Hallucination   Codeine Nausea And Vomiting   Wellbutrin [Bupropion] Other (See Comments)    Hallucinations     Social History   Socioeconomic History   Marital status: Married    Spouse name: Not on file   Number of children: Not on file   Years of education: Not on file   Highest education level: Not on file  Occupational History   Not on file  Tobacco Use   Smoking status: Former    Packs/day: .5    Types: Cigarettes    Quit date: 2018    Years since quitting: 6.2    Passive exposure: Past   Smokeless tobacco: Never  Vaping Use   Vaping Use: Never used  Substance and Sexual Activity   Alcohol use: Yes    Comment: occas   Drug use: Never   Sexual activity: Not Currently    Birth control/protection: Post-menopausal  Other Topics Concern   Not on file  Social History Narrative   Not on file   Social Determinants of Health   Financial Resource Strain: Low Risk  (01/11/2022)   Overall Financial Resource Strain (CARDIA)    Difficulty of Paying Living Expenses: Not very hard  Food Insecurity: No Food Insecurity (01/27/2023)   Hunger Vital Sign    Worried About Running Out of Food in the Last Year: Never true    Ran Out of Food in the Last Year: Never true  Transportation Needs: No Transportation Needs  (01/11/2022)   PRAPARE - Hydrologist (Medical): No    Lack of Transportation (Non-Medical): No  Physical Activity: Sufficiently Active (01/11/2022)   Exercise Vital Sign    Days of Exercise per Week: 5 days    Minutes of Exercise per Session: 30 min  Stress: Stress Concern Present (01/11/2022)   [place]    Feeling of Stress : Rather much  Social  Connections: Moderately Isolated (01/11/2022)   Social Connection and Isolation Panel [NHANES]    Frequency of Communication with Friends and Family: More than three times a week    Frequency of Social Gatherings with Friends and Family: Once a week    Attends Religious Services: Never    Marine scientist or Organizations: No    Attends Archivist Meetings: Never    Marital Status: Married  Human resources officer Violence: Not At Risk (02/09/2023)   Humiliation, Afraid, Rape, and Kick questionnaire    Fear of Current or Ex-Partner: No    Emotionally Abused: No    Physically Abused: No    Sexually Abused: No      Review of Systems  All other systems reviewed and are negative.      Objective:   Physical Exam Vitals reviewed.  Constitutional:      General: She is not in acute distress.    Appearance: She is well-developed. She is not diaphoretic.  HENT:     Head: Normocephalic and atraumatic.  Neck:     Thyroid: No thyromegaly.     Vascular: No JVD.     Trachea: No tracheal deviation.  Cardiovascular:     Rate and Rhythm: Regular rhythm.     Heart sounds: Normal heart sounds. No murmur heard.    No friction rub. No gallop.  Pulmonary:     Effort: Pulmonary effort is normal. No respiratory distress.     Breath sounds: Normal breath sounds. No stridor. No wheezing or rales.  Chest:     Chest wall: No tenderness.  Neurological:     Mental Status: She is alert.     Motor: No abnormal muscle tone.           Assessment  & Plan:  Situational stress  Anxiety Patient just got a refill of 30 Valium.  I will be glad to give her up to 60 a month.  Therefore she can take it twice a day if necessary however also recommended starting Lexapro 10 mg a day to try to help overall improve her mood and lower generalized anxiety level.  Also recommended starting a probiotic for the bloating and gas that she is experiencing.  If that does not work she can try Gas-X.  Reassess in 1 month or sooner if worsening

## 2023-03-13 NOTE — Progress Notes (Signed)
Called to speak with patient regarding the Gordy Levan, someone picked up but no one answered. Will try again at a later date.

## 2023-03-14 ENCOUNTER — Inpatient Hospital Stay (HOSPITAL_BASED_OUTPATIENT_CLINIC_OR_DEPARTMENT_OTHER): Payer: Medicare Other | Admitting: Oncology

## 2023-03-14 ENCOUNTER — Inpatient Hospital Stay: Payer: Medicare Other

## 2023-03-14 ENCOUNTER — Telehealth: Payer: Self-pay | Admitting: *Deleted

## 2023-03-14 VITALS — BP 134/71 | HR 59 | Temp 96.7°F | Resp 18 | Ht 61.0 in | Wt 130.1 lb

## 2023-03-14 DIAGNOSIS — C50919 Malignant neoplasm of unspecified site of unspecified female breast: Secondary | ICD-10-CM | POA: Diagnosis not present

## 2023-03-14 DIAGNOSIS — Z79899 Other long term (current) drug therapy: Secondary | ICD-10-CM | POA: Diagnosis not present

## 2023-03-14 DIAGNOSIS — C7800 Secondary malignant neoplasm of unspecified lung: Secondary | ICD-10-CM | POA: Diagnosis not present

## 2023-03-14 DIAGNOSIS — Z79811 Long term (current) use of aromatase inhibitors: Secondary | ICD-10-CM | POA: Diagnosis not present

## 2023-03-14 DIAGNOSIS — C50412 Malignant neoplasm of upper-outer quadrant of left female breast: Secondary | ICD-10-CM | POA: Diagnosis not present

## 2023-03-14 LAB — COMPREHENSIVE METABOLIC PANEL
ALT: 11 U/L (ref 0–44)
AST: 18 U/L (ref 15–41)
Albumin: 4.3 g/dL (ref 3.5–5.0)
Alkaline Phosphatase: 51 U/L (ref 38–126)
Anion gap: 8 (ref 5–15)
BUN: 9 mg/dL (ref 8–23)
CO2: 24 mmol/L (ref 22–32)
Calcium: 8.7 mg/dL — ABNORMAL LOW (ref 8.9–10.3)
Chloride: 102 mmol/L (ref 98–111)
Creatinine, Ser: 0.87 mg/dL (ref 0.44–1.00)
GFR, Estimated: >60 mL/min (ref >60)
Glucose, Bld: 92 mg/dL (ref 70–99)
Potassium: 4.1 mmol/L (ref 3.5–5.1)
Sodium: 134 mmol/L — ABNORMAL LOW (ref 135–145)
Total Bilirubin: 0.4 mg/dL (ref 0.3–1.2)
Total Protein: 7.5 g/dL (ref 6.5–8.1)

## 2023-03-14 LAB — CBC WITH DIFFERENTIAL/PLATELET
Abs Immature Granulocytes: 0.01 10*3/uL (ref 0.00–0.07)
Basophils Absolute: 0.1 10*3/uL (ref 0.0–0.1)
Basophils Relative: 2 %
Eosinophils Absolute: 0.1 10*3/uL (ref 0.0–0.5)
Eosinophils Relative: 2 %
HCT: 34.9 % — ABNORMAL LOW (ref 36.0–46.0)
Hemoglobin: 11.4 g/dL — ABNORMAL LOW (ref 12.0–15.0)
Immature Granulocytes: 0 %
Lymphocytes Relative: 60 %
Lymphs Abs: 1.8 10*3/uL (ref 0.7–4.0)
MCH: 28.8 pg (ref 26.0–34.0)
MCHC: 32.7 g/dL (ref 30.0–36.0)
MCV: 88.1 fL (ref 80.0–100.0)
Monocytes Absolute: 0.2 10*3/uL (ref 0.1–1.0)
Monocytes Relative: 5 %
Neutro Abs: 1 10*3/uL — ABNORMAL LOW (ref 1.7–7.7)
Neutrophils Relative %: 31 %
Platelets: 287 10*3/uL (ref 150–400)
RBC: 3.96 MIL/uL (ref 3.87–5.11)
RDW: 13.4 % (ref 11.5–15.5)
Smear Review: NORMAL
WBC: 3.1 10*3/uL — ABNORMAL LOW (ref 4.0–10.5)
nRBC: 0 % (ref 0.0–0.2)

## 2023-03-14 NOTE — Telephone Encounter (Signed)
The pt. Will be in [place] at 1 month and she was provided a [facility] sheet for the labs in 1 month. 2 months the pt wants to have it at her P[facility] which [facility] and gave [facility] order for [facility] to get the 2 month labs. 3rd month the pt will get labs her at 3 months.pt agreeable .

## 2023-03-14 NOTE — Progress Notes (Signed)
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  This was ER 100% positive, PR 95% positive HER2 negative Ki-67 15%.  She underwent left mastectomy and did not require any adjuvant radiation therapy.  Oncotype score came back low at 5 and therefore did not require any adjuvant chemotherapy.  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  Patient was initially started on Arimidex which she could not tolerate due to dizziness and was switched to tamoxifen which she could also not tolerate and subsequently stopped taking endocrine therapy shortly.  She was seen by both Dr. Lindi Adie and Dr. Burr Medico at [facility] back in 2018 and was subsequently lost to follow-up   Patient has been seeing Dr. Valeta Harms this for evaluation of bilateral lung nodules and abnormal lung cancer screening CT noted in March 2023.  At that time she had a hypermetabolism with an SUV of 6 in the right hilum.  She had a bronchoscopy done and biopsies of the left bronchoscopy were negative as well as that of the hilum was negative.  There was appearance of a right lower lobe  lung nodule which was being followed given that it was spiculated.  There was a plan for repeat bronchoscopy in November 2023 but Dr. Valeta Harms was sick at that time and patient did not wish to get bronchoscopy by any other pulmonary.  Bronchoscopy was therefore delayed and patient had PET CT scan in January 2024.  PET scan showed intense hypermetabolic activity again in the right hilum with no clear lesion.  Findings concerning for neoplasm obstructing the bronchus with the right middle lobe atelectasis.  Low level of metabolic activity in the right lower lobe.  No other evidence of distant metastatic disease.  Prior to that in October 2023 patient was noted to have bilateral lung nodules varying from 4 to 8 mm in size which could be potentially metastatic.   Had repeat bronchoscopy on 01/17/2023.  Right upper lobe endobronchial lesion brushing was positive for metastatic breast carcinoma.  Tumor cells positive for GATA3 and ER.  Negative for CK5/6, p40, TTF-1, chromogranin, Napsin synaptophysin and CD56.  Patient is consistent with metastatic carcinoma of breast origin. Tumor was 90% ER positive, 85% PR positive and HER2 negative. Patient started on letrozole in February 2024.  Ibrance started in March 2024.  Interval history-patient is tolerating 75 mg of Ibrance well without significant side effects.  She had 1 episode of self-limited diarrhea.  Energy levels have remained stable.  She is also on letrozole along with calcium and vitamin D.  ECOG PS- 1 Pain scale- 1   Review of systems- Review of Systems  Constitutional:  Positive for malaise/fatigue. Negative for chills, fever and weight loss.  HENT:  Negative  for congestion, ear discharge and nosebleeds.   Eyes:  Negative for blurred vision.  Respiratory:  Negative for cough, hemoptysis, sputum production, shortness of breath and wheezing.   Cardiovascular:  Negative for chest pain, palpitations, orthopnea and claudication.  Gastrointestinal:   Negative for abdominal pain, blood in stool, constipation, diarrhea, heartburn, melena, nausea and vomiting.  Genitourinary:  Negative for dysuria, flank pain, frequency, hematuria and urgency.  Musculoskeletal:  Negative for back pain, joint pain and myalgias.  Skin:  Negative for rash.  Neurological:  Negative for dizziness, tingling, focal weakness, seizures, weakness and headaches.  Endo/Heme/Allergies:  Does not bruise/bleed easily.  Psychiatric/Behavioral:  Negative for depression and suicidal ideas. The patient does not have insomnia.       Allergies  Allergen Reactions   Chantix [Varenicline]     Hallucination   Codeine Nausea And Vomiting   Wellbutrin [Bupropion] Other (See Comments)    Hallucinations       Past Medical History:  Diagnosis Date   Abnormal EKG    Anxiety    Phreesia 11/29/2020   Arthritis    Phreesia 11/29/2020   Barrett's esophagus    Breast cancer ([place]) 2018   Left breast   Cancer ([place])    Phreesia [id number]   Complication of anesthesia    Family history of breast cancer    Family history of prostate cancer    Family history of thyroid cancer    Fatigue    Generalized headaches    headaches are less since using a mouth piece to prevent grinding of teeth   Hepatitis    as a teenager   IBS (irritable bowel syndrome)    Meniere disease    Osteopenia    Pneumonia    PONV (postoperative nausea and vomiting)    Psoriasis    Tinnitus    left ear     Past Surgical History:  Procedure Laterality Date   BREAST SURGERY N/A    Phreesia 11/29/2020   BRONCHIAL BIOPSY  01/17/2023   Procedure: BRONCHIAL BIOPSIES;  Surgeon: Garner Nash, DO;  Location: [place] [facility];  Service: Pulmonary;;   BRONCHIAL BRUSHINGS  01/17/2023   Procedure: BRONCHIAL BRUSHINGS;  Surgeon: Garner Nash, DO;  Location: [place] [facility];  Service: Pulmonary;;   DILATION AND CURETTAGE OF UTERUS     FINE NEEDLE ASPIRATION  05/10/2022   Procedure: FINE NEEDLE ASPIRATION  (FNA) LINEAR;  Surgeon: Garner Nash, DO;  Location: [place] [facility];  Service: Pulmonary;;   MASTECTOMY Left 2018   MASTECTOMY W/ SENTINEL NODE BIOPSY Left 08/03/2017   Procedure: LEFT MASTECTOMY WITH LEFT AXILLARY SENTINEL LYMPH NODE BIOPSY;  Surgeon: Alphonsa Overall, MD;  Location: [place];  Service: General;  Laterality: Left;   skin cancer removal     TONSILLECTOMY     VIDEO BRONCHOSCOPY WITH ENDOBRONCHIAL ULTRASOUND N/A 05/10/2022   Procedure: VIDEO BRONCHOSCOPY WITH ENDOBRONCHIAL ULTRASOUND;  Surgeon: Garner Nash, DO;  Location: [place] [facility];  Service: Pulmonary;  Laterality: N/A;   VIDEO BRONCHOSCOPY WITH ENDOBRONCHIAL ULTRASOUND Right 01/17/2023   Procedure: VIDEO BRONCHOSCOPY WITH ENDOBRONCHIAL ULTRASOUND;  Surgeon: Garner Nash, DO;  Location: [place];  Service: Pulmonary;  Laterality: Right;    Social History   Socioeconomic History   Marital status: Married    Spouse name: Not on file   Number of children: Not on file   Years of education: Not on file   Highest education level: Not on file  Occupational History   Not on file  Tobacco Use   Smoking status: Former    Packs/day: .5    Types: Cigarettes    Quit date: 2018    Years since quitting: 6.2    Passive exposure: Past   Smokeless tobacco: Never  Vaping Use   Vaping Use: Never used  Substance and Sexual Activity   Alcohol use: Yes    Comment: occas   Drug use: Never   Sexual activity: Not Currently    Birth control/protection: Post-menopausal  Other Topics Concern   Not on file  Social History Narrative   Not on file   Social Determinants of Health   Financial Resource Strain: Low Risk  (01/11/2022)   Overall Financial Resource Strain (CARDIA)    Difficulty of Paying Living Expenses: Not very hard  Food Insecurity: No Food Insecurity (01/27/2023)   Hunger Vital Sign    Worried About Running Out of Food in the Last Year: Never true    Ran Out of Food in the Last Year: Never  true  Transportation Needs: No Transportation Needs (01/11/2022)   PRAPARE - Hydrologist (Medical): No    Lack of Transportation (Non-Medical): No  Physical Activity: Sufficiently Active (01/11/2022)   Exercise Vital Sign    Days of Exercise per Week: 5 days    Minutes of Exercise per Session: 30 min  Stress: Stress Concern Present (01/11/2022)   [place]    Feeling of Stress : Rather much  Social Connections: Moderately Isolated (01/11/2022)   Social Connection and Isolation Panel [NHANES]    Frequency of Communication with Friends and Family: More than three times a week    Frequency of Social Gatherings with Friends and Family: Once a week    Attends Religious Services: Never    Marine scientist or Organizations: No    Attends Archivist Meetings: Never    Marital Status: Married  Human resources officer Violence: Not At Risk (02/09/2023)   Humiliation, Afraid, Rape, and Kick questionnaire    Fear of Current or Ex-Partner: No    Emotionally Abused: No    Physically Abused: No    Sexually Abused: No    Family History  Problem Relation Age of Onset   Hypertension Mother    Heart attack Father    Hypertension Father    Healthy Maternal Aunt    Healthy Maternal Uncle    Breast cancer Paternal Aunt 69       bilateral   Healthy Paternal Aunt    Prostate cancer Paternal Uncle    Thyroid cancer Paternal Uncle    Healthy Paternal Uncle    Breast cancer Cousin 64       paternal first cousin   Breast cancer Cousin        paternal first cousin   Colon cancer Neg Hx    Esophageal cancer Neg Hx    Rectal cancer Neg Hx    Stomach cancer Neg Hx      Current Outpatient Medications:    albuterol (VENTOLIN HFA) 108 (90 Base) MCG/ACT inhaler, Inhale into the lungs., Disp: , Rfl:    APPLE CIDER VINEGAR PO, Take 1 capsule by mouth daily., Disp: , Rfl:    aspirin EC 81 MG tablet,  Take 1 tablet (81 mg total) by mouth daily. Swallow whole., Disp: 90 tablet, Rfl: 3   atorvastatin (LIPITOR) 40 MG tablet, Take 1 tablet (40 mg total) by mouth daily., Disp: 90 tablet,  Rfl: 3   CINNAMON PO, Take 1 capsule by mouth daily., Disp: , Rfl:    diazepam (VALIUM) 10 MG tablet, TAKE ONE TABLET BY MOUTH EVERY 12 HOURS AS NEEDED FOR ANXIETY, Disp: 30 tablet, Rfl: 1   escitalopram (LEXAPRO) 10 MG tablet, Take 1 tablet (10 mg total) by mouth daily., Disp: 30 tablet, Rfl: 3   FIBER ADULT GUMMIES PO, Take 2 tablets by mouth daily. Vitafusion Fiber Well Gummies, Disp: , Rfl:    letrozole (FEMARA) 2.5 MG tablet, Take 1 tablet (2.5 mg total) by mouth daily., Disp: 30 tablet, Rfl: 3   Multiple Vitamins-Minerals (AIRBORNE PO), Take 1 tablet by mouth daily as needed (immune support)., Disp: , Rfl:    ondansetron (ZOFRAN) 8 MG tablet, Take 1 tablet (8 mg total) by mouth every 8 (eight) hours as needed for nausea or vomiting., Disp: 20 tablet, Rfl: 1   palbociclib (IBRANCE) 75 MG tablet, Take 1 tablet (75 mg total) by mouth daily. Take for 21 days on, 7 days off, repeat every 28 days., Disp: 21 tablet, Rfl: 1   TURMERIC CURCUMIN PO, Take 1 capsule by mouth daily., Disp: , Rfl:    Coenzyme Q10 (COQ10 PO), Take 400 mg by mouth daily. (Patient not taking: Reported on 03/14/2023), Disp: , Rfl:   Current Facility-Administered Medications:    0.9 %  sodium chloride infusion, 500 mL, Intravenous, Once, Daryel November, MD  Physical exam:  Vitals:   03/14/23 1101  BP: 134/71  Pulse: (!) 59  Resp: 18  Temp: (!) 96.7 F (35.9 C)  TempSrc: Tympanic  SpO2: 100%  Weight: 130 lb 1.6 oz (59 kg)  Height: 5\' 1"  (1.549 m)   Physical Exam Cardiovascular:     Rate and Rhythm: Normal rate and regular rhythm.     Heart sounds: Normal heart sounds.  Pulmonary:     Effort: Pulmonary effort is normal.  Skin:    General: Skin is warm and dry.  Neurological:     Mental Status: She is alert and oriented to  person, place, and time.         Latest Ref Rng & Units 03/14/2023   10:49 AM  CMP  Glucose 70 - 99 mg/dL 92   BUN 8 - 23 mg/dL 9   Creatinine 0.44 - 1.00 mg/dL 0.87   Sodium 135 - 145 mmol/L 134   Potassium 3.5 - 5.1 mmol/L 4.1   Chloride 98 - 111 mmol/L 102   CO2 22 - 32 mmol/L 24   Calcium 8.9 - 10.3 mg/dL 8.7   Total Protein 6.5 - 8.1 g/dL 7.5   Total Bilirubin 0.3 - 1.2 mg/dL 0.4   Alkaline Phos 38 - 126 U/L 51   AST 15 - 41 U/L 18   ALT 0 - 44 U/L 11       Latest Ref Rng & Units 03/14/2023   10:49 AM  CBC  WBC 4.0 - 10.5 K/uL 3.1   Hemoglobin 12.0 - 15.0 g/dL 11.4   Hematocrit 36.0 - 46.0 % 34.9   Platelets 150 - 400 K/uL 287     No images are attached to the encounter.  MM 3D SCREEN BREAST UNI RIGHT  Result Date: 02/27/2023 CLINICAL DATA:  Screening. EXAM: DIGITAL SCREENING UNILATERAL RIGHT MAMMOGRAM WITH CAD AND TOMOSYNTHESIS TECHNIQUE: Right screening digital craniocaudal and mediolateral oblique mammograms were obtained. Right screening digital breast tomosynthesis was performed. The images were evaluated with computer-aided detection. COMPARISON:  Previous exam(s). ACR Breast Density Category  b: There are scattered areas of fibroglandular density. FINDINGS: The patient has had a left mastectomy. There are no findings suspicious for malignancy. IMPRESSION: No mammographic evidence of malignancy. A result letter of this screening mammogram will be mailed directly to the patient. RECOMMENDATION: Screening mammogram in one year.  (Code:[id number]-R-78M) BI-RADS CATEGORY  1: Negative. Electronically Signed   By: Ammie Ferrier M.D.   On: 02/27/2023 10:46   DG Bone Density  Result Date: 02/23/2023 EXAM: DUAL X-RAY ABSORPTIOMETRY (DXA) FOR BONE MINERAL DENSITY IMPRESSION: Your patient Lamees Lynam completed a BMD test on 02/23/2023 using the [place] (analysis version: 14.10) manufactured by [employer]. The following summarizes the results of our  evaluation. Technologist: PATIENT BIOGRAPHICAL: Name: Josslin, Dalke Patient ID: [id number] Birth Date: 1956-05-04 Height: 60.5 in. Gender: Female Exam Date: 02/23/2023 Weight: 128.8 lbs. Indications: Caucasian, History of Breast Cancer, POSTmenopausal Fractures: Treatments: Calcium, Fosamax, Ibrance, Vitamin D ASSESSMENT: The BMD measured at Femur Neck Left is 0.725 g/cm2 with a T-score of -2.3. This patient is considered osteopenic according to [facility]) criteria. L-4 was excluded due to degenerative changes. The scan quality is good. Site Region Measured Measured WHO Young Adult BMD Date       Age      Classification T-score AP Spine L1-L3 02/23/2023 66.2 Osteopenia -1.7 0.971 g/cm2 DualFemur Neck Left 02/23/2023 66.2 Osteopenia -2.3 0.725 g/cm2 World Health Organization [facility]) criteria for post-menopausal, Caucasian Women: Normal:       T-score at or above -1 SD Osteopenia:   T-score between -1 and -2.5 SD Osteoporosis: T-score at or below -2.5 SD RECOMMENDATIONS: 1. All patients should optimize calcium and vitamin D intake. 2. Consider FDA-approved medical therapies in postmenopausal women and men aged 41 years and older, based on the following: a. A hip or vertebral (clinical or morphometric) fracture b. T-score < -2.5 at the femoral neck or spine after appropriate evaluation to exclude secondary causes c. Low bone mass (T-score between -1.0 and -2.5 at the femoral neck or spine) and a 10-year probability of a hip fracture > 3% or a 10-year probability of a major osteoporosis-related fracture > 20% based on the [place]-adapted WHO algorithm d. Clinician judgment and/or patient preferences may indicate treatment for people with 10-year fracture probabilities above or below these levels FOLLOW-UP: People with diagnosed cases of osteoporosis or at high risk for fracture should have regular bone mineral density tests. For patients eligible for Medicare, routine testing is allowed once every 2  years. The testing frequency can be increased to one year for patients who have rapidly progressing disease, those who are receiving or discontinuing medical therapy to restore bone mass, or have additional risk factors. I have reviewed this report, and agree with the above findings. [facility] Radiology Your patient ALEJA PHIFER completed a FRAX assessment on 02/23/2023 using the [employer] (analysis version: 14.10) manufactured by [employer]. The following summarizes the results of our evaluation. Technologist: LCE PATIENT BIOGRAPHICAL: Name: Ireanna, Taiwo Patient ID: [id number] Birth Date: November 01, 1956 Height:    60.5 in. Gender:     Female    [age]       Weight:    128.8 lbs. Ethnicity:  White                            Exam Date: 02/23/2023 FRAX* RESULTS:  (version: 3.5) 10-year Probability  of Fracture1 Major Osteoporotic Fracture2 Hip Fracture 12.4% 2.4% Population: [place] (Caucasian) Risk Factors: None Based on Femur (Left) Neck BMD 1 -The 10-year probability of fracture may be lower than reported if the patient has received treatment. 2 -Major Osteoporotic Fracture: Clinical Spine, Forearm, Hip or Shoulder *FRAX is a Materials engineer of the [employer] for Metabolic Bone Disease, a [place] (WHO) [facility]. ASSESSMENT: The probability of a major osteoporotic fracture is 12.4% within the next ten years. The probability of a hip fracture is 2.4% within the next ten years. Electronically Signed   By: Zerita Boers M.D.   On: 02/23/2023 11:03     Assessment and plan- Patient is a 67 y.o. female  with metastatic breast cancer ER/PR positive HER2 negative to the right hilum and right lower lobe lung nodule.  This is a routine follow-up visit  Patient is currently on her week of Ibrance.  ANC is 1.  Okay to proceed with Ibrance in 2 days time.  I will repeat her labs on a monthly basis and see her back in 3 months.   She will be in [place] in 1 month's time and will be getting her blood work checked at [facility] and faxed to [place].  She will take letrozole along with calcium and vitamin D until progression or toxicity.  I will plan to repeat scans sometime in June 2024.  Overall patient has low-volume disease with a solitary right lower lobe lung nodule and intrabronchial mass which was not clearly seen on CT scan.  Tumor markers are also normal.   Visit Diagnosis 1. High risk medication use   2. Use of letrozole (Femara)   3. Malignant neoplasm of breast metastatic to lung [facility])      Dr. Randa Evens, MD, MPH [facility] at [facility] [id number] 03/14/2023 4:16 PM

## 2023-03-14 NOTE — Progress Notes (Signed)
Patient wants to know how much more blood work she has to do, she is traveling to [place] for 1 month.

## 2023-03-15 LAB — CANCER ANTIGEN 27.29: CA 27.29: 13.1 U/mL (ref 0.0–38.6)

## 2023-03-15 NOTE — Progress Notes (Signed)
Enrolled patient into the [employer] 

## 2023-03-16 LAB — CANCER ANTIGEN 15-3: CA 15-3: 14.4 U/mL (ref 0.0–25.0)

## 2023-04-14 DIAGNOSIS — C50919 Malignant neoplasm of unspecified site of unspecified female breast: Secondary | ICD-10-CM | POA: Diagnosis not present

## 2023-04-14 DIAGNOSIS — C7800 Secondary malignant neoplasm of unspecified lung: Secondary | ICD-10-CM | POA: Diagnosis not present

## 2023-04-17 ENCOUNTER — Other Ambulatory Visit: Payer: Self-pay | Admitting: *Deleted

## 2023-04-17 ENCOUNTER — Other Ambulatory Visit: Payer: Self-pay | Admitting: Family Medicine

## 2023-04-17 ENCOUNTER — Encounter: Payer: Self-pay | Admitting: Urgent Care

## 2023-04-17 DIAGNOSIS — C7800 Secondary malignant neoplasm of unspecified lung: Secondary | ICD-10-CM

## 2023-04-18 NOTE — Telephone Encounter (Signed)
Requested medication (s) are due for refill today - yes  Requested medication (s) are on the active medication list -yes  Future visit scheduled -no  Last refill: 12/30/22 #30 1RF  Notes to clinic: non delegated Rx  Requested Prescriptions  Pending Prescriptions Disp Refills   diazepam (VALIUM) 10 MG tablet [Pharmacy Med Name: diazePAM 10 MG TABLET] 30 tablet     Sig: TAKE ONE TABLET BY MOUTH EVERY 12 HOURS AS NEEDED FOR ANXIETY     Not Delegated - Psychiatry: Anxiolytics/Hypnotics 2 Failed - 04/17/2023  4:17 PM      Failed - This refill cannot be delegated      Failed - Urine Drug Screen completed in last 360 days      Failed - Valid encounter within last 6 months    Recent Outpatient Visits           11 months ago Lung mass   [facility] Pickard, Priscille Heidelberg, MD   1 year ago General medical exam   [facility] Donita Brooks, MD   2 years ago Dysuria   [facility] Tanya Nones, Priscille Heidelberg, MD   2 years ago General medical exam   [facility] Donita Brooks, MD   2 years ago Chest pain at rest   [facility] Pickard, Priscille Heidelberg, MD              Passed - Patient is not pregnant         Requested Prescriptions  Pending Prescriptions Disp Refills   diazepam (VALIUM) 10 MG tablet [Pharmacy Med Name: diazePAM 10 MG TABLET] 30 tablet     Sig: TAKE ONE TABLET BY MOUTH EVERY 12 HOURS AS NEEDED FOR ANXIETY     Not Delegated - Psychiatry: Anxiolytics/Hypnotics 2 Failed - 04/17/2023  4:17 PM      Failed - This refill cannot be delegated      Failed - Urine Drug Screen completed in last 360 days      Failed - Valid encounter within last 6 months    Recent Outpatient Visits           11 months ago Lung mass   [facility] Pickard, Priscille Heidelberg, MD   1 year ago General medical exam   [facility] Donita Brooks, MD   2 years ago Dysuria   [facility] Donita Brooks, MD   2 years ago General medical exam   [facility] Donita Brooks, MD   2 years ago Chest pain at rest   [facility] Pickard, Priscille Heidelberg, MD              Passed - Patient is not pregnant

## 2023-05-03 ENCOUNTER — Ambulatory Visit (INDEPENDENT_AMBULATORY_CARE_PROVIDER_SITE_OTHER): Payer: Medicare Other | Admitting: Family Medicine

## 2023-05-03 ENCOUNTER — Encounter: Payer: Self-pay | Admitting: Family Medicine

## 2023-05-03 VITALS — BP 130/68 | HR 57 | Temp 98.1°F | Ht 61.0 in | Wt 128.1 lb

## 2023-05-03 DIAGNOSIS — W57XXXA Bitten or stung by nonvenomous insect and other nonvenomous arthropods, initial encounter: Secondary | ICD-10-CM

## 2023-05-03 DIAGNOSIS — S80261A Insect bite (nonvenomous), right knee, initial encounter: Secondary | ICD-10-CM

## 2023-05-03 NOTE — Progress Notes (Signed)
   Acute Office Visit  Subjective:     Patient ID: Monique [name], female    DOB: 04-13-1956, 67 y.o.   MRN: [id number]  Chief Complaint  Patient presents with   Follow-up    bit by tick with white dot on back    HPI Patient is in today for tick bite yesterday on the back of her right knee. The tick was fully removed and was not engorged, she does not know how long it was attached. She has a mild red rash surrounding the site. Denies fever or facial edema. She is on her third week of Ibrance for her cancer and is fatigued and has nauseous with this.   Review of Systems  All other systems reviewed and are negative.       Objective:    BP 130/68   Pulse (!) 57   Temp 98.1 F (36.7 C) (Oral)   Ht 5\' 1"  (1.549 m)   Wt 128 lb 1 oz (58.1 kg)   SpO2 99%   BMI 24.20 kg/m    Physical Exam Vitals and nursing note reviewed.  Constitutional:      Appearance: Normal appearance. She is normal weight.  HENT:     Head: Normocephalic and atraumatic.  Skin:    General: Skin is warm and dry.     Findings: Erythema present.       Neurological:     General: No focal deficit present.     Mental Status: She is alert and oriented to person, place, and time. Mental status is at baseline.  Psychiatric:        Mood and Affect: Mood normal.        Behavior: Behavior normal.        Thought Content: Thought content normal.        Judgment: Judgment normal.     No results found for any visits on 05/03/23.      Assessment & Plan:   Problem List Items Addressed This Visit     Tick bite of right knee - Primary    Mild erythema to right posterior knee approximately 2cm diameter. No systemic symptoms. This was a lone star tick. Patient is concerned about alpha gal, we discussed signs and symptoms and when to return to office for testing. Return to office if abdominal redness does not improve or for s/s of red meat intolerance.       No orders of the defined types were placed in  this encounter.   Return if symptoms worsen or fail to improve.  Park Meo, FNP

## 2023-05-03 NOTE — Assessment & Plan Note (Signed)
Mild erythema to right posterior knee approximately 2cm diameter. No systemic symptoms. This was a lone star tick. Patient is concerned about alpha gal, we discussed signs and symptoms and when to return to office for testing. Return to office if abdominal redness does not improve or for s/s of red meat intolerance.

## 2023-05-05 ENCOUNTER — Telehealth: Payer: Self-pay

## 2023-05-05 NOTE — Telephone Encounter (Signed)
Pt asks if she can have her [facility] for [facility] drawn here and the results faxed to them? Pt will need a CBC and a CMET. Thank you.

## 2023-05-07 ENCOUNTER — Other Ambulatory Visit: Payer: Self-pay | Admitting: Oncology

## 2023-05-09 ENCOUNTER — Encounter: Payer: Self-pay | Admitting: Oncology

## 2023-05-09 ENCOUNTER — Other Ambulatory Visit: Payer: Self-pay | Admitting: *Deleted

## 2023-05-09 DIAGNOSIS — R519 Headache, unspecified: Secondary | ICD-10-CM

## 2023-05-09 DIAGNOSIS — C50919 Malignant neoplasm of unspecified site of unspecified female breast: Secondary | ICD-10-CM

## 2023-05-09 DIAGNOSIS — Z79899 Other long term (current) drug therapy: Secondary | ICD-10-CM

## 2023-05-09 DIAGNOSIS — R5383 Other fatigue: Secondary | ICD-10-CM

## 2023-05-10 ENCOUNTER — Other Ambulatory Visit: Payer: Medicare Other

## 2023-05-10 DIAGNOSIS — C50919 Malignant neoplasm of unspecified site of unspecified female breast: Secondary | ICD-10-CM | POA: Diagnosis not present

## 2023-05-10 DIAGNOSIS — K625 Hemorrhage of anus and rectum: Secondary | ICD-10-CM

## 2023-05-10 DIAGNOSIS — C7800 Secondary malignant neoplasm of unspecified lung: Secondary | ICD-10-CM | POA: Diagnosis not present

## 2023-05-10 DIAGNOSIS — H8109 Meniere's disease, unspecified ear: Secondary | ICD-10-CM

## 2023-05-10 DIAGNOSIS — R739 Hyperglycemia, unspecified: Secondary | ICD-10-CM

## 2023-05-10 DIAGNOSIS — R1010 Upper abdominal pain, unspecified: Secondary | ICD-10-CM

## 2023-05-10 DIAGNOSIS — R918 Other nonspecific abnormal finding of lung field: Secondary | ICD-10-CM

## 2023-05-10 DIAGNOSIS — R911 Solitary pulmonary nodule: Secondary | ICD-10-CM | POA: Diagnosis not present

## 2023-05-11 ENCOUNTER — Encounter: Payer: Self-pay | Admitting: Family Medicine

## 2023-05-11 LAB — COMPREHENSIVE METABOLIC PANEL
AG Ratio: 1.8 (calc) (ref 1.0–2.5)
ALT: 10 U/L (ref 6–29)
AST: 14 U/L (ref 10–35)
Albumin: 4.5 g/dL (ref 3.6–5.1)
Alkaline phosphatase (APISO): 51 U/L (ref 37–153)
BUN: 9 mg/dL (ref 7–25)
CO2: 24 mmol/L (ref 20–32)
Calcium: 9.1 mg/dL (ref 8.6–10.4)
Chloride: 103 mmol/L (ref 98–110)
Creat: 0.86 mg/dL (ref 0.50–1.05)
Globulin: 2.5 g/dL (calc) (ref 1.9–3.7)
Glucose, Bld: 86 mg/dL (ref 65–99)
Potassium: 4.5 mmol/L (ref 3.5–5.3)
Sodium: 137 mmol/L (ref 135–146)
Total Bilirubin: 0.4 mg/dL (ref 0.2–1.2)
Total Protein: 7 g/dL (ref 6.1–8.1)

## 2023-05-11 LAB — CBC WITH DIFFERENTIAL/PLATELET
Absolute Monocytes: 201 cells/uL (ref 200–950)
Basophils Absolute: 99 cells/uL (ref 0–200)
Basophils Relative: 3 %
Eosinophils Absolute: 142 cells/uL (ref 15–500)
Eosinophils Relative: 4.3 %
HCT: 36.8 % (ref 35.0–45.0)
Hemoglobin: 12.2 g/dL (ref 11.7–15.5)
Lymphs Abs: 1911 cells/uL (ref 850–3900)
MCH: 31.3 pg (ref 27.0–33.0)
MCHC: 33.2 g/dL (ref 32.0–36.0)
MCV: 94.4 fL (ref 80.0–100.0)
MPV: 9.4 fL (ref 7.5–12.5)
Monocytes Relative: 6.1 %
Neutro Abs: 947 cells/uL — ABNORMAL LOW (ref 1500–7800)
Neutrophils Relative %: 28.7 %
Platelets: 257 10*3/uL (ref 140–400)
RBC: 3.9 10*6/uL (ref 3.80–5.10)
RDW: 17.3 % — ABNORMAL HIGH (ref 11.0–15.0)
Total Lymphocyte: 57.9 %
WBC: 3.3 10*3/uL — ABNORMAL LOW (ref 3.8–10.8)

## 2023-05-12 ENCOUNTER — Encounter: Payer: Self-pay | Admitting: *Deleted

## 2023-05-17 ENCOUNTER — Other Ambulatory Visit: Payer: Self-pay | Admitting: *Deleted

## 2023-05-17 DIAGNOSIS — C7800 Secondary malignant neoplasm of unspecified lung: Secondary | ICD-10-CM

## 2023-05-21 IMAGING — CT NM PET TUM IMG INITIAL (PI) SKULL BASE T - THIGH
7 series · 25 of 25 positions shown · non-contrast
Comparison: Chest CT 01/19/2022.

CLINICAL DATA: Initial treatment strategy for lung cancer screening
CT demonstrating soft tissue fullness in the right perihilar region
with pulmonary nodules.

EXAM:
NUCLEAR MEDICINE PET SKULL BASE TO THIGH
TECHNIQUE: 5.9 mCi F-18 FDG was injected intravenously. Full-ring PET imaging
was performed from the skull base to thigh after the radiotracer. CT
data was obtained and used for attenuation correction and anatomic
localization.
Fasting blood glucose: 97 mg/dl

[Series 3: pet sk_thigh ac · axial · 5.0mm · 4.07mm/px · z∈[-722,+138]mm · 6 of 216 slices shown]
[im 1/216]
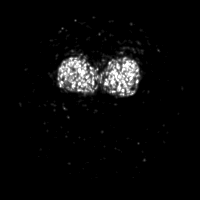
[im 44/216]
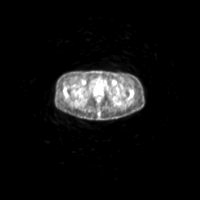
[im 87/216]
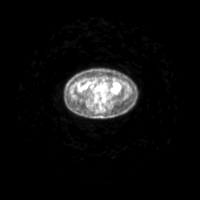
[im 130/216]
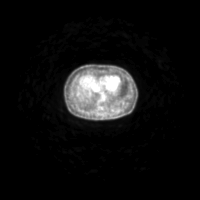
[im 173/216]
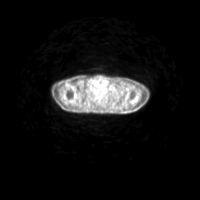
[im 216/216]
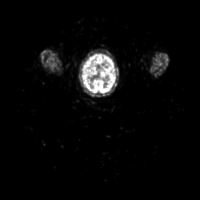

[Series 4: ct sk_thigh 5.0 br38 · axial · 5.0mm · 0.83mm/px · z∈[-722,+138]mm · 6 of 216 slices shown]
[im 1/216]
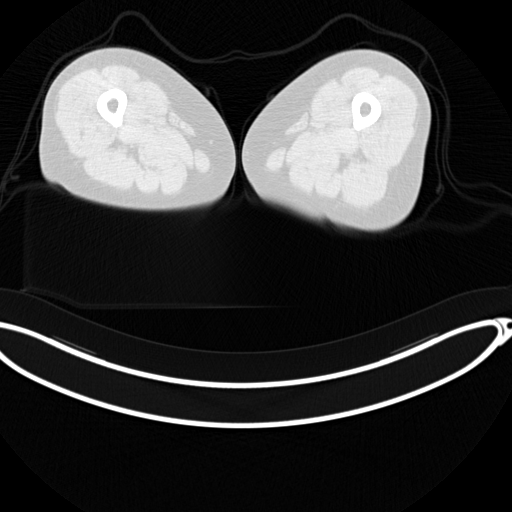
[im 44/216]
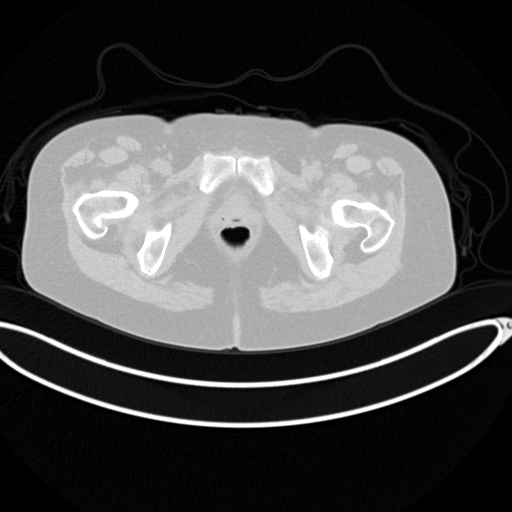
[im 87/216]
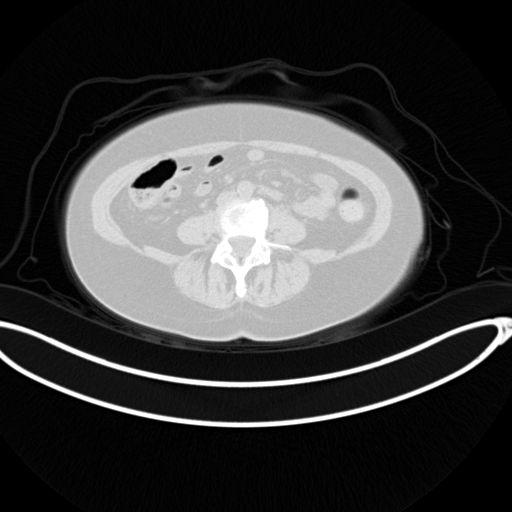
[im 130/216]
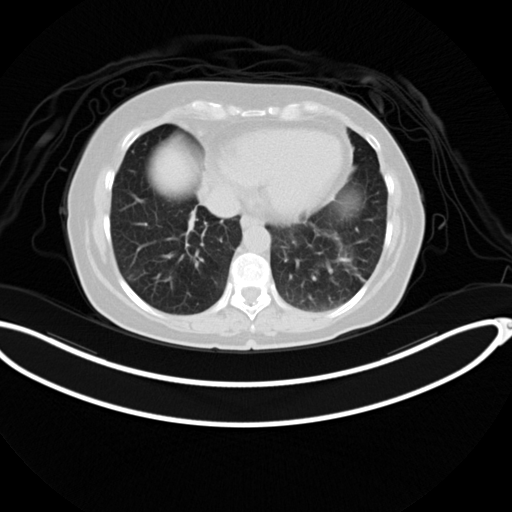
[im 173/216]
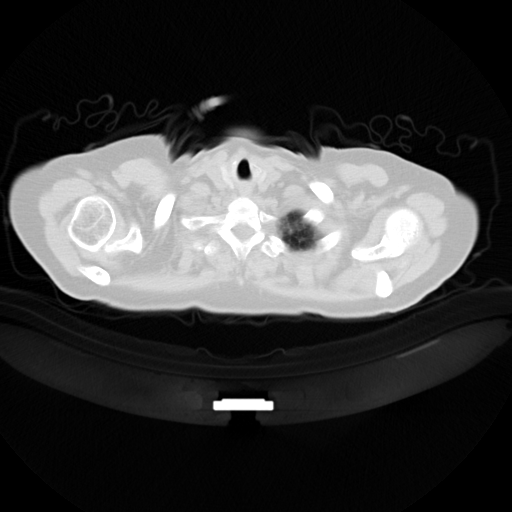
[im 216/216  brain]
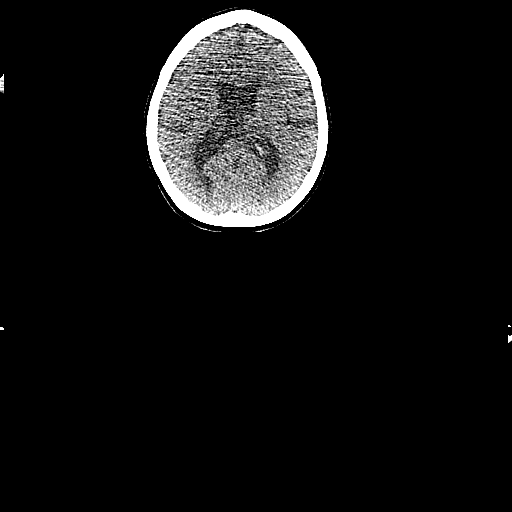

[Series 5: pet sk_thigh nac · axial · 5.0mm · 4.07mm/px · z∈[-722,+138]mm · 5 of 216 slices shown]
[im 1/216]
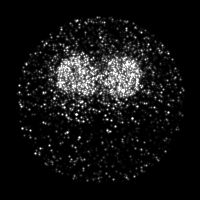
[im 54/216]
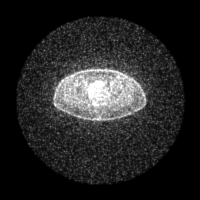
[im 108/216]
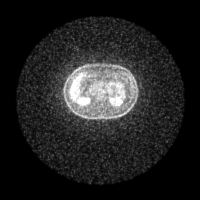
[im 162/216]
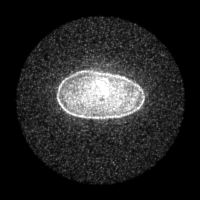
[im 216/216]
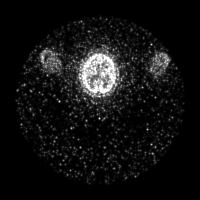

[Series 7: ct 5.0 bl57 lung_bone · axial · 5.0mm · 0.53mm/px · 1 of 59 slices shown]
[im 1/59]
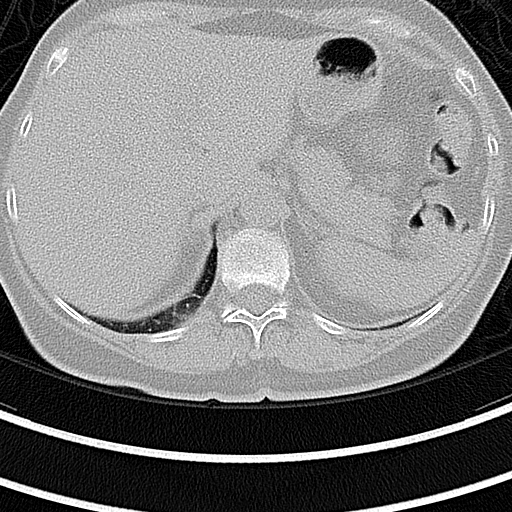

[Series 604: fused tra · 5 of 210 slices shown]
[im 1/210]
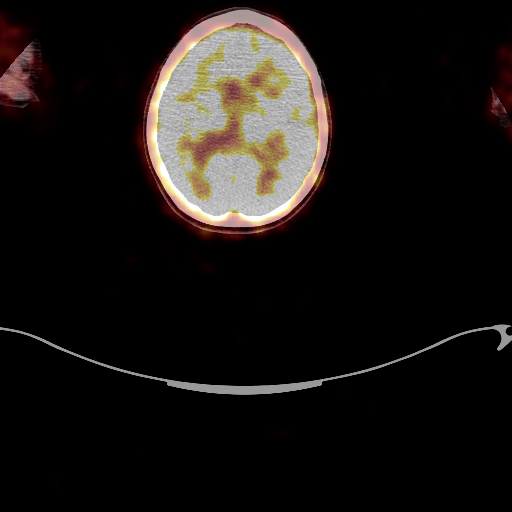
[im 53/210]
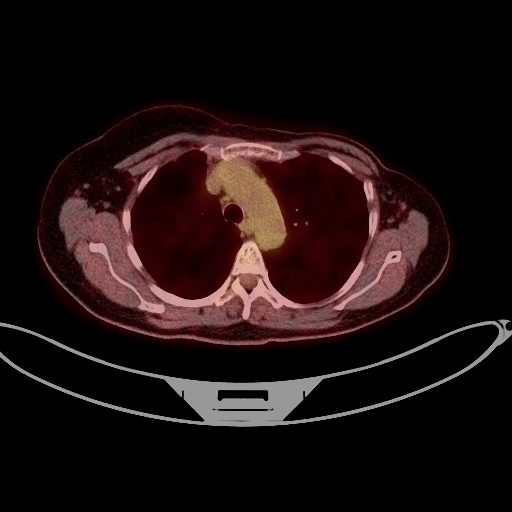
[im 105/210]
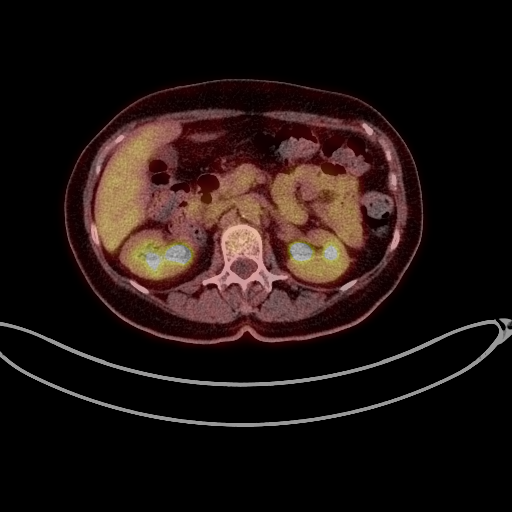
[im 157/210]
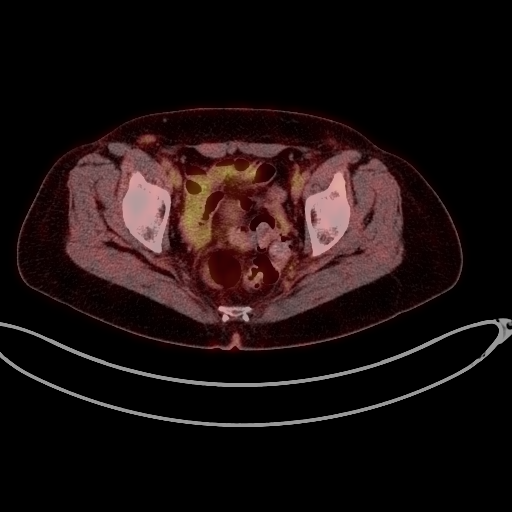
[im 210/210]
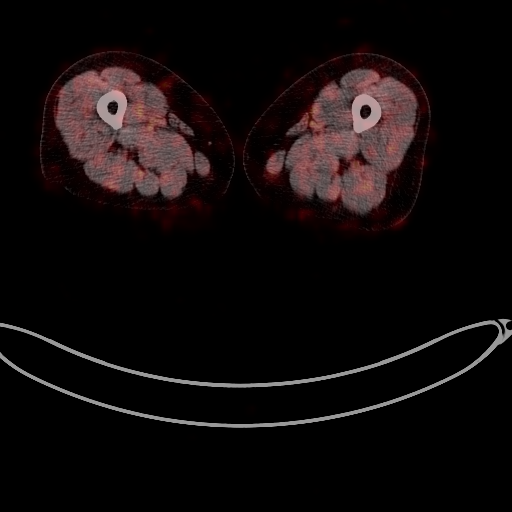

[Series 605: fused cor · 1 of 56 slices shown]
[im 1/56]
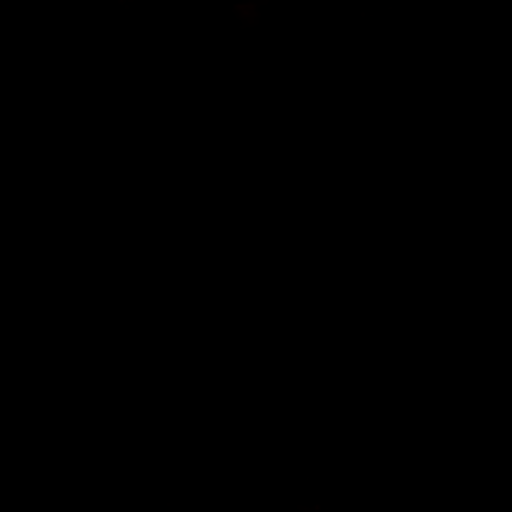

[Series 606: mip pet · coronal · 1.79mm/px · 1 of 32 slices shown]
[im 1/32]
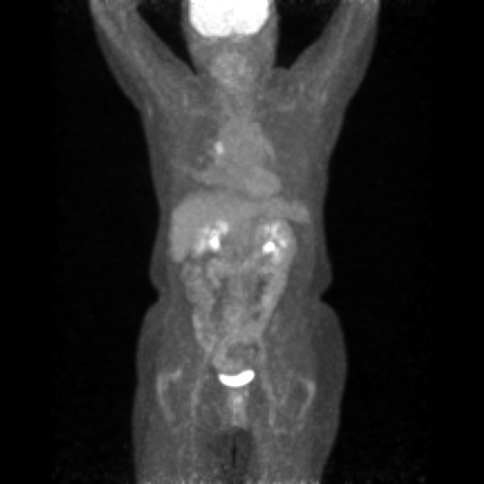

[25 of 25 positions shown; findings below may reference images not displayed]

FINDINGS: Mediastinal blood pool activity: SUV max

Liver activity: SUV max NA

NECK: No areas of abnormal hypermetabolism.

Incidental CT findings: No cervical adenopathy.

CHEST: Hypermetabolism corresponding to the soft tissue fullness in
the right perihilar region, just anterior to the central right upper
lobe bronchus. Example at a S.U.V. max of 6.2 on 66/4.

Low-level, nonfocal activity within the secondarily partially
collapsed anterior right upper lobe. Example a S.U.V. max of 2.2 on
image [DATE].

No hypermetabolism to correspond to the right lower lobe pulmonary
nodules detailed on prior CT.

Incidental CT findings: Centrilobular emphysema. Bibasilar scarring.
Scattered pulmonary nodules have been detailed previously. Example
central right lower lobe at 8 mm on 41/7. Aortic and coronary artery
calcification.

ABDOMEN/PELVIS: No abdominopelvic parenchymal or nodal
hypermetabolism.

Incidental CT findings: 2.4 cm right renal low-density lesion is
consistent with a cyst . No f/up imaging recommended. Normal
noncontrast appearance of the liver, gallbladder, spleen, stomach,
pancreas, adrenal glands, left kidney, urinary bladder, uterus,
adnexa. Abdominal aortic atherosclerosis. No bowel obstruction.

SKELETON: No abnormal marrow activity.

Incidental CT findings: none
IMPRESSION: 1. Focal moderate hypermetabolism corresponding to the area of right
perihilar soft tissue fullness, suspicious for nodal or central
right upper lobe primary bronchogenic carcinoma. Consider
bronchoscopy with attention to the soft tissues anterior to the
central right upper lobe bronchus.
2. No findings of hypermetabolic extrathoracic disease. Pulmonary
nodules as detailed on prior lung cancer screening CT, below PET
resolution.
3. Incidental findings, including: Aortic atherosclerosis
(P53UD-AP7.7), coronary artery atherosclerosis and emphysema
([id number]).

## 2023-05-26 ENCOUNTER — Other Ambulatory Visit: Payer: Self-pay | Admitting: Oncology

## 2023-05-26 ENCOUNTER — Other Ambulatory Visit: Payer: Self-pay | Admitting: Family Medicine

## 2023-05-26 NOTE — Telephone Encounter (Signed)
Requested medications are due for refill today.  yes  Requested medications are on the active medications list.  yes  Last refill. 04/18/2023 #30 0 rf  Future visit scheduled.   yes  Notes to clinic.  Refill not delegated.    Requested Prescriptions  Pending Prescriptions Disp Refills   diazepam (VALIUM) 10 MG tablet [Pharmacy Med Name: diazePAM 10 MG TABLET] 30 tablet     Sig: TAKE ONE TABLET BY MOUTH EVERY 12 HOURS AS NEEDED FOR ANXIETY     Not Delegated - Psychiatry: Anxiolytics/Hypnotics 2 Failed - 05/26/2023  3:53 PM      Failed - This refill cannot be delegated      Failed - Urine Drug Screen completed in last 360 days      Failed - Valid encounter within last 6 months    Recent Outpatient Visits           1 year ago Lung mass   [facility] Pickard, Priscille Heidelberg, MD   1 year ago General medical exam   [facility] Donita Brooks, MD   2 years ago Dysuria   [facility] Donita Brooks, MD   2 years ago General medical exam   [facility] Donita Brooks, MD   2 years ago Chest pain at rest   [facility] Pickard, Priscille Heidelberg, MD       Future Appointments             In 8 months Pickard, Priscille Heidelberg, MD [facility], [facility]            Passed - Patient is not pregnant

## 2023-05-29 ENCOUNTER — Ambulatory Visit: Payer: Medicare Other

## 2023-05-30 ENCOUNTER — Other Ambulatory Visit: Payer: Medicare Other

## 2023-05-30 ENCOUNTER — Ambulatory Visit: Payer: Medicare Other

## 2023-06-01 ENCOUNTER — Telehealth: Payer: Self-pay | Admitting: *Deleted

## 2023-06-01 NOTE — Telephone Encounter (Signed)
Patient called statintg hat her PET got cancelled due to insurance denial, but she has gotten that overturned and it is now approved and she states that she needs a new order entered so it can be scheduled. Please return her call once ordered she said she would like to schedule her own date

## 2023-06-01 NOTE — Telephone Encounter (Signed)
If pet is now approved- we can get that done instead of CT

## 2023-06-02 ENCOUNTER — Other Ambulatory Visit: Payer: Self-pay | Admitting: *Deleted

## 2023-06-02 DIAGNOSIS — C7800 Secondary malignant neoplasm of unspecified lung: Secondary | ICD-10-CM

## 2023-06-05 ENCOUNTER — Other Ambulatory Visit: Payer: Medicare Other

## 2023-06-05 DIAGNOSIS — C50919 Malignant neoplasm of unspecified site of unspecified female breast: Secondary | ICD-10-CM

## 2023-06-05 DIAGNOSIS — R911 Solitary pulmonary nodule: Secondary | ICD-10-CM

## 2023-06-05 DIAGNOSIS — C7800 Secondary malignant neoplasm of unspecified lung: Secondary | ICD-10-CM | POA: Diagnosis not present

## 2023-06-05 LAB — CBC WITH DIFFERENTIAL/PLATELET
Absolute Monocytes: 238 cells/uL (ref 200–950)
Basophils Absolute: 101 cells/uL (ref 0–200)
Basophils Relative: 2.3 %
Eosinophils Absolute: 158 cells/uL (ref 15–500)
Eosinophils Relative: 3.6 %
HCT: 33.7 % — ABNORMAL LOW (ref 35.0–45.0)
Hemoglobin: 11.3 g/dL — ABNORMAL LOW (ref 11.7–15.5)
Lymphs Abs: 2600 cells/uL (ref 850–3900)
MCH: 32.2 pg (ref 27.0–33.0)
MCHC: 33.5 g/dL (ref 32.0–36.0)
MCV: 96 fL (ref 80.0–100.0)
MPV: 9.7 fL (ref 7.5–12.5)
Monocytes Relative: 5.4 %
Neutro Abs: 1302 cells/uL — ABNORMAL LOW (ref 1500–7800)
Neutrophils Relative %: 29.6 %
Platelets: 241 10*3/uL (ref 140–400)
RBC: 3.51 10*6/uL — ABNORMAL LOW (ref 3.80–5.10)
RDW: 14.8 % (ref 11.0–15.0)
Total Lymphocyte: 59.1 %
WBC: 4.4 10*3/uL (ref 3.8–10.8)

## 2023-06-07 ENCOUNTER — Other Ambulatory Visit: Payer: Medicare Other

## 2023-06-13 ENCOUNTER — Ambulatory Visit
Admission: RE | Admit: 2023-06-13 | Discharge: 2023-06-13 | Disposition: A | Discharge status: Home / Self Care | Payer: Medicare Other | Source: Ambulatory Visit | Attending: Oncology | Admitting: Oncology

## 2023-06-13 DIAGNOSIS — C7801 Secondary malignant neoplasm of right lung: Secondary | ICD-10-CM | POA: Diagnosis not present

## 2023-06-13 DIAGNOSIS — C7800 Secondary malignant neoplasm of unspecified lung: Secondary | ICD-10-CM | POA: Insufficient documentation

## 2023-06-13 DIAGNOSIS — I700 Atherosclerosis of aorta: Secondary | ICD-10-CM | POA: Diagnosis not present

## 2023-06-13 DIAGNOSIS — I2510 Atherosclerotic heart disease of native coronary artery without angina pectoris: Secondary | ICD-10-CM | POA: Insufficient documentation

## 2023-06-13 DIAGNOSIS — C50919 Malignant neoplasm of unspecified site of unspecified female breast: Secondary | ICD-10-CM | POA: Diagnosis not present

## 2023-06-13 DIAGNOSIS — J439 Emphysema, unspecified: Secondary | ICD-10-CM | POA: Insufficient documentation

## 2023-06-13 LAB — GLUCOSE, CAPILLARY: Glucose-Capillary: 83 mg/dL (ref 70–99)

## 2023-06-13 MED ORDER — FLUDEOXYGLUCOSE F - 18 (FDG) INJECTION
6.6000 | Freq: Once | INTRAVENOUS | Status: AC | PRN
  Administered 2023-06-13: 6.94 via INTRAVENOUS

## 2023-06-15 ENCOUNTER — Inpatient Hospital Stay: Payer: Medicare Other | Attending: Hospice and Palliative Medicine | Admitting: Hospice and Palliative Medicine

## 2023-06-15 ENCOUNTER — Encounter: Payer: Self-pay | Admitting: Oncology

## 2023-06-15 ENCOUNTER — Telehealth: Payer: Self-pay | Admitting: *Deleted

## 2023-06-15 DIAGNOSIS — R319 Hematuria, unspecified: Secondary | ICD-10-CM | POA: Diagnosis not present

## 2023-06-15 DIAGNOSIS — C50919 Malignant neoplasm of unspecified site of unspecified female breast: Secondary | ICD-10-CM | POA: Diagnosis not present

## 2023-06-15 DIAGNOSIS — C7800 Secondary malignant neoplasm of unspecified lung: Secondary | ICD-10-CM | POA: Diagnosis not present

## 2023-06-15 DIAGNOSIS — N390 Urinary tract infection, site not specified: Secondary | ICD-10-CM | POA: Diagnosis not present

## 2023-06-15 DIAGNOSIS — R42 Dizziness and giddiness: Secondary | ICD-10-CM | POA: Diagnosis not present

## 2023-06-15 NOTE — Progress Notes (Signed)
Virtual Visit via Telephone Note  I connected with Monique [name] on 06/15/23 at  3:40 PM EDT by telephone and verified that I am speaking with the correct person using two identifiers.  Location: Patient: Home Provider: Clinic   I discussed the limitations, risks, security and privacy concerns of performing an evaluation and management service by telephone and the availability of in person appointments. I also discussed with the patient that there may be a patient responsible charge related to this service. The patient expressed understanding and agreed to proceed.   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  Patient currently on treatment with Ibrance.   Observations/Objective: Patient called in clinic requesting to speak with someone about hospice.  I spoke with patient by phone.  She states that she was doing reasonably well until Monday when she started having urinary frequency, malodorous urine, and persistent dizziness.  Patient says that she has been mostly in the bed since Monday.  Denies fever or chills.  No GI symptoms.  No pain.  Patient says that she saw a provider at urgent care today who diagnosed her with a UTI and sent a prescription for Macrobid.  Patient says the provider also recommended that patient transition to hospice care.  Assessment and Plan: Stage IV breast cancer - Had a long conversation with patient about her overall goals.  At her request, we also reviewed the results of the PET scan, which show interval improvement secondary to treatment.  Patient says that she is not interested in hospice at this time and would appropriately like to continue cancer treatment.  She intends to restart the Ibrance per her usual schedule.  UTI/dizziness - It does sound like her symptoms could be secondary to UTI and I recommended that patient  start taking the Macrobid as prescribed by urgent care provider.   Recommended increased oral fluids.  Follow Up Instructions: Patient declined my offer for in person [facility] visit.  She says that she will let us know if symptoms do not improve.    I discussed the assessment and treatment plan with the patient. The patient was provided an opportunity to ask questions and all were answered. The patient agreed with the plan and demonstrated an understanding of the instructions.   The patient was advised to call back or seek an in-person evaluation if the symptoms worsen or if the condition fails to improve as anticipated.  I provided 15 minutes of non-face-to-face time during this encounter.   Malachy Moan, NP

## 2023-06-15 NOTE — Telephone Encounter (Signed)
Patient called in today right at lunchtime saying that she wants to get her PET scan results as well as urgent care that she went to suggested hospice for her.  I called her back and I asked her why she thought she should be in hospice and she said because that she has not been feeling good dizzy and she wanted to get the PET scan so she can know if there is anything going on in the brain because the urgent care people told him that dizziness is is is a sign that the brain does have an issue.  It is not clear whether or not she has determined that she might need hospice because I cannot read all of the information from the urgent [facility] care at this time but what I did to read it did not say hospice.  I also asked her since she is dizzy sometimes which she like to come in and get seen by [facility] and she says no she does not want to I asked her if she was drinking and eating good and she said yes but then her to get the PET scan she had to drink a lot of water and so I told her that sometimes that is a sign of dehydration but she continues to tell me that she is drinking plenty of water the other thing is is that she has not been taking any of her Ilda Foil since Monday so she has not had it Monday Tuesday Wednesday or Thursday.  I spoke to [facility] and he has a telephone visit with her today at 43 and the patient was okay with this

## 2023-06-19 ENCOUNTER — Encounter: Payer: Self-pay | Admitting: Hospice and Palliative Medicine

## 2023-07-04 ENCOUNTER — Encounter: Payer: Self-pay | Admitting: Oncology

## 2023-07-04 ENCOUNTER — Inpatient Hospital Stay (HOSPITAL_BASED_OUTPATIENT_CLINIC_OR_DEPARTMENT_OTHER): Payer: Medicare Other | Admitting: Oncology

## 2023-07-04 ENCOUNTER — Inpatient Hospital Stay: Payer: Medicare Other | Attending: Hospice and Palliative Medicine

## 2023-07-04 ENCOUNTER — Other Ambulatory Visit: Payer: Self-pay | Admitting: *Deleted

## 2023-07-04 VITALS — BP 136/76 | HR 59 | Temp 96.9°F | Resp 18 | Ht 61.0 in | Wt 132.6 lb

## 2023-07-04 DIAGNOSIS — C50919 Malignant neoplasm of unspecified site of unspecified female breast: Secondary | ICD-10-CM | POA: Diagnosis not present

## 2023-07-04 DIAGNOSIS — M8580 Other specified disorders of bone density and structure, unspecified site: Secondary | ICD-10-CM | POA: Diagnosis not present

## 2023-07-04 DIAGNOSIS — Z170 Estrogen receptor positive status [ER+]: Secondary | ICD-10-CM | POA: Insufficient documentation

## 2023-07-04 DIAGNOSIS — Z8042 Family history of malignant neoplasm of prostate: Secondary | ICD-10-CM | POA: Diagnosis not present

## 2023-07-04 DIAGNOSIS — Z79811 Long term (current) use of aromatase inhibitors: Secondary | ICD-10-CM | POA: Insufficient documentation

## 2023-07-04 DIAGNOSIS — Z803 Family history of malignant neoplasm of breast: Secondary | ICD-10-CM | POA: Diagnosis not present

## 2023-07-04 DIAGNOSIS — Z87891 Personal history of nicotine dependence: Secondary | ICD-10-CM | POA: Diagnosis not present

## 2023-07-04 DIAGNOSIS — D702 Other drug-induced agranulocytosis: Secondary | ICD-10-CM | POA: Diagnosis not present

## 2023-07-04 DIAGNOSIS — C7800 Secondary malignant neoplasm of unspecified lung: Secondary | ICD-10-CM

## 2023-07-04 DIAGNOSIS — T451X5A Adverse effect of antineoplastic and immunosuppressive drugs, initial encounter: Secondary | ICD-10-CM | POA: Diagnosis not present

## 2023-07-04 DIAGNOSIS — D701 Agranulocytosis secondary to cancer chemotherapy: Secondary | ICD-10-CM | POA: Diagnosis not present

## 2023-07-04 DIAGNOSIS — Z7982 Long term (current) use of aspirin: Secondary | ICD-10-CM | POA: Insufficient documentation

## 2023-07-04 DIAGNOSIS — Z808 Family history of malignant neoplasm of other organs or systems: Secondary | ICD-10-CM | POA: Diagnosis not present

## 2023-07-04 DIAGNOSIS — Z79899 Other long term (current) drug therapy: Secondary | ICD-10-CM | POA: Diagnosis not present

## 2023-07-04 LAB — CBC WITH DIFFERENTIAL/PLATELET
Abs Immature Granulocytes: 0.01 10*3/uL (ref 0.00–0.07)
Basophils Absolute: 0.1 10*3/uL (ref 0.0–0.1)
Basophils Relative: 2 %
Eosinophils Absolute: 0.1 10*3/uL (ref 0.0–0.5)
Eosinophils Relative: 4 %
HCT: 34.6 % — ABNORMAL LOW (ref 36.0–46.0)
Hemoglobin: 11.3 g/dL — ABNORMAL LOW (ref 12.0–15.0)
Immature Granulocytes: 0 %
Lymphocytes Relative: 50 %
Lymphs Abs: 1.7 10*3/uL (ref 0.7–4.0)
MCH: 31.7 pg (ref 26.0–34.0)
MCHC: 32.7 g/dL (ref 30.0–36.0)
MCV: 96.9 fL (ref 80.0–100.0)
Monocytes Absolute: 0.2 10*3/uL (ref 0.1–1.0)
Monocytes Relative: 5 %
Neutro Abs: 1.3 10*3/uL — ABNORMAL LOW (ref 1.7–7.7)
Neutrophils Relative %: 39 %
Platelets: 238 10*3/uL (ref 150–400)
RBC: 3.57 MIL/uL — ABNORMAL LOW (ref 3.87–5.11)
RDW: 13.2 % (ref 11.5–15.5)
Smear Review: NORMAL
WBC: 3.3 10*3/uL — ABNORMAL LOW (ref 4.0–10.5)
nRBC: 0 % (ref 0.0–0.2)

## 2023-07-04 LAB — COMPREHENSIVE METABOLIC PANEL
ALT: 14 U/L (ref 0–44)
AST: 20 U/L (ref 15–41)
Albumin: 4.3 g/dL (ref 3.5–5.0)
Alkaline Phosphatase: 49 U/L (ref 38–126)
Anion gap: 11 (ref 5–15)
BUN: 12 mg/dL (ref 8–23)
CO2: 23 mmol/L (ref 22–32)
Calcium: 8.9 mg/dL (ref 8.9–10.3)
Chloride: 102 mmol/L (ref 98–111)
Creatinine, Ser: 0.96 mg/dL (ref 0.44–1.00)
GFR, Estimated: >60 mL/min (ref >60)
Glucose, Bld: 83 mg/dL (ref 70–99)
Potassium: 3.8 mmol/L (ref 3.5–5.1)
Sodium: 136 mmol/L (ref 135–145)
Total Bilirubin: 0.3 mg/dL (ref 0.3–1.2)
Total Protein: 7.4 g/dL (ref 6.5–8.1)

## 2023-07-04 NOTE — Progress Notes (Signed)
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  This was ER 100% positive, PR 95% positive HER2 negative Ki-67 15%.  She underwent left mastectomy and did not require any adjuvant radiation therapy.  Oncotype score came back low at 5 and therefore did not require any adjuvant chemotherapy.  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  Patient was initially started on Arimidex which she could not tolerate due to dizziness and was switched to tamoxifen which she could also not tolerate and subsequently stopped taking endocrine therapy shortly.  She was seen by both Dr. Pamelia Hoit and Dr. Mosetta Putt at [facility] back in 2018 and was subsequently lost to follow-up   Patient has been seeing Dr. Tonia Brooms this for evaluation of bilateral lung nodules and abnormal lung cancer screening CT noted in March 2023.  At that time she had a hypermetabolism with an SUV of 6 in the right hilum.  She had a bronchoscopy done and biopsies of the left bronchoscopy were negative as well as that of the hilum was negative.  There was appearance of a right lower lobe lung nodule which was being  followed given that it was spiculated.  There was a plan for repeat bronchoscopy in November 2023 but Dr. Tonia Brooms was sick at that time and patient did not wish to get bronchoscopy by any other pulmonary.  Bronchoscopy was therefore delayed and patient had PET CT scan in January 2024.  PET scan showed intense hypermetabolic activity again in the right hilum with no clear lesion.  Findings concerning for neoplasm obstructing the bronchus with the right middle lobe atelectasis.  Low level of metabolic activity in the right lower lobe.  No other evidence of distant metastatic disease.  Prior to that in October 2023 patient was noted to have bilateral lung nodules varying from 4 to 8 mm in size which could be potentially metastatic.   Had repeat bronchoscopy on 01/17/2023.  Right upper lobe endobronchial lesion brushing was positive for metastatic breast carcinoma.  Tumor cells positive for GATA3 and ER.  Negative for CK5/6, p40, TTF-1, chromogranin, Napsin synaptophysin and CD56.  Patient is consistent with metastatic carcinoma of breast origin. Tumor was 90% ER positive, 85% PR positive and HER2 negative. Patient started on letrozole in February 2024.  Ibrance started in March 2024.  Interval history-dizziness which has been ongoing for the last symptoms improving.  She still reports feeling lightheaded especially when she bends down.  She is keeping up with her fluid intake.  No recent falls.  Denies any pain.  Tolerating letrozole and Ibrance without side effects.  ECOG PS- 1 Pain scale- 0 Opioid associated constipation- no  Review of systems- Review of Systems  Constitutional:  Negative for chills, fever, malaise/fatigue and  weight loss.  HENT:  Negative for congestion, ear discharge and nosebleeds.   Eyes:  Negative for blurred vision.  Respiratory:  Negative for cough, hemoptysis, sputum production, shortness of breath and wheezing.   Cardiovascular:  Negative for chest pain, palpitations, orthopnea  and claudication.  Gastrointestinal:  Negative for abdominal pain, blood in stool, constipation, diarrhea, heartburn, melena, nausea and vomiting.  Genitourinary:  Negative for dysuria, flank pain, frequency, hematuria and urgency.  Musculoskeletal:  Negative for back pain, joint pain and myalgias.  Skin:  Negative for rash.  Neurological:  Positive for dizziness. Negative for tingling, focal weakness, seizures, weakness and headaches.  Endo/Heme/Allergies:  Does not bruise/bleed easily.  Psychiatric/Behavioral:  Negative for depression and suicidal ideas. The patient does not have insomnia.       Allergies  Allergen Reactions   Chantix [Varenicline]     Hallucination   Codeine Nausea And Vomiting   Wellbutrin [Bupropion] Other (See Comments)    Hallucinations       Past Medical History:  Diagnosis Date   Abnormal EKG    Anxiety    Phreesia 11/29/2020   Arthritis    Phreesia 11/29/2020   Barrett's esophagus    Breast cancer (HCC) 2018   Left breast   Cancer (HCC)    Phreesia 11/29/2020   Complication of anesthesia    Family history of breast cancer    Family history of prostate cancer    Family history of thyroid cancer    Fatigue    Generalized headaches    headaches are less since using a mouth piece to prevent grinding of teeth   Hepatitis    as a teenager   IBS (irritable bowel syndrome)    Meniere disease    Osteopenia    Pneumonia    PONV (postoperative nausea and vomiting)    Psoriasis    Tinnitus    left ear     Past Surgical History:  Procedure Laterality Date   BREAST SURGERY N/A    Phreesia 11/29/2020   BRONCHIAL BIOPSY  01/17/2023   Procedure: BRONCHIAL BIOPSIES;  Surgeon: Josephine Igo, DO;  Location: [facility];  Service: Pulmonary;;   BRONCHIAL BRUSHINGS  01/17/2023   Procedure: BRONCHIAL BRUSHINGS;  Surgeon: Josephine Igo, DO;  Location: [facility];  Service: Pulmonary;;   DILATION AND CURETTAGE OF UTERUS     FINE NEEDLE  ASPIRATION  05/10/2022   Procedure: FINE NEEDLE ASPIRATION (FNA) LINEAR;  Surgeon: Josephine Igo, DO;  Location: [facility];  Service: Pulmonary;;   MASTECTOMY Left 2018   MASTECTOMY W/ SENTINEL NODE BIOPSY Left 08/03/2017   Procedure: LEFT MASTECTOMY WITH LEFT AXILLARY SENTINEL LYMPH NODE BIOPSY;  Surgeon: Ovidio Kin, MD;  Location: [facility];  Service: General;  Laterality: Left;   skin cancer removal     TONSILLECTOMY     VIDEO BRONCHOSCOPY WITH ENDOBRONCHIAL ULTRASOUND N/A 05/10/2022   Procedure: VIDEO BRONCHOSCOPY WITH ENDOBRONCHIAL ULTRASOUND;  Surgeon: Josephine Igo, DO;  Location: [facility];  Service: Pulmonary;  Laterality: N/A;   VIDEO BRONCHOSCOPY WITH ENDOBRONCHIAL ULTRASOUND Right 01/17/2023   Procedure: VIDEO BRONCHOSCOPY WITH ENDOBRONCHIAL ULTRASOUND;  Surgeon: Josephine Igo, DO;  Location: [facility];  Service: Pulmonary;  Laterality: Right;    Social History   Socioeconomic History   Marital status: Married    Spouse name: Not on file   Number of children: Not on file   Years of education: Not on file   Highest education level: Associate degree: occupational,  technical, or vocational program  Occupational History   Not on file  Tobacco Use   Smoking status: Former    Current packs/day: 0.00    Types: Cigarettes    Quit date: 2018    Years since quitting: 6.5    Passive exposure: Past   Smokeless tobacco: Never  Vaping Use   Vaping status: Never Used  Substance and Sexual Activity   Alcohol use: Yes    Comment: occas   Drug use: Never   Sexual activity: Not Currently    Birth control/protection: Post-menopausal  Other Topics Concern   Not on file  Social History Narrative   Not on file   Social Determinants of Health   Financial Resource Strain: Low Risk  (05/02/2023)   Overall Financial Resource Strain (CARDIA)    Difficulty of Paying Living Expenses: Not very hard  Food Insecurity: No Food Insecurity (05/02/2023)    Hunger Vital Sign    Worried About Running Out of Food in the Last Year: Never true    Ran Out of Food in the Last Year: Never true  Transportation Needs: No Transportation Needs (05/02/2023)   PRAPARE - Administrator, Civil Service (Medical): No    Lack of Transportation (Non-Medical): No  Physical Activity: Sufficiently Active (05/02/2023)   Exercise Vital Sign    Days of Exercise per Week: 3 days    Minutes of Exercise per Session: 60 min  Stress: Stress Concern Present (05/02/2023)   [employer] of Occupational Health - Occupational Stress Questionnaire    Feeling of Stress : Very much  Social Connections: Moderately Integrated (05/02/2023)   Social Connection and Isolation Panel [NHANES]    Frequency of Communication with Friends and Family: More than three times a week    Frequency of Social Gatherings with Friends and Family: Twice a week    Attends Religious Services: More than 4 times per year    Active Member of [employer] or Organizations: No    Attends Engineer, structural: Not on file    Marital Status: Married  Catering manager Violence: Not At Risk (02/09/2023)   Humiliation, Afraid, Rape, and Kick questionnaire    Fear of Current or Ex-Partner: No    Emotionally Abused: No    Physically Abused: No    Sexually Abused: No    Family History  Problem Relation Age of Onset   Hypertension Mother    Heart attack Father    Hypertension Father    Healthy Maternal Aunt    Healthy Maternal Uncle    Breast cancer Paternal Aunt 26       bilateral   Healthy Paternal Aunt    Prostate cancer Paternal Uncle    Thyroid cancer Paternal Uncle    Healthy Paternal Uncle    Breast cancer Cousin 82       paternal first cousin   Breast cancer Cousin        paternal first cousin   Colon cancer Neg Hx    Esophageal cancer Neg Hx    Rectal cancer Neg Hx    Stomach cancer Neg Hx      Current Outpatient Medications:    albuterol (VENTOLIN HFA) 108 (90 Base)  MCG/ACT inhaler, Inhale into the lungs., Disp: , Rfl:    APPLE CIDER VINEGAR PO, Take 1 capsule by mouth daily., Disp: , Rfl:    aspirin EC 81 MG tablet, Take 1 tablet (81 mg total) by mouth daily. Swallow whole., Disp: 90 tablet,  Rfl: 3   atorvastatin (LIPITOR) 40 MG tablet, Take 1 tablet (40 mg total) by mouth daily., Disp: 90 tablet, Rfl: 3   CINNAMON PO, Take 1 capsule by mouth daily., Disp: , Rfl:    Coenzyme Q10 (COQ10 PO), Take 400 mg by mouth daily., Disp: , Rfl:    diazepam (VALIUM) 10 MG tablet, TAKE ONE TABLET BY MOUTH EVERY 12 HOURS AS NEEDED FOR ANXIETY, Disp: 30 tablet, Rfl: 3   escitalopram (LEXAPRO) 10 MG tablet, Take 1 tablet (10 mg total) by mouth daily., Disp: 30 tablet, Rfl: 3   FIBER ADULT GUMMIES PO, Take 2 tablets by mouth daily. Vitafusion Fiber Well Gummies, Disp: , Rfl:    letrozole (FEMARA) 2.5 MG tablet, TAKE 1 TABLET BY MOUTH DAILY, Disp: 90 tablet, Rfl: 0   Multiple Vitamins-Minerals (AIRBORNE PO), Take 1 tablet by mouth daily as needed (immune support)., Disp: , Rfl:    ondansetron (ZOFRAN) 8 MG tablet, Take 1 tablet (8 mg total) by mouth every 8 (eight) hours as needed for nausea or vomiting., Disp: 20 tablet, Rfl: 1   palbociclib (IBRANCE) 75 MG tablet, Take 1 tablet (75 mg total) by mouth daily. Take for 21 days on, 7 days off, repeat every 28 days., Disp: 21 tablet, Rfl: 1   TURMERIC CURCUMIN PO, Take 1 capsule by mouth daily., Disp: , Rfl:   Current Facility-Administered Medications:    0.9 %  sodium chloride infusion, 500 mL, Intravenous, Once, Jenel Lucks, MD  Physical exam:  Vitals:   07/04/23 1106  BP: 136/76  Pulse: (!) 59  Resp: 18  Temp: (!) 96.9 F (36.1 C)  TempSrc: Tympanic  SpO2: 100%  Weight: 132 lb 9.6 oz (60.1 kg)  Height: 5\' 1"  (1.549 m)   Physical Exam Cardiovascular:     Rate and Rhythm: Normal rate and regular rhythm.     Heart sounds: Normal heart sounds.  Pulmonary:     Effort: Pulmonary effort is normal.     Breath  sounds: Normal breath sounds.  Skin:    General: Skin is warm and dry.  Neurological:     General: No focal deficit present.     Mental Status: She is alert and oriented to person, place, and time.     Gait: Gait normal.         Latest Ref Rng & Units 07/04/2023   10:45 AM  CMP  Glucose 70 - 99 mg/dL 83   BUN 8 - 23 mg/dL 12   Creatinine 7.82 - 1.00 mg/dL 9.56   Sodium 213 - 086 mmol/L 136   Potassium 3.5 - 5.1 mmol/L 3.8   Chloride 98 - 111 mmol/L 102   CO2 22 - 32 mmol/L 23   Calcium 8.9 - 10.3 mg/dL 8.9   Total Protein 6.5 - 8.1 g/dL 7.4   Total Bilirubin 0.3 - 1.2 mg/dL 0.3   Alkaline Phos 38 - 126 U/L 49   AST 15 - 41 U/L 20   ALT 0 - 44 U/L 14       Latest Ref Rng & Units 07/04/2023   10:45 AM  CBC  WBC 4.0 - 10.5 K/uL 3.3   Hemoglobin 12.0 - 15.0 g/dL 57.8   Hematocrit 46.9 - 46.0 % 34.6   Platelets 150 - 400 K/uL 238     No images are attached to the encounter.  NM PET Image Restage (PS) Skull Base to Thigh (F-18 FDG)  Result Date: 06/15/2023 CLINICAL DATA:  Subsequent treatment  strategy for breast cancer with lung metastasis. Right upper lobe endobronchial metastasis on 01/17/2023 bronchoscopy. EXAM: NUCLEAR MEDICINE PET SKULL BASE TO THIGH TECHNIQUE: 6.9 mCi F-18 FDG was injected intravenously. Full-ring PET imaging was performed from the skull base to thigh after the radiotracer. CT data was obtained and used for attenuation correction and anatomic localization. Fasting blood glucose: 83 mg/dl COMPARISON:  60/45/4098 PET. Chest CT 12/21/2022 clinic note of 03/14/2023 FINDINGS: Mediastinal blood pool activity: SUV max 2.2 Liver activity: SUV max NA NECK: No areas of abnormal hypermetabolism. Incidental CT findings: No cervical adenopathy. CHEST: The relatively CT occult lesion at the inferior portion of the right upper lobe bronchus measures a S.U.V. max of 5.9 on image 50/4 today versus a S.U.V. max of 7.4 on the prior exam. There is persistent anteromedial right  upper lobe partial collapse. The right lower lobe pulmonary nodule is less distinct today. Possibly identified at 5 mm on 57/4 versus 7 mm on the prior. No correlate hypermetabolism. No thoracic nodal hypermetabolism. Incidental CT findings: Aortic and coronary artery calcification. Mild cardiomegaly. Mild centrilobular emphysema. ABDOMEN/PELVIS: No abdominopelvic parenchymal or nodal hypermetabolism. Incidental CT findings: Normal adrenal glands. 2.4 cm low-density lesion is likely a cyst . In the absence of clinically indicated signs/symptoms require(s) no independent follow-up. Abdominal aortic atherosclerosis. Mild pelvic floor laxity. SKELETON: No abnormal marrow activity. Incidental CT findings: Left mastectomy. IMPRESSION: 1. Mild metabolic response to therapy of a partially obstructive lesion at the inferior aspect of the right upper lobe bronchus. 2. The right lower lobe pulmonary nodule is less distinct today without correlate hypermetabolism. 3. No new metastatic disease identified. 4. Incidental findings, including: Aortic atherosclerosis (ICD10-I70.0), coronary artery atherosclerosis and emphysema (ICD10-J43.9). Electronically Signed   By: Jeronimo Greaves M.D.   On: 06/15/2023 14:23     Assessment and plan- Patient is a 67 y.o. female  with metastatic breast cancer ER/PR positive HER2 negative to the right hilum and right lower lobe lung nodule.  She is here to discuss PET CT scan results and further management  I have reviewed PET CT scan images independently and discussedFindings with the patient in detail which shows overall decrease in metabolic activity partial obstructing lesion in the right upper lobe right lower lobe pulmonary nodule.  Based on [place].  No other evidence of metastatic disease.  She will continue with letrozole and Ibrance at this time and if she has stable disease over a year and will consider holding off on giving her Ibrance at that time.  Her white count is mildly ANC  remains more than 1  Dizziness: Symptoms seem to correlate with orthostatic hypotension.  I am holding off on MRI brain at this time.  Suspicion for any CNS disease from her breast cancer is very low.  If symptoms worsen with time she will let us know and I will consider getting an MRI at that time.  I will see her back in 2 months with CBC with differential and CMP   Visit Diagnosis 1. 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 High risk medication use   3. Use of letrozole (Femara)   4. Drug-induced neutropenia (HCC)      Dr. Owens Shark, MD, MPH [facility] [phone] 07/04/2023 1:40 PM

## 2023-07-05 LAB — CANCER ANTIGEN 15-3: CA 15-3: 17.8 U/mL (ref 0.0–25.0)

## 2023-07-05 LAB — CANCER ANTIGEN 27.29: CA 27.29: 18.5 U/mL (ref 0.0–38.6)

## 2023-08-01 ENCOUNTER — Other Ambulatory Visit: Payer: Self-pay | Admitting: Pharmacist

## 2023-08-01 DIAGNOSIS — C50919 Malignant neoplasm of unspecified site of unspecified female breast: Secondary | ICD-10-CM

## 2023-08-01 MED ORDER — PALBOCICLIB 75 MG PO TABS: 75.0000 mg | ORAL_TABLET | Freq: Every day | ORAL | 6 refills | Status: DC

## 2023-08-02 NOTE — Progress Notes (Unsigned)
Subjective:   MIYONA HYMAN is a 67 y.o. female who presents for Medicare Annual (Subsequent) preventive examination.  Visit Complete: {VISITMETHOD:[phone]}  Patient Medicare AWV questionnaire was completed by the patient on ***; I have confirmed that all information answered by patient is correct and no changes since this date.  Review of Systems    ***       Objective:    There were no vitals filed for this visit. There is no height or weight on file to calculate BMI.     07/04/2023   11:14 AM 03/14/2023   11:04 AM 02/13/2023    9:25 AM 01/27/2023    2:09 PM 01/17/2023    9:40 AM 05/10/2022    8:56 AM 08/11/2017   11:28 AM  Advanced Directives  Does Patient Have a Medical Advance Directive? No No No No No No Yes  Type of Furniture conservator/restorer;Living will       Would patient like information on creating a medical advance directive? No - Patient declined  No - Patient declined No - Patient declined No - Patient declined      Current Medications (verified) Outpatient Encounter Medications as of 08/03/2023  Medication Sig   albuterol (VENTOLIN HFA) 108 (90 Base) MCG/ACT inhaler Inhale into the lungs.   APPLE CIDER VINEGAR PO Take 1 capsule by mouth daily.   aspirin EC 81 MG tablet Take 1 tablet (81 mg total) by mouth daily. Swallow whole.   atorvastatin (LIPITOR) 40 MG tablet Take 1 tablet (40 mg total) by mouth daily.   CINNAMON PO Take 1 capsule by mouth daily.   Coenzyme Q10 (COQ10 PO) Take 400 mg by mouth daily.   diazepam (VALIUM) 10 MG tablet TAKE ONE TABLET BY MOUTH EVERY 12 HOURS AS NEEDED FOR ANXIETY   escitalopram (LEXAPRO) 10 MG tablet Take 1 tablet (10 mg total) by mouth daily.   FIBER ADULT GUMMIES PO Take 2 tablets by mouth daily. Vitafusion Fiber Well Gummies   letrozole (FEMARA) 2.5 MG tablet TAKE 1 TABLET BY MOUTH DAILY   Multiple Vitamins-Minerals (AIRBORNE PO) Take 1 tablet by mouth daily as needed (immune support).    ondansetron (ZOFRAN) 8 MG tablet Take 1 tablet (8 mg total) by mouth every 8 (eight) hours as needed for nausea or vomiting.   palbociclib (IBRANCE) 75 MG tablet Take 1 tablet (75 mg total) by mouth daily. Take for 21 days on, 7 days off, repeat every 28 days.   TURMERIC CURCUMIN PO Take 1 capsule by mouth daily.   Facility-Administered Encounter Medications as of 08/03/2023  Medication   0.9 %  sodium chloride infusion    Allergies (verified) Chantix [varenicline], Codeine, and Wellbutrin [bupropion]   History: Past Medical History:  Diagnosis Date   Abnormal EKG    Anxiety    Phreesia 11/29/2020   Arthritis    Phreesia 11/29/2020   Barrett's esophagus    Breast cancer (HCC) 2018   Left breast   Cancer (HCC)    Phreesia 11/29/2020   Complication of anesthesia    Family history of breast cancer    Family history of prostate cancer    Family history of thyroid cancer    Fatigue    Generalized headaches    headaches are less since using a mouth piece to prevent grinding of teeth   Hepatitis    as a teenager   IBS (irritable bowel syndrome)    Meniere disease    Osteopenia  Pneumonia    PONV (postoperative nausea and vomiting)    Psoriasis    Tinnitus    left ear   Past Surgical History:  Procedure Laterality Date   BREAST SURGERY N/A    Phreesia 11/29/2020   BRONCHIAL BIOPSY  01/17/2023   Procedure: BRONCHIAL BIOPSIES;  Surgeon: Josephine Igo, DO;  Location: [facility];  Service: Pulmonary;;   BRONCHIAL BRUSHINGS  01/17/2023   Procedure: BRONCHIAL BRUSHINGS;  Surgeon: Josephine Igo, DO;  Location: [facility];  Service: Pulmonary;;   DILATION AND CURETTAGE OF UTERUS     FINE NEEDLE ASPIRATION  05/10/2022   Procedure: FINE NEEDLE ASPIRATION (FNA) LINEAR;  Surgeon: Josephine Igo, DO;  Location: [facility];  Service: Pulmonary;;   MASTECTOMY Left 2018   MASTECTOMY W/ SENTINEL NODE BIOPSY Left 08/03/2017   Procedure: LEFT MASTECTOMY WITH LEFT AXILLARY  SENTINEL LYMPH NODE BIOPSY;  Surgeon: Ovidio Kin, MD;  Location: [facility];  Service: General;  Laterality: Left;   skin cancer removal     TONSILLECTOMY     VIDEO BRONCHOSCOPY WITH ENDOBRONCHIAL ULTRASOUND N/A 05/10/2022   Procedure: VIDEO BRONCHOSCOPY WITH ENDOBRONCHIAL ULTRASOUND;  Surgeon: Josephine Igo, DO;  Location: [facility];  Service: Pulmonary;  Laterality: N/A;   VIDEO BRONCHOSCOPY WITH ENDOBRONCHIAL ULTRASOUND Right 01/17/2023   Procedure: VIDEO BRONCHOSCOPY WITH ENDOBRONCHIAL ULTRASOUND;  Surgeon: Josephine Igo, DO;  Location: [facility];  Service: Pulmonary;  Laterality: Right;   Family History  Problem Relation Age of Onset   Hypertension Mother    Heart attack Father    Hypertension Father    Healthy Maternal Aunt    Healthy Maternal Uncle    Breast cancer Paternal Aunt 30       bilateral   Healthy Paternal Aunt    Prostate cancer Paternal Uncle    Thyroid cancer Paternal Uncle    Healthy Paternal Uncle    Breast cancer Cousin 32       paternal first cousin   Breast cancer Cousin        paternal first cousin   Colon cancer Neg Hx    Esophageal cancer Neg Hx    Rectal cancer Neg Hx    Stomach cancer Neg Hx    Social History   Socioeconomic History   Marital status: Married    Spouse name: Not on file   Number of children: Not on file   Years of education: Not on file   Highest education level: Associate degree: occupational, Scientist, product/process development, or vocational program  Occupational History   Not on file  Tobacco Use   Smoking status: Former    Current packs/day: 0.00    Types: Cigarettes    Quit date: 2018    Years since quitting: 6.6    Passive exposure: Past   Smokeless tobacco: Never  Vaping Use   Vaping status: Never Used  Substance and Sexual Activity   Alcohol use: Yes    Comment: occas   Drug use: Never   Sexual activity: Not Currently    Birth control/protection: Post-menopausal  Other Topics Concern   Not on file   Social History Narrative   Not on file   Social Determinants of Health   Financial Resource Strain: Low Risk  (05/02/2023)   Overall Financial Resource Strain (CARDIA)    Difficulty of Paying Living Expenses: Not very hard  Food Insecurity: No Food Insecurity (05/02/2023)   Hunger Vital Sign    Worried About Running Out of Food in the  Last Year: Never true    Ran Out of Food in the Last Year: Never true  Transportation Needs: No Transportation Needs (05/02/2023)   PRAPARE - Administrator, Civil Service (Medical): No    Lack of Transportation (Non-Medical): No  Physical Activity: Sufficiently Active (05/02/2023)   Exercise Vital Sign    Days of Exercise per Week: 3 days    Minutes of Exercise per Session: 60 min  Stress: Stress Concern Present (05/02/2023)   [employer] of Occupational Health - Occupational Stress Questionnaire    Feeling of Stress : Very much  Social Connections: Moderately Integrated (05/02/2023)   Social Connection and Isolation Panel [NHANES]    Frequency of Communication with Friends and Family: More than three times a week    Frequency of Social Gatherings with Friends and Family: Twice a week    Attends Religious Services: More than 4 times per year    Active Member of [employer] or Organizations: No    Attends Engineer, structural: Not on file    Marital Status: Married    Tobacco Counseling Counseling given: Not Answered   Clinical Intake:              How often do you need to have someone help you when you read instructions, pamphlets, or other written materials from your doctor or pharmacy?: (P) 1 - Never         Activities of Daily Living    08/02/2023   11:00 PM  In your present state of health, do you have any difficulty performing the following activities:  Hearing? 1  Vision? 0  Difficulty concentrating or making decisions? 0  Walking or climbing stairs? 0  Dressing or bathing? 0  Doing errands, shopping?  0  Preparing Food and eating ? N  Using the Toilet? N  In the past six months, have you accidently leaked urine? Y  Do you have problems with loss of bowel control? N  Managing your Medications? N  Managing your Finances? N  Housekeeping or managing your Housekeeping? N    Patient Care Team: Donita Brooks, MD as PCP - General (Family Medicine) Jodelle Red, MD as PCP - Cardiology (Cardiology) Myna Hidalgo, DO as Consulting Physician (Obstetrics and Gynecology) Ovidio Kin, MD as Consulting Physician (General Surgery) Dorothy Puffer, MD as Consulting Physician (Radiation Oncology) Creig Hines, MD as Consulting Physician (Oncology)  Indicate any recent Medical Services you may have received from other than [facility] providers in the past year (date may be approximate).     Assessment:   This is a routine wellness examination for Ivan.  Hearing/Vision screen No results found.  Dietary issues and exercise activities discussed:     Goals Addressed   None    Depression Screen    01/27/2023    2:21 PM 12/02/2022    8:30 AM 10/07/2022   11:15 AM 08/30/2022    8:12 AM 01/11/2022   10:29 AM 12/02/2021    8:27 AM 12/01/2020    9:20 AM  PHQ 2/9 Scores  PHQ - 2 Score 2 2 1 1 2  0 0  PHQ- 9 Score  6   4 0 0    Fall Risk    08/02/2023   11:00 PM 12/02/2022    8:30 AM 10/07/2022   11:15 AM 08/30/2022    8:12 AM 01/11/2022   10:22 AM  Fall Risk   Falls in the past year? 0 0 0 0 1  Number falls in past yr:  0 0 0 0  Injury with Fall?  0 0 0 1  Risk for fall due to :  No Fall Risks No Fall Risks No Fall Risks   Follow up  Falls prevention discussed Falls prevention discussed Falls prevention discussed     MEDICARE RISK AT HOME:   TIMED UP AND GO:  Was the test performed?  {AMBTIMEDUPGO:[phone]}    Cognitive Function:        Immunizations Immunization History  Administered Date(s) Administered   Influenza-Unspecified 10/15/2010, 10/19/2018   Tdap  10/14/2010    {TDAP status:2101805}  {Flu Vaccine status:2101806}  {Pneumococcal vaccine status:2101807}  {Covid-19 vaccine status:2101808}  Qualifies for Shingles Vaccine? {YES/NO:21197}  Zostavax completed {YES/NO:21197}  {Shingrix Completed?:2101804}  Screening Tests Health Maintenance  Topic Date Due   Pneumonia Vaccine 67+ Years old (1 of 1 - PCV) 12/03/2023 (Originally 12/05/2021)   Medicare Annual Wellness (AWV)  12/03/2023   MAMMOGRAM  02/22/2025   Colonoscopy  09/15/2031   DEXA SCAN  Completed   Hepatitis C Screening  Completed   HPV VACCINES  Aged Out   DTaP/Tdap/Td  Discontinued   INFLUENZA VACCINE  Discontinued   COVID-19 Vaccine  Discontinued   Zoster Vaccines- Shingrix  Discontinued    Health Maintenance  There are no preventive care reminders to display for this patient.  Colorectal cancer screening: Type of screening: Colonoscopy. Completed 09/14/21. Repeat every 110 years  Mammogram status: Completed 02/23/23. Repeat every year  Bone Density status: Completed 02/23/23. Results reflect: Bone density results: OSTEOPENIA. Repeat every 2 years.  Lung Cancer Screening: (Low Dose CT Chest recommended if Age 34-80 years, 20 pack-year currently smoking OR have quit w/in 15years.) does not qualify.   Lung Cancer Screening Referral: n/a  Additional Screening:  Hepatitis C Screening: does qualify; Completed 11/23/18  Vision Screening: Recommended annual ophthalmology exams for early detection of glaucoma and other disorders of the eye. Is the patient up to date with their annual eye exam?  {YES/NO:21197} Who is the provider or what is the name of the office in which the patient attends annual eye exams? *** If pt is not established with a provider, would they like to be referred to a provider to establish care? {YES/NO:21197}.   Dental Screening: Recommended annual dental exams for proper oral hygiene  Community Resource Referral / Chronic Care Management: CRR  required this visit?  {YES/NO:21197}  CCM required this visit?  {CCM Required choices:[phone]}     Plan:     I have personally reviewed and noted the following in the patient's chart:   Medical and social history Use of alcohol, tobacco or illicit drugs  Current medications and supplements including opioid prescriptions. {Opioid Prescriptions:[phone]} Functional ability and status Nutritional status Physical activity Advanced directives List of other physicians Hospitalizations, surgeries, and ER visits in previous 12 months Vitals Screenings to include cognitive, depression, and falls Referrals and appointments  In addition, I have reviewed and discussed with patient certain preventive protocols, quality metrics, and best practice recommendations. A written personalized care plan for preventive services as well as general preventive health recommendations were provided to patient.     Kandis Fantasia [place], [place]   7/82/9562   After Visit Summary: {CHL AMB AWV After Visit Summary:[phone]}  Nurse Notes: ***

## 2023-08-03 ENCOUNTER — Ambulatory Visit (INDEPENDENT_AMBULATORY_CARE_PROVIDER_SITE_OTHER): Payer: Medicare Other

## 2023-08-03 VITALS — BP 122/64 | Ht 61.0 in | Wt 129.8 lb

## 2023-08-03 DIAGNOSIS — Z0000 Encounter for general adult medical examination without abnormal findings: Secondary | ICD-10-CM

## 2023-08-03 NOTE — Patient Instructions (Addendum)
Monique [name] , Thank you for taking time to come for your Medicare Wellness Visit. I appreciate your ongoing commitment to your health goals. Please review the following plan we discussed and let me know if I can assist you in the future.   Referrals/Orders/Follow-Ups/Clinician Recommendations: Aim for 30 minutes of exercise or brisk walking, 6-8 glasses of water, and 5 servings of fruits and vegetables each day.  This is a list of the screening recommended for you and due dates:  Health Maintenance  Topic Date Due   Pneumonia Vaccine (1 of 1 - PCV) 12/03/2023*   Medicare Annual Wellness Visit  12/03/2023   Mammogram  02/22/2025   Colon Cancer Screening  09/15/2031   DEXA scan (bone density measurement)  Completed   Hepatitis C Screening  Completed   HPV Vaccine  Aged Out   DTaP/Tdap/Td vaccine  Discontinued   Flu Shot  Discontinued   COVID-19 Vaccine  Discontinued   Zoster (Shingles) Vaccine  Discontinued  *Topic was postponed. The date shown is not the original due date.    Advanced directives: (In Chart) A copy of your advanced directives are scanned into your chart should your provider ever need it.  Next Medicare Annual Wellness Visit scheduled for next year: Yes  Preventive Care 67 Years and Older, Female Preventive care refers to lifestyle choices and visits with your health care provider that can promote health and wellness. What does preventive care include? A yearly physical exam. This is also called an annual well check. Dental exams once or twice a year. Routine eye exams. Ask your health care provider how often you should have your eyes checked. Personal lifestyle choices, including: Daily care of your teeth and gums. Regular physical activity. Eating a healthy diet. Avoiding tobacco and drug use. Limiting alcohol use. Practicing safe sex. Taking low-dose aspirin every day. Taking vitamin and mineral supplements as recommended by your health care provider. What  happens during an annual well check? The services and screenings done by your health care provider during your annual well check will depend on your age, overall health, lifestyle risk factors, and family history of disease. Counseling  Your health care provider may ask you questions about your: Alcohol use. Tobacco use. Drug use. Emotional well-being. Home and relationship well-being. Sexual activity. Eating habits. History of falls. Memory and ability to understand (cognition). Work and work Astronomer. Reproductive health. Screening  You may have the following tests or measurements: Height, weight, and BMI. Blood pressure. Lipid and cholesterol levels. These may be checked every 5 years, or more frequently if you are over 6 years old. Skin check. Lung cancer screening. You may have this screening every year starting at age 67 if you have a 30-pack-year history of smoking and currently smoke or have quit within the past 15 years. Fecal occult blood test (FOBT) of the stool. You may have this test every year starting at age 67. Flexible sigmoidoscopy or colonoscopy. You may have a sigmoidoscopy every 5 years or a colonoscopy every 10 years starting at age 57. Hepatitis C blood test. Hepatitis B blood test. Sexually transmitted disease (STD) testing. Diabetes screening. This is done by checking your blood sugar (glucose) after you have not eaten for a while (fasting). You may have this done every 1-3 years. Bone density scan. This is done to screen for osteoporosis. You may have this done starting at age 67. Mammogram. This may be done every 1-2 years. Talk to your health care provider about how often  you should have regular mammograms. Talk with your health care provider about your test results, treatment options, and if necessary, the need for more tests. Vaccines  Your health care provider may recommend certain vaccines, such as: Influenza vaccine. This is recommended every  year. Tetanus, diphtheria, and acellular pertussis (Tdap, Td) vaccine. You may need a Td booster every 10 years. Zoster vaccine. You may need this after age 67. Pneumococcal 13-valent conjugate (PCV13) vaccine. One dose is recommended after age 67. Pneumococcal polysaccharide (PPSV23) vaccine. One dose is recommended after age 67. Talk to your health care provider about which screenings and vaccines you need and how often you need them. This information is not intended to replace advice given to you by your health care provider. Make sure you discuss any questions you have with your health care provider. Document Released: 01/01/2016 Document Revised: 08/24/2016 Document Reviewed: 10/06/2015 Elsevier Interactive Patient Education  2017 [employer].  Fall Prevention in the Home Falls can cause injuries. They can happen to people of all ages. There are many things you can do to make your home safe and to help prevent falls. What can I do on the outside of my home? Regularly fix the edges of walkways and driveways and fix any cracks. Remove anything that might make you trip as you walk through a door, such as a raised step or threshold. Trim any bushes or trees on the path to your home. Use bright outdoor lighting. Clear any walking paths of anything that might make someone trip, such as rocks or tools. Regularly check to see if handrails are loose or broken. Make sure that both sides of any steps have handrails. Any raised decks and porches should have guardrails on the edges. Have any leaves, snow, or ice cleared regularly. Use sand or salt on walking paths during winter. Clean up any spills in your garage right away. This includes oil or grease spills. What can I do in the bathroom? Use night lights. Install grab bars by the toilet and in the tub and shower. Do not use towel bars as grab bars. Use non-skid mats or decals in the tub or shower. If you need to sit down in the shower, use a  plastic, non-slip stool. Keep the floor dry. Clean up any water that spills on the floor as soon as it happens. Remove soap buildup in the tub or shower regularly. Attach bath mats securely with double-sided non-slip rug tape. Do not have throw rugs and other things on the floor that can make you trip. What can I do in the bedroom? Use night lights. Make sure that you have a light by your bed that is easy to reach. Do not use any sheets or blankets that are too big for your bed. They should not hang down onto the floor. Have a firm chair that has side arms. You can use this for support while you get dressed. Do not have throw rugs and other things on the floor that can make you trip. What can I do in the kitchen? Clean up any spills right away. Avoid walking on wet floors. Keep items that you use a lot in easy-to-reach places. If you need to reach something above you, use a strong step stool that has a grab bar. Keep electrical cords out of the way. Do not use floor polish or wax that makes floors slippery. If you must use wax, use non-skid floor wax. Do not have throw rugs and other things on  the floor that can make you trip. What can I do with my stairs? Do not leave any items on the stairs. Make sure that there are handrails on both sides of the stairs and use them. Fix handrails that are broken or loose. Make sure that handrails are as long as the stairways. Check any carpeting to make sure that it is firmly attached to the stairs. Fix any carpet that is loose or worn. Avoid having throw rugs at the top or bottom of the stairs. If you do have throw rugs, attach them to the floor with carpet tape. Make sure that you have a light switch at the top of the stairs and the bottom of the stairs. If you do not have them, ask someone to add them for you. What else can I do to help prevent falls? Wear shoes that: Do not have high heels. Have rubber bottoms. Are comfortable and fit you  well. Are closed at the toe. Do not wear sandals. If you use a stepladder: Make sure that it is fully opened. Do not climb a closed stepladder. Make sure that both sides of the stepladder are locked into place. Ask someone to hold it for you, if possible. Clearly mark and make sure that you can see: Any grab bars or handrails. First and last steps. Where the edge of each step is. Use tools that help you move around (mobility aids) if they are needed. These include: Canes. Walkers. Scooters. Crutches. Turn on the lights when you go into a dark area. Replace any light bulbs as soon as they burn out. Set up your furniture so you have a clear path. Avoid moving your furniture around. If any of your floors are uneven, fix them. If there are any pets around you, be aware of where they are. Review your medicines with your doctor. Some medicines can make you feel dizzy. This can increase your chance of falling. Ask your doctor what other things that you can do to help prevent falls. This information is not intended to replace advice given to you by your health care provider. Make sure you discuss any questions you have with your health care provider. Document Released: 10/01/2009 Document Revised: 05/12/2016 Document Reviewed: 01/09/2015 Elsevier Interactive Patient Education  2017 [employer].

## 2023-08-22 ENCOUNTER — Other Ambulatory Visit: Payer: Self-pay | Admitting: Family Medicine

## 2023-08-24 NOTE — Telephone Encounter (Signed)
Requested Prescriptions  Pending Prescriptions Disp Refills   escitalopram (LEXAPRO) 10 MG tablet [Pharmacy Med Name: ESCITALOPRAM 10 MG TABLET] 30 tablet 0    Sig: TAKE 1 TABLET BY MOUTH DAILY     Psychiatry:  Antidepressants - SSRI Failed - 08/22/2023  8:59 PM      Failed - Valid encounter within last 6 months    Recent Outpatient Visits           1 year ago Lung mass   [facility] Pickard, Priscille Heidelberg, MD   1 year ago General medical exam   [facility] Donita Brooks, MD   2 years ago Dysuria   [facility] Donita Brooks, MD   2 years ago General medical exam   [facility] Donita Brooks, MD   2 years ago Chest pain at rest   [facility] Pickard, Priscille Heidelberg, MD       Future Appointments             In 5 months Pickard, Priscille Heidelberg, MD [facility], [facility]             Courtesy refill. Patient will need a follow up appointment for further refills.

## 2023-09-05 ENCOUNTER — Inpatient Hospital Stay: Payer: Medicare Other | Attending: Hospice and Palliative Medicine

## 2023-09-05 ENCOUNTER — Inpatient Hospital Stay (HOSPITAL_BASED_OUTPATIENT_CLINIC_OR_DEPARTMENT_OTHER): Payer: Medicare Other | Admitting: Oncology

## 2023-09-05 ENCOUNTER — Encounter: Payer: Self-pay | Admitting: Oncology

## 2023-09-05 VITALS — BP 84/74 | HR 64 | Temp 95.5°F | Resp 18 | Wt 133.8 lb

## 2023-09-05 DIAGNOSIS — Z170 Estrogen receptor positive status [ER+]: Secondary | ICD-10-CM | POA: Diagnosis not present

## 2023-09-05 DIAGNOSIS — Z79811 Long term (current) use of aromatase inhibitors: Secondary | ICD-10-CM | POA: Diagnosis not present

## 2023-09-05 DIAGNOSIS — C50919 Malignant neoplasm of unspecified site of unspecified female breast: Secondary | ICD-10-CM | POA: Insufficient documentation

## 2023-09-05 DIAGNOSIS — C7800 Secondary malignant neoplasm of unspecified lung: Secondary | ICD-10-CM | POA: Insufficient documentation

## 2023-09-05 DIAGNOSIS — Z87891 Personal history of nicotine dependence: Secondary | ICD-10-CM | POA: Diagnosis not present

## 2023-09-05 DIAGNOSIS — Z79899 Other long term (current) drug therapy: Secondary | ICD-10-CM | POA: Diagnosis not present

## 2023-09-05 LAB — CBC WITH DIFFERENTIAL/PLATELET
Abs Immature Granulocytes: 0.01 10*3/uL (ref 0.00–0.07)
Basophils Absolute: 0.1 10*3/uL (ref 0.0–0.1)
Basophils Relative: 2 %
Eosinophils Absolute: 0.1 10*3/uL (ref 0.0–0.5)
Eosinophils Relative: 4 %
HCT: 37 % (ref 36.0–46.0)
Hemoglobin: 12.1 g/dL (ref 12.0–15.0)
Immature Granulocytes: 0 %
Lymphocytes Relative: 49 %
Lymphs Abs: 1.8 10*3/uL (ref 0.7–4.0)
MCH: 32.2 pg (ref 26.0–34.0)
MCHC: 32.7 g/dL (ref 30.0–36.0)
MCV: 98.4 fL (ref 80.0–100.0)
Monocytes Absolute: 0.3 10*3/uL (ref 0.1–1.0)
Monocytes Relative: 8 %
Neutro Abs: 1.4 10*3/uL — ABNORMAL LOW (ref 1.7–7.7)
Neutrophils Relative %: 37 %
Platelets: 234 10*3/uL (ref 150–400)
RBC: 3.76 MIL/uL — ABNORMAL LOW (ref 3.87–5.11)
RDW: 14.3 % (ref 11.5–15.5)
WBC: 3.7 10*3/uL — ABNORMAL LOW (ref 4.0–10.5)
nRBC: 0 % (ref 0.0–0.2)

## 2023-09-05 LAB — COMPREHENSIVE METABOLIC PANEL
ALT: 14 U/L (ref 0–44)
AST: 18 U/L (ref 15–41)
Albumin: 4.4 g/dL (ref 3.5–5.0)
Alkaline Phosphatase: 52 U/L (ref 38–126)
Anion gap: 8 (ref 5–15)
BUN: 11 mg/dL (ref 8–23)
CO2: 26 mmol/L (ref 22–32)
Calcium: 8.8 mg/dL — ABNORMAL LOW (ref 8.9–10.3)
Chloride: 98 mmol/L (ref 98–111)
Creatinine, Ser: 0.8 mg/dL (ref 0.44–1.00)
GFR, Estimated: >60 mL/min (ref >60)
Glucose, Bld: 94 mg/dL (ref 70–99)
Potassium: 4.2 mmol/L (ref 3.5–5.1)
Sodium: 132 mmol/L — ABNORMAL LOW (ref 135–145)
Total Bilirubin: 0.4 mg/dL (ref 0.3–1.2)
Total Protein: 7.6 g/dL (ref 6.5–8.1)

## 2023-09-05 NOTE — Progress Notes (Signed)
Hematology/Oncology Consult note [facility]  [phone] [phone]  Patient Care Team: Donita Brooks, MD as PCP - General (Family Medicine) Jodelle Red, MD as PCP - Cardiology (Cardiology) Myna Hidalgo, DO as Consulting Physician (Obstetrics and Gynecology) Ovidio Kin, MD as Consulting Physician (General Surgery) Dorothy Puffer, MD as Consulting Physician (Radiation Oncology) Creig Hines, MD as Consulting Physician (Oncology)   Name of the patient: Monique [name]  [id number]  11/30/1956   Date of visit: 09/05/23  Diagnosis- 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  This was ER 100% positive, PR 95% positive HER2 negative Ki-67 15%.  She underwent left mastectomy and did not require any adjuvant radiation therapy.  Oncotype score came back low at 5 and therefore did not require any adjuvant chemotherapy.  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  Patient was initially started on Arimidex which she could not tolerate due to dizziness and was switched to tamoxifen which she could also not tolerate and subsequently stopped taking endocrine therapy shortly.  She was seen by both Dr. Pamelia Hoit and Dr. Mosetta Putt at [facility] back in 2018 and was subsequently lost to follow-up   Patient has been seeing Dr. Tonia Brooms this for evaluation of bilateral lung nodules and abnormal lung cancer screening CT noted in March 2023.  At that time she had a hypermetabolism with an SUV of 6 in the right hilum.  She had a bronchoscopy done and biopsies of the left bronchoscopy were negative as well as that of the hilum was negative.  There was appearance of a right  lower lobe lung nodule which was being followed given that it was spiculated.  There was a plan for repeat bronchoscopy in November 2023 but Dr. Tonia Brooms was sick at that time and patient did not wish to get bronchoscopy by any other pulmonary.  Bronchoscopy was therefore delayed and patient had PET CT scan in January 2024.  PET scan showed intense hypermetabolic activity again in the right hilum with no clear lesion.  Findings concerning for neoplasm obstructing the bronchus with the right middle lobe atelectasis.  Low level of metabolic activity in the right lower lobe.  No other evidence of distant metastatic disease.  Prior to that in October 2023 patient was noted to have bilateral lung nodules varying from 4 to 8 mm in size which could be potentially metastatic.   Had repeat bronchoscopy on 01/17/2023.  Right upper lobe endobronchial lesion brushing was positive for metastatic breast carcinoma.  Tumor cells positive for GATA3 and ER.  Negative for CK5/6, p40, TTF-1, chromogranin, Napsin synaptophysin and CD56.  Patient is consistent with metastatic carcinoma of breast origin. Tumor was 90% ER positive, 85% PR positive and HER2 negative. Patient started on letrozole in February 2024.  Ibrance started in March 2024.  Interval history-blood pressure has been running low over the last 1 or 2 weeks.  She feels tired.  She has noticed more hair loss  ECOG PS- 1 Pain scale- 0   Review of systems- Review of Systems  Constitutional:  Positive for malaise/fatigue. Negative for chills, fever and weight loss.  HENT:  Negative for congestion, ear discharge and nosebleeds.   Eyes:  Negative for  blurred vision.  Respiratory:  Negative for cough, hemoptysis, sputum production, shortness of breath and wheezing.   Cardiovascular:  Negative for chest pain, palpitations, orthopnea and claudication.  Gastrointestinal:  Negative for abdominal pain, blood in stool, constipation, diarrhea, heartburn, melena, nausea and  vomiting.  Genitourinary:  Negative for dysuria, flank pain, frequency, hematuria and urgency.  Musculoskeletal:  Negative for back pain, joint pain and myalgias.  Skin:  Negative for rash.  Neurological:  Negative for dizziness, tingling, focal weakness, seizures, weakness and headaches.  Endo/Heme/Allergies:  Does not bruise/bleed easily.  Psychiatric/Behavioral:  Negative for depression and suicidal ideas. The patient does not have insomnia.       Allergies  Allergen Reactions   Chantix [Varenicline]     Hallucination   Codeine Nausea And Vomiting   Wellbutrin [Bupropion] Other (See Comments)    Hallucinations       Past Medical History:  Diagnosis Date   Abnormal EKG    Anxiety    Phreesia 11/29/2020   Arthritis    Phreesia 11/29/2020   Barrett's esophagus    Breast cancer (HCC) 2018   Left breast   Cancer (HCC)    Phreesia 11/29/2020   Complication of anesthesia    Family history of breast cancer    Family history of prostate cancer    Family history of thyroid cancer    Fatigue    Generalized headaches    headaches are less since using a mouth piece to prevent grinding of teeth   Hepatitis    as a teenager   IBS (irritable bowel syndrome)    Meniere disease    Osteopenia    Pneumonia    PONV (postoperative nausea and vomiting)    Psoriasis    Tinnitus    left ear     Past Surgical History:  Procedure Laterality Date   BREAST SURGERY N/A    Phreesia 11/29/2020   BRONCHIAL BIOPSY  01/17/2023   Procedure: BRONCHIAL BIOPSIES;  Surgeon: Josephine Igo, DO;  Location: [facility];  Service: Pulmonary;;   BRONCHIAL BRUSHINGS  01/17/2023   Procedure: BRONCHIAL BRUSHINGS;  Surgeon: Josephine Igo, DO;  Location: [facility];  Service: Pulmonary;;   DILATION AND CURETTAGE OF UTERUS     FINE NEEDLE ASPIRATION  05/10/2022   Procedure: FINE NEEDLE ASPIRATION (FNA) LINEAR;  Surgeon: Josephine Igo, DO;  Location: [facility];  Service: Pulmonary;;    MASTECTOMY Left 2018   MASTECTOMY W/ SENTINEL NODE BIOPSY Left 08/03/2017   Procedure: LEFT MASTECTOMY WITH LEFT AXILLARY SENTINEL LYMPH NODE BIOPSY;  Surgeon: Ovidio Kin, MD;  Location:  SURGERY CENTER;  Service: General;  Laterality: Left;   skin cancer removal     TONSILLECTOMY     VIDEO BRONCHOSCOPY WITH ENDOBRONCHIAL ULTRASOUND N/A 05/10/2022   Procedure: VIDEO BRONCHOSCOPY WITH ENDOBRONCHIAL ULTRASOUND;  Surgeon: Josephine Igo, DO;  Location: [facility];  Service: Pulmonary;  Laterality: N/A;   VIDEO BRONCHOSCOPY WITH ENDOBRONCHIAL ULTRASOUND Right 01/17/2023   Procedure: VIDEO BRONCHOSCOPY WITH ENDOBRONCHIAL ULTRASOUND;  Surgeon: Josephine Igo, DO;  Location: [facility];  Service: Pulmonary;  Laterality: Right;    Social History   Socioeconomic History   Marital status: Married    Spouse name: Not on file   Number of children: Not on file   Years of education: Not on file   Highest education level: Associate degree: occupational, Scientist, product/process development, or vocational program  Occupational History   Not on file  Tobacco Use   Smoking status: Former  Current packs/day: 0.00    Types: Cigarettes    Quit date: 2018    Years since quitting: 6.7    Passive exposure: Past   Smokeless tobacco: Never  Vaping Use   Vaping status: Never Used  Substance and Sexual Activity   Alcohol use: Yes    Comment: occas   Drug use: Never   Sexual activity: Not Currently    Birth control/protection: Post-menopausal  Other Topics Concern   Not on file  Social History Narrative   Not on file   Social Determinants of Health   Financial Resource Strain: Low Risk  (08/02/2023)   Overall Financial Resource Strain (CARDIA)    Difficulty of Paying Living Expenses: Not very hard  Food Insecurity: No Food Insecurity (08/02/2023)   Hunger Vital Sign    Worried About Running Out of Food in the Last Year: Never true    Ran Out of Food in the Last Year: Never true  Transportation Needs: No  Transportation Needs (08/02/2023)   PRAPARE - Administrator, Civil Service (Medical): No    Lack of Transportation (Non-Medical): No  Physical Activity: Sufficiently Active (08/02/2023)   Exercise Vital Sign    Days of Exercise per Week: 4 days    Minutes of Exercise per Session: 40 min  Stress: Stress Concern Present (08/02/2023)   [employer] of Occupational Health - Occupational Stress Questionnaire    Feeling of Stress : To some extent  Social Connections: Moderately Integrated (08/02/2023)   Social Connection and Isolation Panel [NHANES]    Frequency of Communication with Friends and Family: Twice a week    Frequency of Social Gatherings with Friends and Family: Once a week    Attends Religious Services: More than 4 times per year    Active Member of [employer] or Organizations: No    Attends Banker Meetings: Never    Marital Status: Married  Catering manager Violence: Not At Risk (08/03/2023)   Humiliation, Afraid, Rape, and Kick questionnaire    Fear of Current or Ex-Partner: No    Emotionally Abused: No    Physically Abused: No    Sexually Abused: No    Family History  Problem Relation Age of Onset   Hypertension Mother    Heart attack Father    Hypertension Father    Healthy Maternal Aunt    Healthy Maternal Uncle    Breast cancer Paternal Aunt 35       bilateral   Healthy Paternal Aunt    Prostate cancer Paternal Uncle    Thyroid cancer Paternal Uncle    Healthy Paternal Uncle    Breast cancer Cousin 60       paternal first cousin   Breast cancer Cousin        paternal first cousin   Colon cancer Neg Hx    Esophageal cancer Neg Hx    Rectal cancer Neg Hx    Stomach cancer Neg Hx      Current Outpatient Medications:    diazepam (VALIUM) 10 MG tablet, TAKE ONE TABLET BY MOUTH EVERY 12 HOURS AS NEEDED FOR ANXIETY, Disp: 30 tablet, Rfl: 3   escitalopram (LEXAPRO) 10 MG tablet, TAKE 1 TABLET BY MOUTH DAILY, Disp: 30 tablet, Rfl: 0    letrozole (FEMARA) 2.5 MG tablet, TAKE 1 TABLET BY MOUTH DAILY, Disp: 90 tablet, Rfl: 0   Multiple Vitamins-Minerals (AIRBORNE PO), Take 1 tablet by mouth daily as needed (immune support)., Disp: , Rfl:  palbociclib (IBRANCE) 75 MG tablet, Take 1 tablet (75 mg total) by mouth daily. Take for 21 days on, 7 days off, repeat every 28 days., Disp: 21 tablet, Rfl: 6   atorvastatin (LIPITOR) 40 MG tablet, Take 1 tablet (40 mg total) by mouth daily. (Patient not taking: Reported on 08/03/2023), Disp: 90 tablet, Rfl: 3   ondansetron (ZOFRAN) 8 MG tablet, Take 1 tablet (8 mg total) by mouth every 8 (eight) hours as needed for nausea or vomiting. (Patient not taking: Reported on 08/03/2023), Disp: 20 tablet, Rfl: 1  Current Facility-Administered Medications:    0.9 %  sodium chloride infusion, 500 mL, Intravenous, Once, Jenel Lucks, MD  Physical exam:  Vitals:   09/05/23 1120  BP: (!) 84/74  Pulse: 64  Resp: 18  Temp: (!) 95.5 F (35.3 C)  TempSrc: Tympanic  SpO2: 100%  Weight: 133 lb 12.8 oz (60.7 kg)   Physical Exam Cardiovascular:     Rate and Rhythm: Normal rate and regular rhythm.     Heart sounds: Normal heart sounds.  Pulmonary:     Effort: Pulmonary effort is normal.     Breath sounds: Normal breath sounds.  Abdominal:     General: Bowel sounds are normal.     Palpations: Abdomen is soft.  Musculoskeletal:     Right lower leg: No edema.     Left lower leg: No edema.  Skin:    General: Skin is warm and dry.  Neurological:     Mental Status: She is alert and oriented to person, place, and time.         Latest Ref Rng & Units 09/05/2023   10:48 AM  CMP  Glucose 70 - 99 mg/dL 94   BUN 8 - 23 mg/dL 11   Creatinine 2.13 - 1.00 mg/dL 0.86   Sodium 578 - 469 mmol/L 132   Potassium 3.5 - 5.1 mmol/L 4.2   Chloride 98 - 111 mmol/L 98   CO2 22 - 32 mmol/L 26   Calcium 8.9 - 10.3 mg/dL 8.8   Total Protein 6.5 - 8.1 g/dL 7.6   Total Bilirubin 0.3 - 1.2 mg/dL 0.4    Alkaline Phos 38 - 126 U/L 52   AST 15 - 41 U/L 18   ALT 0 - 44 U/L 14       Latest Ref Rng & Units 09/05/2023   10:48 AM  CBC  WBC 4.0 - 10.5 K/uL 3.7   Hemoglobin 12.0 - 15.0 g/dL 62.9   Hematocrit 52.8 - 46.0 % 37.0   Platelets 150 - 400 K/uL 234     No images are attached to the encounter.  No results found.   Assessment and plan- Patient is a 67 y.o. female with history of metastatic ER positive HER2 negative breast cancer with lung metastases low-volume currently on Ibrance/letrozole here for routine follow-up  Patient is currently on the lowest dose of Ibrance 75 mg 3 weeks on and 1 week off.  Mild leukopenia but neutrophil count remains more than 1.  Hemoglobin and platelets are stable.  It is possible that her fatigue and hair loss is secondary to Reynolds.  We discussed continuing letrozole alone versus continuing the combination.  Patient is okay to continue combination letrozole Ibrance until her next scans.  CBC with differential CMP CA 27-29 in 2 months in 4 months and I will see her back in 4 months.  I have asked her to keep a tab of her blood pressure over  the next couple of weeks and send [place] the readings.  If she has persistent hypotension she may need to discuss midodrine with her primary care doctor.   Visit Diagnosis 1. 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 High risk medication use   3. Use of letrozole (Femara)      Dr. Owens Shark, MD, MPH [facility] [phone] 09/05/2023 1:19 PM

## 2023-09-06 ENCOUNTER — Telehealth: Payer: Self-pay | Admitting: *Deleted

## 2023-09-06 NOTE — Telephone Encounter (Signed)
Please move pet to jan. Not sure what I can do about bp listed in AVS

## 2023-09-06 NOTE — Telephone Encounter (Signed)
Patient called and reports that the b/p listed on her chart (AVS) is incorrect from what she told it was she states that it was lower than recorded. She also states that her PET was to be done in Jan and it has been scheduled for Dec [birth year] which is on her birthday and she prefers it to be January on the 7th if possible so it will be done before she sees Dr Smith Robert. She said you can just send her a My Chart message regarding b/p and PET appointment.

## 2023-09-13 ENCOUNTER — Other Ambulatory Visit: Payer: Medicare Other

## 2023-09-21 ENCOUNTER — Other Ambulatory Visit: Payer: Self-pay | Admitting: Family Medicine

## 2023-09-21 NOTE — Telephone Encounter (Signed)
Requested medication (s) are due for refill today: yes  Requested medication (s) are on the active medication list: yes  Last refill:  95/24  Future visit scheduled: no  Notes to clinic:  Unable to refill per protocol, courtesy refill already given, routing for provider approval.      Requested Prescriptions  Pending Prescriptions Disp Refills   escitalopram (LEXAPRO) 10 MG tablet [Pharmacy Med Name: ESCITALOPRAM 10 MG TABLET] 30 tablet 0    Sig: TAKE 1 TABLET BY MOUTH DAILY     Psychiatry:  Antidepressants - SSRI Failed - 09/21/2023  6:11 AM      Failed - Valid encounter within last 6 months    Recent Outpatient Visits           1 year ago Lung mass   [facility] Pickard, Priscille Heidelberg, MD   1 year ago General medical exam   [facility] Donita Brooks, MD   2 years ago Dysuria   [facility] Donita Brooks, MD   2 years ago General medical exam   [facility] Donita Brooks, MD   3 years ago Chest pain at rest   [facility] Pickard, Priscille Heidelberg, MD       Future Appointments             In 4 months Pickard, Priscille Heidelberg, MD [facility]

## 2023-09-26 NOTE — Telephone Encounter (Signed)
Second request coming in for this medication.

## 2023-10-24 ENCOUNTER — Encounter: Payer: Self-pay | Admitting: Oncology

## 2023-10-25 NOTE — Telephone Encounter (Signed)
I have asked her to stop letrozole and please send prescription for aromasin

## 2023-10-26 ENCOUNTER — Other Ambulatory Visit: Payer: Self-pay | Admitting: *Deleted

## 2023-10-26 MED ORDER — EXEMESTANE 25 MG PO TABS: 25.0000 mg | ORAL_TABLET | Freq: Every day | ORAL | 3 refills | Status: DC

## 2023-11-07 ENCOUNTER — Other Ambulatory Visit: Payer: Medicare Other

## 2023-11-09 ENCOUNTER — Ambulatory Visit (INDEPENDENT_AMBULATORY_CARE_PROVIDER_SITE_OTHER): Payer: Medicare Other | Admitting: Family Medicine

## 2023-11-09 ENCOUNTER — Encounter: Payer: Self-pay | Admitting: Family Medicine

## 2023-11-09 VITALS — BP 120/62 | HR 58 | Temp 98.0°F | Ht 61.0 in | Wt 134.8 lb

## 2023-11-09 DIAGNOSIS — R4586 Emotional lability: Secondary | ICD-10-CM | POA: Diagnosis not present

## 2023-11-09 DIAGNOSIS — R232 Flushing: Secondary | ICD-10-CM

## 2023-11-09 DIAGNOSIS — M25561 Pain in right knee: Secondary | ICD-10-CM | POA: Diagnosis not present

## 2023-11-09 DIAGNOSIS — M25562 Pain in left knee: Secondary | ICD-10-CM | POA: Diagnosis not present

## 2023-11-09 DIAGNOSIS — Z853 Personal history of malignant neoplasm of breast: Secondary | ICD-10-CM

## 2023-11-09 NOTE — Addendum Note (Signed)
Addended by: Venia Carbon K on: 11/09/2023 12:25 PM   Modules accepted: Orders

## 2023-11-09 NOTE — Progress Notes (Signed)
Subjective:    Patient ID: EPIMENIA LIBERA, female    DOB: 07/08/1956, 67 y.o.   MRN: [id number]  Knee Pain    02/2023 Unfortunately, since I last saw the patient, the abnormal lesion in her lung has been diagnosed with metastatic breast cancer.  Currently on palliative letrozole and started Ibrance.  Obviously, the patient is dealing with a tremendous amount of stress.  She also has recently lost a family member and has another family member in the hospital.  Therefore she feels overwhelmed at times.  She is reporting trouble sleeping.  Since starting the treatment for breast cancer, she is developing diarrhea and bloating.  Yesterday she took a Valium and her physical symptoms improved and she felt better.  In the past, after her first divorce, she took Prozac for depression and this really helped with stress and anxiety.  She is interested in treatment for anxiety dealing with the situation as listed above.  At that time, my plan was: Patient just got a refill of 30 Valium.  I will be glad to give her up to 60 a month.  Therefore she can take it twice a day if necessary however also recommended starting Lexapro 10 mg a day to try to help overall improve her mood and lower generalized anxiety level.  Also recommended starting a probiotic for the bloating and gas that she is experiencing.  If that does not work she can try Gas-X.  Reassess in 1 month or sooner if worsening  11/09/23  Reports bilateral knee pain for over a year.  Wants to see orthopedics for possible viscosupplementation injections.  Hurts to stand and walk.  Hurts to squat.  Pain located over the joint lines bilaterally.  ALso reports hot flashes, mood swings, irritability, and sever fatigue which she attributes to letrozole.  HOwever, she would like to check her thyroid.    Past Medical History:  Diagnosis Date   Abnormal EKG    Anxiety    Phreesia 11/29/2020   Arthritis    Phreesia 11/29/2020   Barrett's esophagus     Breast cancer (HCC) 2018   Left breast   Cancer (HCC)    Phreesia 11/29/2020   Complication of anesthesia    Family history of breast cancer    Family history of prostate cancer    Family history of thyroid cancer    Fatigue    Generalized headaches    headaches are less since using a mouth piece to prevent grinding of teeth   Hepatitis    as a teenager   IBS (irritable bowel syndrome)    Meniere disease    Osteopenia    Pneumonia    PONV (postoperative nausea and vomiting)    Psoriasis    Tinnitus    left ear   Past Surgical History:  Procedure Laterality Date   BREAST SURGERY N/A    Phreesia 11/29/2020   BRONCHIAL BIOPSY  01/17/2023   Procedure: BRONCHIAL BIOPSIES;  Surgeon: Josephine Igo, DO;  Location: [facility];  Service: Pulmonary;;   BRONCHIAL BRUSHINGS  01/17/2023   Procedure: BRONCHIAL BRUSHINGS;  Surgeon: Josephine Igo, DO;  Location: [facility];  Service: Pulmonary;;   DILATION AND CURETTAGE OF UTERUS     FINE NEEDLE ASPIRATION  05/10/2022   Procedure: FINE NEEDLE ASPIRATION (FNA) LINEAR;  Surgeon: Josephine Igo, DO;  Location: [facility];  Service: Pulmonary;;   MASTECTOMY Left 2018   MASTECTOMY W/ SENTINEL NODE BIOPSY Left 08/03/2017  Procedure: LEFT MASTECTOMY WITH LEFT AXILLARY SENTINEL LYMPH NODE BIOPSY;  Surgeon: Ovidio Kin, MD;  Location: [facility];  Service: General;  Laterality: Left;   skin cancer removal     TONSILLECTOMY     VIDEO BRONCHOSCOPY WITH ENDOBRONCHIAL ULTRASOUND N/A 05/10/2022   Procedure: VIDEO BRONCHOSCOPY WITH ENDOBRONCHIAL ULTRASOUND;  Surgeon: Josephine Igo, DO;  Location: [facility];  Service: Pulmonary;  Laterality: N/A;   VIDEO BRONCHOSCOPY WITH ENDOBRONCHIAL ULTRASOUND Right 01/17/2023   Procedure: VIDEO BRONCHOSCOPY WITH ENDOBRONCHIAL ULTRASOUND;  Surgeon: Josephine Igo, DO;  Location: [facility];  Service: Pulmonary;  Laterality: Right;   Current Outpatient Medications on File Prior to  Visit  Medication Sig Dispense Refill   atorvastatin (LIPITOR) 40 MG tablet Take 1 tablet (40 mg total) by mouth daily. 90 tablet 3   diazepam (VALIUM) 10 MG tablet TAKE ONE TABLET BY MOUTH EVERY 12 HOURS AS NEEDED FOR ANXIETY 30 tablet 3   escitalopram (LEXAPRO) 10 MG tablet TAKE 1 TABLET BY MOUTH DAILY 30 tablet 0   exemestane (AROMASIN) 25 MG tablet Take 1 tablet (25 mg total) by mouth daily after breakfast. 30 tablet 3   letrozole (FEMARA) 2.5 MG tablet TAKE 1 TABLET BY MOUTH DAILY 90 tablet 0   Multiple Vitamins-Minerals (AIRBORNE PO) Take 1 tablet by mouth daily as needed (immune support).     ondansetron (ZOFRAN) 8 MG tablet Take 1 tablet (8 mg total) by mouth every 8 (eight) hours as needed for nausea or vomiting. 20 tablet 1   palbociclib (IBRANCE) 75 MG tablet Take 1 tablet (75 mg total) by mouth daily. Take for 21 days on, 7 days off, repeat every 28 days. 21 tablet 6   Current Facility-Administered Medications on File Prior to Visit  Medication Dose Route Frequency Provider Last Rate Last Admin   0.9 %  sodium chloride infusion  500 mL Intravenous Once Jenel Lucks, MD       Allergies  Allergen Reactions   Chantix [Varenicline]     Hallucination   Codeine Nausea And Vomiting   Wellbutrin [Bupropion] Other (See Comments)    Hallucinations     Social History   Socioeconomic History   Marital status: Married    Spouse name: Not on file   Number of children: Not on file   Years of education: Not on file   Highest education level: Associate degree: occupational, Scientist, product/process development, or vocational program  Occupational History   Not on file  Tobacco Use   Smoking status: Former    Current packs/day: 0.00    Types: Cigarettes    Quit date: 2018    Years since quitting: 6.8    Passive exposure: Past   Smokeless tobacco: Never  Vaping Use   Vaping status: Never Used  Substance and Sexual Activity   Alcohol use: Yes    Comment: occas   Drug use: Never   Sexual activity:  Not Currently    Birth control/protection: Post-menopausal  Other Topics Concern   Not on file  Social History Narrative   Not on file   Social Determinants of Health   Financial Resource Strain: Medium Risk (11/06/2023)   Overall Financial Resource Strain (CARDIA)    Difficulty of Paying Living Expenses: Somewhat hard  Food Insecurity: Food Insecurity Present (11/06/2023)   Hunger Vital Sign    Worried About Running Out of Food in the Last Year: Sometimes true    Ran Out of Food in the Last Year: Never true  Transportation  Needs: No Transportation Needs (11/06/2023)   PRAPARE - Administrator, Civil Service (Medical): No    Lack of Transportation (Non-Medical): No  Physical Activity: Insufficiently Active (11/06/2023)   Exercise Vital Sign    Days of Exercise per Week: 3 days    Minutes of Exercise per Session: 30 min  Stress: Stress Concern Present (11/06/2023)   [employer] of Occupational Health - Occupational Stress Questionnaire    Feeling of Stress : Rather much  Social Connections: Moderately Integrated (11/06/2023)   Social Connection and Isolation Panel [NHANES]    Frequency of Communication with Friends and Family: More than three times a week    Frequency of Social Gatherings with Friends and Family: Once a week    Attends Religious Services: More than 4 times per year    Active Member of [employer] or Organizations: No    Attends Banker Meetings: Never    Marital Status: Married  Catering manager Violence: Not At Risk (08/03/2023)   Humiliation, Afraid, Rape, and Kick questionnaire    Fear of Current or Ex-Partner: No    Emotionally Abused: No    Physically Abused: No    Sexually Abused: No      Review of Systems  All other systems reviewed and are negative.      Objective:   Physical Exam Vitals reviewed.  Constitutional:      General: She is not in acute distress.    Appearance: She is well-developed. She is not  diaphoretic.  HENT:     Head: Normocephalic and atraumatic.  Neck:     Thyroid: No thyromegaly.     Vascular: No JVD.     Trachea: No tracheal deviation.  Cardiovascular:     Rate and Rhythm: Regular rhythm.     Heart sounds: Normal heart sounds. No murmur heard.    No friction rub. No gallop.  Pulmonary:     Effort: Pulmonary effort is normal. No respiratory distress.     Breath sounds: Normal breath sounds. No stridor. No wheezing or rales.  Chest:     Chest wall: No tenderness.  Musculoskeletal:     Right knee: Decreased range of motion. Tenderness present over the medial joint line and lateral joint line.     Left knee: Decreased range of motion. Tenderness present over the medial joint line and lateral joint line.  Neurological:     Mental Status: She is alert.     Motor: No abnormal muscle tone.           Assessment & Plan:  History of breast cancer - Plan: CBC with Differential/Platelet, COMPLETE METABOLIC PANEL WITH GFR, CA 27.29 (SERIAL MONITOR), TSH  Acute pain of both knees - Plan: Ambulatory referral to Orthopedic Surgery  Mood swings - Plan: CBC with Differential/Platelet, COMPLETE METABOLIC PANEL WITH GFR, CA 27.29 (SERIAL MONITOR), TSH  Hot flashes - Plan: CBC with Differential/Platelet, COMPLETE METABOLIC PANEL WITH GFR, CA 27.29 (SERIAL MONITOR), TSH Suspect hot flashes, fatigue and mood swings are due to letrozole.  Will check tsh and cbc and cmp to rule out other potential causes.  Consult orthopedics for bilateral knee pain as patient is interested in hyalgan injections.

## 2023-11-10 LAB — CBC WITH DIFFERENTIAL/PLATELET
Absolute Lymphocytes: 1624 {cells}/uL (ref 850–3900)
Absolute Monocytes: 466 {cells}/uL (ref 200–950)
Basophils Absolute: 101 {cells}/uL (ref 0–200)
Basophils Relative: 2.3 %
Eosinophils Absolute: 163 {cells}/uL (ref 15–500)
Eosinophils Relative: 3.7 %
HCT: 34.8 % — ABNORMAL LOW (ref 35.0–45.0)
Hemoglobin: 11.4 g/dL — ABNORMAL LOW (ref 11.7–15.5)
MCH: 31.9 pg (ref 27.0–33.0)
MCHC: 32.8 g/dL (ref 32.0–36.0)
MCV: 97.5 fL (ref 80.0–100.0)
MPV: 9.6 fL (ref 7.5–12.5)
Monocytes Relative: 10.6 %
Neutro Abs: 2046 {cells}/uL (ref 1500–7800)
Neutrophils Relative %: 46.5 %
Platelets: 341 10*3/uL (ref 140–400)
RBC: 3.57 10*6/uL — ABNORMAL LOW (ref 3.80–5.10)
RDW: 13.7 % (ref 11.0–15.0)
Total Lymphocyte: 36.9 %
WBC: 4.4 10*3/uL (ref 3.8–10.8)

## 2023-11-10 LAB — COMPLETE METABOLIC PANEL WITH GFR
AG Ratio: 1.5 (calc) (ref 1.0–2.5)
ALT: 10 U/L (ref 6–29)
AST: 15 U/L (ref 10–35)
Albumin: 4.3 g/dL (ref 3.6–5.1)
Alkaline phosphatase (APISO): 79 U/L (ref 37–153)
BUN/Creatinine Ratio: 8 (calc) (ref 6–22)
BUN: 6 mg/dL — ABNORMAL LOW (ref 7–25)
CO2: 22 mmol/L (ref 20–32)
Calcium: 8.9 mg/dL (ref 8.6–10.4)
Chloride: 99 mmol/L (ref 98–110)
Creat: 0.8 mg/dL (ref 0.50–1.05)
Globulin: 2.8 g/dL (ref 1.9–3.7)
Glucose, Bld: 84 mg/dL (ref 65–99)
Potassium: 4.4 mmol/L (ref 3.5–5.3)
Sodium: 135 mmol/L (ref 135–146)
Total Bilirubin: 0.3 mg/dL (ref 0.2–1.2)
Total Protein: 7.1 g/dL (ref 6.1–8.1)
eGFR: 81 mL/min/{1.73_m2} (ref >=60)

## 2023-11-10 LAB — TSH: TSH: 1.46 m[IU]/L (ref 0.40–4.50)

## 2023-11-10 LAB — CANCER ANTIGEN 27.29: CA 27.29: 26 U/mL (ref <38)

## 2023-11-15 ENCOUNTER — Other Ambulatory Visit: Payer: Self-pay | Admitting: Family Medicine

## 2023-11-15 NOTE — Telephone Encounter (Signed)
Requested Prescriptions  Pending Prescriptions Disp Refills   escitalopram (LEXAPRO) 10 MG tablet [Pharmacy Med Name: ESCITALOPRAM 10 MG TABLET] 90 tablet 1    Sig: TAKE 1 TABLET BY MOUTH DAILY     Psychiatry:  Antidepressants - SSRI Failed - 11/15/2023  6:10 AM      Failed - Valid encounter within last 6 months    Recent Outpatient Visits           1 year ago Lung mass   [facility] Pickard, Priscille Heidelberg, MD   1 year ago General medical exam   [facility] Donita Brooks, MD   2 years ago Dysuria   [facility] Donita Brooks, MD   2 years ago General medical exam   [facility] Donita Brooks, MD   3 years ago Chest pain at rest   [facility] Pickard, Priscille Heidelberg, MD       Future Appointments             In 2 months Pickard, Priscille Heidelberg, MD [facility], [facility]

## 2023-11-22 ENCOUNTER — Telehealth: Payer: Self-pay | Admitting: *Deleted

## 2023-11-22 MED ORDER — ANASTROZOLE 1 MG PO TABS: 1.0000 mg | ORAL_TABLET | Freq: Every day | ORAL | 0 refills | Status: DC

## 2023-11-22 NOTE — Telephone Encounter (Signed)
Ok to continue [employer]

## 2023-11-22 NOTE — Telephone Encounter (Signed)
Call returned to patient and I left message on her voice mail to stop the Exemestane for 2 -3 weeks and to start Arimidex after Christmas. Prescription sent to pharmacy

## 2023-11-22 NOTE — Telephone Encounter (Signed)
Patient called back asking if she is to continue taking the  Ibrance or not since she will not be on and AI for the next few weeks. Please advise

## 2023-11-22 NOTE — Telephone Encounter (Signed)
Patient called reporting that she cannot tolerate the Exemestane and that it is worse than the Letrozole was. She has diarrhea and nausea, mood swings and ' I feel evil, telling my husband I hate him, smacking my dog. Don't want to go out in public because I am evil". Last dose was yesterday morning. Please advise

## 2023-11-22 NOTE — Telephone Encounter (Signed)
Stop exemestane for next 2-3 weeks and we will send her armidex which she can start taking after christmas

## 2023-11-22 NOTE — Telephone Encounter (Signed)
Call returned to patient and informed that ok to take her Monique [name]

## 2023-11-23 ENCOUNTER — Other Ambulatory Visit: Payer: Self-pay | Admitting: Family Medicine

## 2023-11-24 NOTE — Telephone Encounter (Signed)
Requested medication (s) are due for refill today -yes   Requested medication (s) are on the active medication list -yes  Future visit scheduled --yes  Last refill: 06/01/23 #30 3RF  Notes to clinic: non delegated Rx  Requested Prescriptions  Pending Prescriptions Disp Refills   diazepam (VALIUM) 10 MG tablet [Pharmacy Med Name: diazePAM 10 MG TABLET] 30 tablet     Sig: TAKE ONE TABLET BY MOUTH EVERY 12 HOURS AS NEEDED FOR ANXIETY     Not Delegated - Psychiatry: Anxiolytics/Hypnotics 2 Failed - 11/23/2023 10:13 AM      Failed - This refill cannot be delegated      Failed - Urine Drug Screen completed in last 360 days      Failed - Valid encounter within last 6 months    Recent Outpatient Visits           1 year ago Lung mass   [facility] Pickard, Priscille Heidelberg, MD   1 year ago General medical exam   [facility] Donita Brooks, MD   2 years ago Dysuria   [facility] Tanya Nones, Priscille Heidelberg, MD   2 years ago General medical exam   [facility] Donita Brooks, MD   3 years ago Chest pain at rest   [facility] Pickard, Priscille Heidelberg, MD       Future Appointments             In 2 months Pickard, Priscille Heidelberg, MD [facility], [facility]            Passed - Patient is not pregnant         Requested Prescriptions  Pending Prescriptions Disp Refills   diazepam (VALIUM) 10 MG tablet [Pharmacy Med Name: diazePAM 10 MG TABLET] 30 tablet     Sig: TAKE ONE TABLET BY MOUTH EVERY 12 HOURS AS NEEDED FOR ANXIETY     Not Delegated - Psychiatry: Anxiolytics/Hypnotics 2 Failed - 11/23/2023 10:13 AM      Failed - This refill cannot be delegated      Failed - Urine Drug Screen completed in last 360 days      Failed - Valid encounter within last 6 months    Recent Outpatient Visits           1 year ago Lung mass   [facility] Pickard, Priscille Heidelberg, MD   1 year ago  General medical exam   [facility] Donita Brooks, MD   2 years ago Dysuria   [facility] Donita Brooks, MD   2 years ago General medical exam   [facility] Donita Brooks, MD   3 years ago Chest pain at rest   [facility] Pickard, Priscille Heidelberg, MD       Future Appointments             In 2 months Pickard, Priscille Heidelberg, MD [facility], [facility]            Passed - Patient is not pregnant

## 2023-11-24 NOTE — Telephone Encounter (Signed)
Pharmacy sent script to follow up on refill requested for diazepam (VALIUM) 10 MG tablet   LOV 11/09/23.  Pharmacy:  [facility] [id number] - [address], [place] - [address] [facility] [address] [place], [place] [address] Phone: [phone]  Fax: [phone]   Please advise pharmacist.

## 2023-11-27 ENCOUNTER — Telehealth: Payer: Self-pay

## 2023-11-27 ENCOUNTER — Other Ambulatory Visit: Payer: Self-pay | Admitting: Family Medicine

## 2023-11-27 MED ORDER — DIAZEPAM 10 MG PO TABS: 10.0000 mg | ORAL_TABLET | Freq: Two times a day (BID) | ORAL | 3 refills | Status: DC | PRN

## 2023-11-27 NOTE — Telephone Encounter (Signed)
Prescription Request  11/27/2023  LOV: 11/09/23  What is the name of the medication or equipment? diazepam (VALIUM) 10 MG tablet [[id number]]   Have you contacted your pharmacy to request a refill? Yes   Which pharmacy would you like this sent to?  [facility] [id number] [place], [place] - [address] [facility] [address] [place] [address] Phone: [phone] Fax: [phone]    Patient notified that their request is being sent to the clinical staff for review and that they should receive a response within 2 business days.   Please advise at [facility] [phone]

## 2023-11-29 ENCOUNTER — Telehealth: Payer: Self-pay

## 2023-11-29 NOTE — Telephone Encounter (Signed)
Oral Oncology Patient Advocate Encounter   **[employer] PAP to [facility] in Jan 2025**  Was successful in securing patient a $$6,000.00 grant from [employer] to provide copayment coverage for [place].  This will keep the out of pocket expense at $0.     I have spoken with the patient.    The billing information is as follows and has been shared with [facility].   Member ID: [id number] Group ID: [id number] RxBin: [id number] PCN: [id number] Dates of Eligibility: 10/31/23 through 10/30/24  Fund name:  Metastatic Breast Cancer.   Ardeen Fillers, CPhT Oncology Pharmacy Patient Advocate  [facility]  [phone] (phone) [phone] (fax) 11/29/2023

## 2023-12-06 ENCOUNTER — Other Ambulatory Visit: Payer: Medicare Other

## 2023-12-07 ENCOUNTER — Other Ambulatory Visit: Payer: Self-pay | Admitting: Family Medicine

## 2023-12-07 DIAGNOSIS — E7800 Pure hypercholesterolemia, unspecified: Secondary | ICD-10-CM

## 2023-12-14 NOTE — Telephone Encounter (Signed)
Called and spoke with patient regarding grant and transitioning to [employer] for 2025. Patient indicated they had several months worth of Ibrance on hand and requested I call back towards the end of January. Reminder has been set for Monday, 01/15/24.    Ardeen Fillers, CPhT Oncology Pharmacy Patient Advocate  [facility]  [phone] (phone) [phone] (fax) 12/14/2023 2:47 PM

## 2023-12-24 ENCOUNTER — Other Ambulatory Visit: Payer: Self-pay | Admitting: Oncology

## 2023-12-26 ENCOUNTER — Ambulatory Visit
Admission: RE | Admit: 2023-12-26 | Discharge: 2023-12-26 | Disposition: A | Discharge status: Home / Self Care | Payer: Medicare Other | Source: Ambulatory Visit | Attending: Oncology | Admitting: Oncology

## 2023-12-26 DIAGNOSIS — C7800 Secondary malignant neoplasm of unspecified lung: Secondary | ICD-10-CM | POA: Diagnosis not present

## 2023-12-26 DIAGNOSIS — C50919 Malignant neoplasm of unspecified site of unspecified female breast: Secondary | ICD-10-CM | POA: Diagnosis not present

## 2023-12-26 LAB — GLUCOSE, CAPILLARY: Glucose-Capillary: 97 mg/dL (ref 70–99)

## 2023-12-26 MED ORDER — FLUDEOXYGLUCOSE F - 18 (FDG) INJECTION
6.9000 | Freq: Once | INTRAVENOUS | Status: AC | PRN
  Administered 2023-12-26: 7.13 via INTRAVENOUS

## 2024-01-02 ENCOUNTER — Ambulatory Visit: Payer: Medicare Other | Admitting: Oncology

## 2024-01-02 ENCOUNTER — Other Ambulatory Visit: Payer: Medicare Other

## 2024-01-08 ENCOUNTER — Other Ambulatory Visit: Payer: Self-pay | Admitting: *Deleted

## 2024-01-08 ENCOUNTER — Ambulatory Visit: Payer: Medicare Other | Admitting: Oncology

## 2024-01-08 ENCOUNTER — Other Ambulatory Visit: Payer: Medicare Other

## 2024-01-08 DIAGNOSIS — C50919 Malignant neoplasm of unspecified site of unspecified female breast: Secondary | ICD-10-CM

## 2024-01-09 ENCOUNTER — Inpatient Hospital Stay: Payer: Medicare Other | Admitting: Oncology

## 2024-01-09 ENCOUNTER — Other Ambulatory Visit: Payer: Self-pay

## 2024-01-09 ENCOUNTER — Inpatient Hospital Stay: Payer: Medicare Other | Attending: Oncology

## 2024-01-09 ENCOUNTER — Encounter: Payer: Self-pay | Admitting: Oncology

## 2024-01-09 VITALS — BP 112/79 | HR 59 | Temp 97.6°F | Resp 18 | Wt 136.6 lb

## 2024-01-09 DIAGNOSIS — Z87891 Personal history of nicotine dependence: Secondary | ICD-10-CM | POA: Insufficient documentation

## 2024-01-09 DIAGNOSIS — Z170 Estrogen receptor positive status [ER+]: Secondary | ICD-10-CM | POA: Diagnosis not present

## 2024-01-09 DIAGNOSIS — D702 Other drug-induced agranulocytosis: Secondary | ICD-10-CM

## 2024-01-09 DIAGNOSIS — C50912 Malignant neoplasm of unspecified site of left female breast: Secondary | ICD-10-CM | POA: Diagnosis not present

## 2024-01-09 DIAGNOSIS — Z5181 Encounter for therapeutic drug level monitoring: Secondary | ICD-10-CM | POA: Diagnosis not present

## 2024-01-09 DIAGNOSIS — Z9012 Acquired absence of left breast and nipple: Secondary | ICD-10-CM | POA: Insufficient documentation

## 2024-01-09 DIAGNOSIS — Z79811 Long term (current) use of aromatase inhibitors: Secondary | ICD-10-CM

## 2024-01-09 DIAGNOSIS — Z1721 Progesterone receptor positive status: Secondary | ICD-10-CM | POA: Insufficient documentation

## 2024-01-09 DIAGNOSIS — T451X5D Adverse effect of antineoplastic and immunosuppressive drugs, subsequent encounter: Secondary | ICD-10-CM | POA: Insufficient documentation

## 2024-01-09 DIAGNOSIS — D701 Agranulocytosis secondary to cancer chemotherapy: Secondary | ICD-10-CM | POA: Diagnosis not present

## 2024-01-09 DIAGNOSIS — C7800 Secondary malignant neoplasm of unspecified lung: Secondary | ICD-10-CM | POA: Diagnosis not present

## 2024-01-09 DIAGNOSIS — Z79899 Other long term (current) drug therapy: Secondary | ICD-10-CM

## 2024-01-09 DIAGNOSIS — C50919 Malignant neoplasm of unspecified site of unspecified female breast: Secondary | ICD-10-CM | POA: Diagnosis not present

## 2024-01-09 DIAGNOSIS — Z1732 Human epidermal growth factor receptor 2 negative status: Secondary | ICD-10-CM | POA: Insufficient documentation

## 2024-01-09 LAB — CMP (CANCER CENTER ONLY)
ALT: 12 U/L (ref 0–44)
AST: 18 U/L (ref 15–41)
Albumin: 4.4 g/dL (ref 3.5–5.0)
Alkaline Phosphatase: 55 U/L (ref 38–126)
Anion gap: 11 (ref 5–15)
BUN: 11 mg/dL (ref 8–23)
CO2: 25 mmol/L (ref 22–32)
Calcium: 9 mg/dL (ref 8.9–10.3)
Chloride: 96 mmol/L — ABNORMAL LOW (ref 98–111)
Creatinine: 0.88 mg/dL (ref 0.44–1.00)
GFR, Estimated: >60 mL/min (ref >60)
Glucose, Bld: 96 mg/dL (ref 70–99)
Potassium: 4.2 mmol/L (ref 3.5–5.1)
Sodium: 132 mmol/L — ABNORMAL LOW (ref 135–145)
Total Bilirubin: 0.7 mg/dL (ref 0.0–1.2)
Total Protein: 7.8 g/dL (ref 6.5–8.1)

## 2024-01-09 LAB — CBC WITH DIFFERENTIAL (CANCER CENTER ONLY)
Basophils Absolute: 0.1 10*3/uL (ref 0.0–0.1)
Basophils Relative: 3 %
Eosinophils Absolute: 0.1 10*3/uL (ref 0.0–0.5)
Eosinophils Relative: 3 %
HCT: 35.8 % — ABNORMAL LOW (ref 36.0–46.0)
Hemoglobin: 11.9 g/dL — ABNORMAL LOW (ref 12.0–15.0)
Lymphocytes Relative: 43 %
Lymphs Abs: 1.5 10*3/uL (ref 0.7–4.0)
MCH: 32 pg (ref 26.0–34.0)
MCHC: 33.2 g/dL (ref 30.0–36.0)
MCV: 96.2 fL (ref 80.0–100.0)
Monocytes Absolute: 0.2 10*3/uL (ref 0.1–1.0)
Monocytes Relative: 6 %
Neutro Abs: 1.6 10*3/uL — ABNORMAL LOW (ref 1.7–7.7)
Neutrophils Relative %: 45 %
Platelet Count: 360 10*3/uL (ref 150–400)
RBC: 3.72 MIL/uL — ABNORMAL LOW (ref 3.87–5.11)
RDW: 14.5 % (ref 11.5–15.5)
Smear Review: ADEQUATE
WBC Count: 3.5 10*3/uL — ABNORMAL LOW (ref 4.0–10.5)
nRBC: 0 % (ref 0.0–0.2)

## 2024-01-09 MED ORDER — ANASTROZOLE 1 MG PO TABS
1.0000 mg | ORAL_TABLET | Freq: Every day | ORAL | 3 refills | Status: DC
  Filled 2024-05-01 – 2024-07-09 (×3): qty 90, 90d supply, fill #0
  Filled 2024-10-04 – 2024-10-08 (×2): qty 90, 90d supply, fill #1

## 2024-01-09 NOTE — Progress Notes (Signed)
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  This was ER 100% positive, PR 95% positive HER2 negative Ki-67 15%.  She underwent left mastectomy and did not require any adjuvant radiation therapy.  Oncotype score came back low at 5 and therefore did not require any adjuvant chemotherapy.  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  Patient was initially started on Arimidex which she could not tolerate due to dizziness and was switched to tamoxifen which she could also not tolerate and subsequently stopped taking endocrine therapy shortly.  She was seen by both Dr. Pamelia Hoit and Dr. Mosetta Putt at [facility] back in 2018 and was subsequently lost to follow-up   Patient has been seeing Dr. Tonia Brooms this for evaluation of bilateral lung nodules and abnormal lung cancer screening CT noted in March 2023.  At that time she had a hypermetabolism with an SUV of 6 in the right hilum.  She had a bronchoscopy done and biopsies of the left bronchoscopy were negative as well as  that of the hilum was negative.  There was appearance of a right lower lobe lung nodule which was being followed given that it was spiculated.  There was a plan for repeat bronchoscopy in November 2023 but Dr. Tonia Brooms was sick at that time and patient did not wish to get bronchoscopy by any other pulmonary.  Bronchoscopy was therefore delayed and patient had PET CT scan in January 2024.  PET scan showed intense hypermetabolic activity again in the right hilum with no clear lesion.  Findings concerning for neoplasm obstructing the bronchus with the right middle lobe atelectasis.  Low level of metabolic activity in the right lower lobe.  No other evidence of distant metastatic disease.  Prior to that in October 2023 patient was noted to have bilateral lung nodules varying from 4 to 8 mm in size which could be potentially metastatic.   Had repeat bronchoscopy on 01/17/2023.  Right upper lobe endobronchial lesion brushing was positive for metastatic breast carcinoma.  Tumor cells positive for GATA3 and ER.  Negative for CK5/6, p40, TTF-1, chromogranin, Napsin synaptophysin and CD56.  Patient is consistent with metastatic carcinoma of breast origin. Tumor was 90% ER positive, 85% PR positive and HER2 negative. Patient started on letrozole in February 2024.  Ibrance started in March 2024.  Patient had significant arthralgias with letrozole subsequently and was switched to Arimidex  Interval history-she feels that she is tolerating Arimidex better than letrozole although she has on and off jaw pain while chewing.  She still feels that she can put up with Arimidex better than letrozole.  She has ongoing fatigue and appetite is fair.  Tolerating Ibrance otherwise relatively  well  ECOG PS- 1 Pain scale- 0 Opioid associated constipation- no  Review of systems- Review of Systems  Constitutional:  Positive for malaise/fatigue.  HENT:         Jaw pain      Allergies  Allergen Reactions   Chantix [Varenicline]      Hallucination   Codeine Nausea And Vomiting   Wellbutrin [Bupropion] Other (See Comments)    Hallucinations       Past Medical History:  Diagnosis Date   Abnormal EKG    Anxiety    Phreesia 11/29/2020   Arthritis    Phreesia 11/29/2020   Barrett's esophagus    Breast cancer (HCC) 2018   Left breast   Cancer (HCC)    Phreesia 11/29/2020   Complication of anesthesia    Family history of breast cancer    Family history of prostate cancer    Family history of thyroid cancer    Fatigue    Generalized headaches    headaches are less since using a mouth piece to prevent grinding of teeth   Hepatitis    as a teenager   IBS (irritable bowel syndrome)    Meniere disease    Osteopenia    Pneumonia    PONV (postoperative nausea and vomiting)    Psoriasis    Tinnitus    left ear     Past Surgical History:  Procedure Laterality Date   BREAST SURGERY N/A    Phreesia 11/29/2020   BRONCHIAL BIOPSY  01/17/2023   Procedure: BRONCHIAL BIOPSIES;  Surgeon: Josephine Igo, DO;  Location: [facility];  Service: Pulmonary;;   BRONCHIAL BRUSHINGS  01/17/2023   Procedure: BRONCHIAL BRUSHINGS;  Surgeon: Josephine Igo, DO;  Location: [facility];  Service: Pulmonary;;   DILATION AND CURETTAGE OF UTERUS     FINE NEEDLE ASPIRATION  05/10/2022   Procedure: FINE NEEDLE ASPIRATION (FNA) LINEAR;  Surgeon: Josephine Igo, DO;  Location: [facility];  Service: Pulmonary;;   MASTECTOMY Left 2018   MASTECTOMY W/ SENTINEL NODE BIOPSY Left 08/03/2017   Procedure: LEFT MASTECTOMY WITH LEFT AXILLARY SENTINEL LYMPH NODE BIOPSY;  Surgeon: Ovidio Kin, MD;  Location: [facility];  Service: General;  Laterality: Left;   skin cancer removal     TONSILLECTOMY     VIDEO BRONCHOSCOPY WITH ENDOBRONCHIAL ULTRASOUND N/A 05/10/2022   Procedure: VIDEO BRONCHOSCOPY WITH ENDOBRONCHIAL ULTRASOUND;  Surgeon: Josephine Igo, DO;  Location: [facility];  Service: Pulmonary;  Laterality: N/A;    VIDEO BRONCHOSCOPY WITH ENDOBRONCHIAL ULTRASOUND Right 01/17/2023   Procedure: VIDEO BRONCHOSCOPY WITH ENDOBRONCHIAL ULTRASOUND;  Surgeon: Josephine Igo, DO;  Location: [facility];  Service: Pulmonary;  Laterality: Right;    Social History   Socioeconomic History   Marital status: Married    Spouse name: Not on file   Number of children: Not on file   Years of education: Not on file   Highest education level: Associate degree: occupational, Scientist, product/process development, or vocational program  Occupational History   Not on file  Tobacco Use   Smoking status: Former    Current packs/day: 0.00    Types: Cigarettes    Quit date: 2018    Years since quitting: 7.0    Passive exposure: Past   Smokeless tobacco: Never  Vaping Use   Vaping status: Never Used  Substance and Sexual Activity   Alcohol use: Yes    Comment: occas   Drug use: Never   Sexual activity: Not Currently  Birth control/protection: Post-menopausal  Other Topics Concern   Not on file  Social History Narrative   Not on file   Social Drivers of Health   Financial Resource Strain: Medium Risk (11/06/2023)   Overall Financial Resource Strain (CARDIA)    Difficulty of Paying Living Expenses: Somewhat hard  Food Insecurity: Food Insecurity Present (11/06/2023)   Hunger Vital Sign    Worried About Running Out of Food in the Last Year: Sometimes true    Ran Out of Food in the Last Year: Never true  Transportation Needs: No Transportation Needs (11/06/2023)   PRAPARE - Administrator, Civil Service (Medical): No    Lack of Transportation (Non-Medical): No  Physical Activity: Insufficiently Active (11/06/2023)   Exercise Vital Sign    Days of Exercise per Week: 3 days    Minutes of Exercise per Session: 30 min  Stress: Stress Concern Present (11/06/2023)   [employer] of Occupational Health - Occupational Stress Questionnaire    Feeling of Stress : Rather much  Social Connections: Moderately Integrated  (11/06/2023)   Social Connection and Isolation Panel [NHANES]    Frequency of Communication with Friends and Family: More than three times a week    Frequency of Social Gatherings with Friends and Family: Once a week    Attends Religious Services: More than 4 times per year    Active Member of [employer] or Organizations: No    Attends Banker Meetings: Never    Marital Status: Married  Catering manager Violence: Not At Risk (08/03/2023)   Humiliation, Afraid, Rape, and Kick questionnaire    Fear of Current or Ex-Partner: No    Emotionally Abused: No    Physically Abused: No    Sexually Abused: No    Family History  Problem Relation Age of Onset   Hypertension Mother    Heart attack Father    Hypertension Father    Healthy Maternal Aunt    Healthy Maternal Uncle    Breast cancer Paternal Aunt 14       bilateral   Healthy Paternal Aunt    Prostate cancer Paternal Uncle    Thyroid cancer Paternal Uncle    Healthy Paternal Uncle    Breast cancer Cousin 73       paternal first cousin   Breast cancer Cousin        paternal first cousin   Colon cancer Neg Hx    Esophageal cancer Neg Hx    Rectal cancer Neg Hx    Stomach cancer Neg Hx      Current Outpatient Medications:    anastrozole (ARIMIDEX) 1 MG tablet, Take 1 tablet (1 mg total) by mouth daily., Disp: 90 tablet, Rfl: 3   atorvastatin (LIPITOR) 40 MG tablet, TAKE 1 TABLET BY MOUTH DAILY, Disp: 90 tablet, Rfl: 3   diazepam (VALIUM) 10 MG tablet, Take 1 tablet (10 mg total) by mouth every 12 (twelve) hours as needed for anxiety., Disp: 30 tablet, Rfl: 3   escitalopram (LEXAPRO) 10 MG tablet, TAKE 1 TABLET BY MOUTH DAILY, Disp: 90 tablet, Rfl: 1   Multiple Vitamins-Minerals (AIRBORNE PO), Take 1 tablet by mouth daily as needed (immune support)., Disp: , Rfl:    ondansetron (ZOFRAN) 8 MG tablet, Take 1 tablet (8 mg total) by mouth every 8 (eight) hours as needed for nausea or vomiting., Disp: 20 tablet, Rfl: 1    palbociclib (IBRANCE) 75 MG tablet, Take 1 tablet (75 mg total) by mouth daily. Take  for 21 days on, 7 days off, repeat every 28 days., Disp: 21 tablet, Rfl: 6  Current Facility-Administered Medications:    0.9 %  sodium chloride infusion, 500 mL, Intravenous, Once, Jenel Lucks, MD  Physical exam:  Vitals:   01/09/24 1054  BP: 112/79  Pulse: (!) 59  Resp: 18  Temp: 97.6 F (36.4 C)  TempSrc: Tympanic  SpO2: 100%  Weight: 136 lb 9.6 oz (62 kg)   Physical Exam Cardiovascular:     Rate and Rhythm: Normal rate and regular rhythm.     Heart sounds: Normal heart sounds.  Pulmonary:     Effort: Pulmonary effort is normal.     Breath sounds: Normal breath sounds.  Skin:    General: Skin is warm and dry.  Neurological:     Mental Status: She is alert and oriented to person, place, and time.         Latest Ref Rng & Units 01/09/2024   10:19 AM  CMP  Glucose 70 - 99 mg/dL 96   BUN 8 - 23 mg/dL 11   Creatinine 5.62 - 1.00 mg/dL 1.30   Sodium 865 - 784 mmol/L 132   Potassium 3.5 - 5.1 mmol/L 4.2   Chloride 98 - 111 mmol/L 96   CO2 22 - 32 mmol/L 25   Calcium 8.9 - 10.3 mg/dL 9.0   Total Protein 6.5 - 8.1 g/dL 7.8   Total Bilirubin 0.0 - 1.2 mg/dL 0.7   Alkaline Phos 38 - 126 U/L 55   AST 15 - 41 U/L 18   ALT 0 - 44 U/L 12       Latest Ref Rng & Units 01/09/2024   10:19 AM  CBC  WBC 4.0 - 10.5 K/uL 3.5   Hemoglobin 12.0 - 15.0 g/dL 69.6   Hematocrit 29.5 - 46.0 % 35.8   Platelets 150 - 400 K/uL 360     No images are attached to the encounter.  NM PET Image Restag (PS) Skull Base To Thigh Result Date: 01/07/2024 CLINICAL DATA:  Subsequent treatment strategy for metastatic left breast cancer. EXAM: NUCLEAR MEDICINE PET SKULL BASE TO THIGH TECHNIQUE: 7.13 mCi F-18 FDG was injected intravenously. Full-ring PET imaging was performed from the skull base to thigh after the radiotracer. CT data was obtained and used for attenuation correction and anatomic localization.  Fasting blood glucose: 97 mg/dl COMPARISON:  PET-CT 28/41/3244 and 12/21/2022. FINDINGS: Mediastinal blood pool activity: SUV max 2.2 NECK: No hypermetabolic cervical lymph nodes are identified. No suspicious activity identified within the pharyngeal mucosal space. Incidental CT findings: none CHEST: There are no hypermetabolic mediastinal, hilar or axillary lymph nodes. Hypermetabolic activity at the inferior portion of the right upper lobe bronchus has improved, currently having an SUV max of 5.2 (previously 5.9). There is persistent partial right upper lobe collapse. No peripheral hypermetabolic pulmonary activity or suspicious nodularity. Incidental CT findings: Atherosclerosis of the aorta, great vessels and coronary arteries. Mild centrilobular emphysema with stable linear scarring in both lungs. ABDOMEN/PELVIS: There is no hypermetabolic activity within the liver, adrenal glands, spleen or pancreas. There is no hypermetabolic nodal activity in the abdomen or pelvis. Incidental CT findings: Mild aortoiliac atherosclerosis. Stable cyst within the interpolar region of the right kidney for which no specific follow-up imaging is recommended. SKELETON: There is no hypermetabolic activity to suggest osseous metastatic disease. Incidental CT findings: Low level activity at the right costovertebral junction, likely reactive. Previous left mastectomy. No evidence of recurrent chest wall mass. IMPRESSION:  1. Persistent partial right upper lobe collapse with improving hypermetabolic activity at the inferior portion of the right upper lobe bronchus. 2. No evidence of local recurrence of breast cancer or other metastatic disease. 3. Aortic Atherosclerosis (ICD10-I70.0) and Emphysema (ICD10-J43.9). Electronically Signed   By: Carey Bullocks M.D.   On: 01/07/2024 11:09     Assessment and plan- Patient is a 68 y.o. female with history of ER/PR positive HER2 negative metastatic breast cancer with isolated intrabronchial  metastases currently on Arimidex plus Ibrance here for routine follow-up  I have reviewed PET TEE images independently and discussed findings with the patient which does not show any evidence of recurrent or progressive disease.  Hypermetabolic activity in the inferior portion of the right upper lobe bronchus has mildly decreased from a prior value of 5.9-5.2 presently.  She is tolerating Arimidex better than letrozole and will continue that for now.  We discussed either continuing Ibrance or stopping it at some point.  Discussed pros and cons of each approach.  Patient would like to continue the combination for now.  She has mild leukopenia but ANC remains more than 1 and she is on a lower dose of Ibrance at 75 mg 3 weeks on and 1 week off.  I will see her back in 3 months with CBC with differential CMP and CA 27-29   Visit Diagnosis 1. 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 Visit for monitoring Arimidex therapy   3. High risk medication use   4. Drug-induced neutropenia (HCC)      Dr. Owens Shark, MD, MPH [facility] [phone] 01/09/2024 1:39 PM

## 2024-01-10 LAB — CA 27.29 (SERIAL MONITOR): CA 27.29: 32.6 U/mL (ref 0.0–38.6)

## 2024-01-15 ENCOUNTER — Encounter: Payer: Self-pay | Admitting: Obstetrics & Gynecology

## 2024-01-15 ENCOUNTER — Ambulatory Visit: Payer: Medicare Other | Admitting: Obstetrics & Gynecology

## 2024-01-15 VITALS — BP 130/72 | HR 67 | Ht 60.5 in | Wt 132.0 lb

## 2024-01-15 DIAGNOSIS — N952 Postmenopausal atrophic vaginitis: Secondary | ICD-10-CM

## 2024-01-15 DIAGNOSIS — Z01419 Encounter for gynecological examination (general) (routine) without abnormal findings: Secondary | ICD-10-CM

## 2024-01-15 DIAGNOSIS — L409 Psoriasis, unspecified: Secondary | ICD-10-CM | POA: Diagnosis not present

## 2024-01-15 DIAGNOSIS — T887XXA Unspecified adverse effect of drug or medicament, initial encounter: Secondary | ICD-10-CM

## 2024-01-15 MED ORDER — HYDROCORTISONE 2.5 % EX CREA
TOPICAL_CREAM | Freq: Two times a day (BID) | CUTANEOUS | 2 refills | Status: AC
  Filled 2024-05-01: qty 30, 15d supply, fill #0
  Filled 2024-05-17: qty 30, 30d supply, fill #0

## 2024-01-15 NOTE — Progress Notes (Signed)
WELL-WOMAN EXAMINATION Patient name: Monique [name] MRN [id number]  Date of birth: 04-10-1956 Chief Complaint:   Annual Exam  History of Present Illness:   Monique [name] is a 68 y.o. G1P0010 PM with Stage 4 breast Ca presents for annual exam  Currently under care of oncology- noting side effects from medications- mostly mood changes and vasomotor symptoms.  Doing better with current medication though still having difficulty.  Also notes slight discharge from medication- denies itching, odor or irritation.  H/o psoriasis- pt brought in old bottle of hydrocortisone- desires refill  No LMP recorded. Patient is postmenopausal.  The current method of family planning is status post hysterectomy.    Last pap 2023.  Last mammogram: recently diagnosed with metastatic breast Ca. Last colonoscopy: 2022     01/15/2024    1:29 PM 11/09/2023   12:25 PM 08/03/2023   12:37 PM 01/27/2023    2:21 PM 12/02/2022    8:30 AM  Depression screen PHQ 2/9  Decreased Interest 2 0 1 1 1   Down, Depressed, Hopeless 2 0 1 1 1   PHQ - 2 Score 4 0 2 2 2   Altered sleeping 2  0  0  Tired, decreased energy 3  1  1   Change in appetite 3  1  2   Feeling bad or failure about yourself  2  1  1   Trouble concentrating 1  0  0  Moving slowly or fidgety/restless 0  0  0  Suicidal thoughts 0  0  0  PHQ-9 Score 15  5  6   Difficult doing work/chores     Somewhat difficult      Review of Systems:   Pertinent items are noted in HPI Denies any headaches, blurred vision, fatigue, shortness of breath, chest pain, abdominal pain, bowel movements, urination, or intercourse unless otherwise stated above.  Pertinent History Reviewed:  Reviewed past medical,surgical, social and family history.  Reviewed problem list, medications and allergies. Physical Assessment:   Vitals:   01/15/24 1330  BP: 130/72  Pulse: 67  Weight: 132 lb (59.9 kg)  Height: 5' 0.5" (1.537 m)  Body mass index is 25.36 kg/m.         Physical Examination:   General appearance - well appearing, and in no distress  Mental status - alert, oriented to person, place, and time  Psych:  She has a normal mood and affect  Skin - warm and dry, normal color, no suspicious lesions noted  Chest - effort normal, all lung fields clear to auscultation bilaterally  Heart - normal rate and regular rhythm  Neck:  midline trachea, no thyromegaly or nodules  Breasts - left mastectomy- no abnormalities noted along scar.  No abnormalities noted in right breast.   Abdomen - soft, nontender, nondistended, no masses or organomegaly  Pelvic - VULVA: normal appearing vulva with no masses, tenderness or lesions  Atrophic changes noted.  VAGINA: normal appearing vagina with normal color and discharge, no lesions, narrowed introitus.  CERVIX: normal appearing cervix without discharge or lesions, no CMT  UTERUS: uterus is felt to be normal size, shape, consistency and nontender   ADNEXA: No adnexal masses or tenderness noted.  Extremities:  No swelling or varicosities noted  Chaperone: Latisha Cresenzo     Assessment & Plan:  1) Well-Woman Exam Pap up to date, reviewed screening guidelines   2) Stage 4 breast Ca, medication side effect -followed by oncology -encouraged pt to review options with oncology- which it sounds like  she already has -encouraged pt to seek therapist  3) psoriasis -refill on Rx sent in prn  No orders of the defined types were placed in this encounter.   Meds:  Meds ordered this encounter  Medications   hydrocortisone 2.5 % cream    Sig: Apply topically 2 (two) times daily. Apply thin layer to affected area as  needed    Dispense:  20 g    Refill:  2    Follow-up: Return in about 1 year (around 01/14/2025) for Annual.   Myna Hidalgo, DO Attending Obstetrician & Gynecologist, Faculty Practice Center for [facility], [facility]

## 2024-01-23 ENCOUNTER — Other Ambulatory Visit: Payer: Self-pay

## 2024-01-23 ENCOUNTER — Other Ambulatory Visit (HOSPITAL_COMMUNITY): Payer: Self-pay

## 2024-01-23 ENCOUNTER — Other Ambulatory Visit: Payer: Self-pay | Admitting: Pharmacist

## 2024-01-23 DIAGNOSIS — C7800 Secondary malignant neoplasm of unspecified lung: Secondary | ICD-10-CM

## 2024-01-23 MED ORDER — PALBOCICLIB 75 MG PO TABS
75.0000 mg | ORAL_TABLET | Freq: Every day | ORAL | 6 refills | Status: DC
  Filled 2024-01-23: qty 21, 28d supply, fill #0
  Filled 2024-02-13: qty 21, 28d supply, fill #1
  Filled 2024-03-11: qty 21, 28d supply, fill #2
  Filled 2024-04-04: qty 21, 28d supply, fill #3
  Filled 2024-05-06: qty 21, 28d supply, fill #4
  Filled 2024-06-26: qty 21, 28d supply, fill #5
  Filled 2024-08-06: qty 21, 28d supply, fill #6

## 2024-01-23 NOTE — Progress Notes (Signed)
 Specialty Pharmacy Initial Fill Coordination Note  Monique [name] is a 68 y.o. female contacted today regarding initial fill of specialty medication(s) Palbociclib  (IBRANCE )  Patient requested Delivery   Delivery date: 01/25/24   Verified address: [address] [place], [place] [address]  Medication will be filled on 01/24/24.   Patient is aware of $0.00 copayment. [facility] Secondary.

## 2024-01-23 NOTE — Progress Notes (Signed)
 Patient switching from PAP to filling at [facility] (Specialty)

## 2024-01-23 NOTE — Telephone Encounter (Signed)
 Patient successfully OnBoarded. Medication scheduled to be shipped on Wednesday, 01/24/24, for delivery on Thursday, 01/25/24, from [facility] to patient's address. Patient also knows to call me at [phone] with any questions or concerns regarding receiving medication or if there is any unexpected change in co-pay.    Morene Potters, CPhT Oncology Pharmacy Patient Advocate  [facility]  [phone] (phone) [phone] (fax) 01/23/2024 10:59 AM

## 2024-01-23 NOTE — Progress Notes (Signed)
 Specialty Pharmacy Initiation Note   Monique [name] is a 68 y.o. female who will be followed by the specialty pharmacy service for RxSp Oncology    Review of administration, indication, effectiveness, safety, potential side effects, storage/disposable, and missed dose instructions occurred today for patient's specialty medication(s) Palbociclib  (IBRANCE )     Patient/Caregiver did not have any additional questions or concerns.   Patient's therapy is appropriate to: Continue    Goals Addressed             This Visit's Progress    Slow Disease Progression       Patient is on track. Patient will maintain adherence        Patient switching from PAP to filling at [facility] (Specialty)  Renaee LOISE Ditch Specialty Pharmacist

## 2024-01-24 ENCOUNTER — Encounter (HOSPITAL_COMMUNITY): Payer: Self-pay

## 2024-01-24 ENCOUNTER — Other Ambulatory Visit: Payer: Self-pay

## 2024-01-25 ENCOUNTER — Other Ambulatory Visit (HOSPITAL_COMMUNITY): Payer: Self-pay

## 2024-01-26 ENCOUNTER — Telehealth: Payer: Self-pay

## 2024-01-26 NOTE — Telephone Encounter (Signed)
 Clinical Social Work returned call from patient.  Left voicemail with contact information and request for return call.

## 2024-01-29 ENCOUNTER — Inpatient Hospital Stay: Payer: Medicare Other | Attending: Oncology

## 2024-01-29 NOTE — Progress Notes (Signed)
 [facility] CSW Progress Note  Clinical Child psychotherapist contacted patient by phone to assess needs.  Patient previously worked with [employer], LCSW.  Patient inquired about the [facility] which she is no longer eligible for.  She said she needed assistance with her utilities.  Informed her that the [employer] may be able to help.  Mailed her breast cancer grant applications and lung cancer gas card grant application.  Also mailed her legal aid contact information due to not being eligible for food stamps due to personal issues.    Kennth Peal, LCSW Clinical Social Worker [facility]

## 2024-02-06 ENCOUNTER — Ambulatory Visit: Payer: Medicare Other | Admitting: Family Medicine

## 2024-02-06 ENCOUNTER — Encounter: Payer: Self-pay | Admitting: Family Medicine

## 2024-02-06 VITALS — BP 110/64 | HR 59 | Temp 97.7°F | Ht 60.5 in | Wt 138.0 lb

## 2024-02-06 DIAGNOSIS — Z0000 Encounter for general adult medical examination without abnormal findings: Secondary | ICD-10-CM | POA: Diagnosis not present

## 2024-02-06 DIAGNOSIS — Z0001 Encounter for general adult medical examination with abnormal findings: Secondary | ICD-10-CM

## 2024-02-06 DIAGNOSIS — C50912 Malignant neoplasm of unspecified site of left female breast: Secondary | ICD-10-CM | POA: Diagnosis not present

## 2024-02-06 DIAGNOSIS — E7800 Pure hypercholesterolemia, unspecified: Secondary | ICD-10-CM | POA: Diagnosis not present

## 2024-02-06 DIAGNOSIS — Z853 Personal history of malignant neoplasm of breast: Secondary | ICD-10-CM

## 2024-02-06 DIAGNOSIS — Z13220 Encounter for screening for lipoid disorders: Secondary | ICD-10-CM | POA: Diagnosis not present

## 2024-02-06 NOTE — Progress Notes (Signed)
Subjective:    Patient ID: Monique [name], female    DOB: 1956-08-16, 68 y.o.   MRN: [id number]   02/2023 Unfortunately, since I last saw the patient, the abnormal lesion in her lung has been diagnosed with metastatic breast cancer.  Currently on palliative letrozole and started Ibrance.  Obviously, the patient is dealing with a tremendous amount of stress.  She also has recently lost a family member and has another family member in the hospital.  Therefore she feels overwhelmed at times.  She is reporting trouble sleeping.  Since starting the treatment for breast cancer, she is developing diarrhea and bloating.  Yesterday she took a Valium and her physical symptoms improved and she felt better.  In the past, after her first divorce, she took Prozac for depression and this really helped with stress and anxiety.  She is interested in treatment for anxiety dealing with the situation as listed above.  At that time, my plan was: Patient just got a refill of 30 Valium.  I will be glad to give her up to 60 a month.  Therefore she can take it twice a day if necessary however also recommended starting Lexapro 10 mg a day to try to help overall improve her mood and lower generalized anxiety level.  Also recommended starting a probiotic for the bloating and gas that she is experiencing.  If that does not work she can try Gas-X.  Reassess in 1 month or sooner if worsening  02/06/24 Patient is a 68 year old Caucasian female with a history of metastatic breast cancer.  Overall she is doing extremely well all things considered.  She is currently on Ibrance and seems to be tolerating this without side effects.  Her biggest issue seems to stem from her estrogen blocker which is causing mood swings and fatigue.  However her most recent PET scan shows stability in the right bronchial lesion and no other evidence of any metastatic disease  Overall patient feels well.  She declines any vaccinations today.  We did discuss  the flu shot, the new pneumonia vaccine capvaxive, and the shingles vaccine.  Patient recently had a CBC and a CMP checked at her oncologist.  I reviewed these with her.  Aside from mild pancytopenia, her CMP was normal.  She is due to check her cholesterol today.  Her blood pressure is excellent.  Past Medical History:  Diagnosis Date   Abnormal EKG    Anxiety    Phreesia 11/29/2020   Arthritis    Phreesia 11/29/2020   Barrett's esophagus    Breast cancer (HCC) 2018   Left breast   Cancer (HCC)    Phreesia 11/29/2020   Complication of anesthesia    Family history of breast cancer    Family history of prostate cancer    Family history of thyroid cancer    Fatigue    Generalized headaches    headaches are less since using a mouth piece to prevent grinding of teeth   Hepatitis    as a teenager   IBS (irritable bowel syndrome)    Meniere disease    Osteopenia    Pneumonia    PONV (postoperative nausea and vomiting)    Psoriasis    Tinnitus    left ear   Past Surgical History:  Procedure Laterality Date   BREAST SURGERY N/A    [employer]reesia 11/29/2020   BRONCHIAL BIOPSY  01/17/2023   Procedure: BRONCHIAL BIOPSIES;  Surgeon: Josephine Igo, DO;  Location: [facility]  END[facility];  Service: Pulmonary;;   BRONCHIAL BRUSHINGS  01/17/2023   Procedure: BRONCHIAL BRUSHINGS;  Surgeon: Josephine Igo, DO;  Location: [facility];  Service: Pulmonary;;   DILATION AND CURETTAGE OF UTERUS     FINE NEEDLE ASPIRATION  05/10/2022   Procedure: FINE NEEDLE ASPIRATION (FNA) LINEAR;  Surgeon: Josephine Igo, DO;  Location: [facility];  Service: Pulmonary;;   MASTECTOMY Left 2018   MASTECTOMY W/ SENTINEL NODE BIOPSY Left 08/03/2017   Procedure: LEFT MASTECTOMY WITH LEFT AXILLARY SENTINEL LYMPH NODE BIOPSY;  Surgeon: Ovidio Kin, MD;  Location: [facility];  Service: General;  Laterality: Left;   skin cancer removal     TONSILLECTOMY     VIDEO BRONCHOSCOPY WITH ENDOBRONCHIAL ULTRASOUND N/A  05/10/2022   Procedure: VIDEO BRONCHOSCOPY WITH ENDOBRONCHIAL ULTRASOUND;  Surgeon: Josephine Igo, DO;  Location: [facility];  Service: Pulmonary;  Laterality: N/A;   VIDEO BRONCHOSCOPY WITH ENDOBRONCHIAL ULTRASOUND Right 01/17/2023   Procedure: VIDEO BRONCHOSCOPY WITH ENDOBRONCHIAL ULTRASOUND;  Surgeon: Josephine Igo, DO;  Location: [facility];  Service: Pulmonary;  Laterality: Right;   Current Outpatient Medications on File Prior to Visit  Medication Sig Dispense Refill   anastrozole (ARIMIDEX) 1 MG tablet Take 1 tablet (1 mg total) by mouth daily. 90 tablet 3   diazepam (VALIUM) 10 MG tablet Take 1 tablet (10 mg total) by mouth every 12 (twelve) hours as needed for anxiety. 30 tablet 3   hydrocortisone 2.5 % cream Apply topically 2 (two) times daily. Apply thin layer to affected area as  needed 20 g 2   Multiple Vitamins-Minerals (AIRBORNE PO) Take 1 tablet by mouth daily as needed (immune support).     palbociclib (IBRANCE) 75 MG tablet Take 1 tablet (75 mg total) by mouth daily. Take for 21 days on, 7 days off, repeat every 28 days. 21 tablet 6   Current Facility-Administered Medications on File Prior to Visit  Medication Dose Route Frequency Provider Last Rate Last Admin   0.9 %  sodium chloride infusion  500 mL Intravenous Once Jenel Lucks, MD       Allergies  Allergen Reactions   Chantix [Varenicline]     Hallucination   Codeine Nausea And Vomiting   Wellbutrin [Bupropion] Other (See Comments)    Hallucinations     Social History   Socioeconomic History   Marital status: Married    Spouse name: Not on file   Number of children: Not on file   Years of education: Not on file   Highest education level: Associate degree: occupational, Scientist, product/process development, or vocational program  Occupational History   Not on file  Tobacco Use   Smoking status: Former    Current packs/day: 0.00    Types: Cigarettes    Quit date: 2018    Years since quitting: 7.1    Passive exposure:  Past   Smokeless tobacco: Never  Vaping Use   Vaping status: Never Used  Substance and Sexual Activity   Alcohol use: Yes    Comment: occas   Drug use: Never   Sexual activity: Not Currently    Birth control/protection: Post-menopausal  Other Topics Concern   Not on file  Social History Narrative   Not on file   Social Drivers of Health   Financial Resource Strain: Low Risk  (01/15/2024)   Overall Financial Resource Strain (CARDIA)    Difficulty of Paying Living Expenses: Not hard at all  Recent Concern: Financial Resource Strain - Medium Risk (11/06/2023)  Overall Financial Resource Strain (CARDIA)    Difficulty of Paying Living Expenses: Somewhat hard  Food Insecurity: No Food Insecurity (01/15/2024)   Hunger Vital Sign    Worried About Running Out of Food in the Last Year: Never true    Ran Out of Food in the Last Year: Never true  Recent Concern: Food Insecurity - Food Insecurity Present (11/06/2023)   Hunger Vital Sign    Worried About Running Out of Food in the Last Year: Sometimes true    Ran Out of Food in the Last Year: Never true  Transportation Needs: No Transportation Needs (01/15/2024)   PRAPARE - Administrator, Civil Service (Medical): No    Lack of Transportation (Non-Medical): No  Physical Activity: Insufficiently Active (01/15/2024)   Exercise Vital Sign    Days of Exercise per Week: 3 days    Minutes of Exercise per Session: 30 min  Stress: Stress Concern Present (01/15/2024)   [employer] of Occupational Health - Occupational Stress Questionnaire    Feeling of Stress : To some extent  Social Connections: Moderately Integrated (01/15/2024)   Social Connection and Isolation Panel [NHANES]    Frequency of Communication with Friends and Family: More than three times a week    Frequency of Social Gatherings with Friends and Family: Once a week    Attends Religious Services: 1 to 4 times per year    Active Member of [employer] or Organizations: No     Attends Banker Meetings: Never    Marital Status: Married  Catering manager Violence: Unknown (01/15/2024)   Humiliation, Afraid, Rape, and Kick questionnaire    Fear of Current or Ex-Partner: Patient declined    Emotionally Abused: Patient declined    Physically Abused: No    Sexually Abused: No      Review of Systems  All other systems reviewed and are negative.      Objective:   Physical Exam Vitals reviewed.  Constitutional:      General: She is not in acute distress.    Appearance: She is well-developed. She is not diaphoretic.  HENT:     Head: Normocephalic and atraumatic.  Neck:     Thyroid: No thyromegaly.     Vascular: No JVD.     Trachea: No tracheal deviation.  Cardiovascular:     Rate and Rhythm: Regular rhythm.     Heart sounds: Normal heart sounds. No murmur heard.    No friction rub. No gallop.  Pulmonary:     Effort: Pulmonary effort is normal. No respiratory distress.     Breath sounds: Normal breath sounds. No stridor. No wheezing or rales.  Chest:     Chest wall: No tenderness.  Breasts:    Breasts are asymmetrical.    Neurological:     Mental Status: She is alert.     Motor: No abnormal muscle tone.           Assessment & Plan:  Screening cholesterol level - Plan: Lipid panel  History of breast cancer  Pure hypercholesterolemia  General medical exam Recent CMP was normal.  CBC showed pancytopenia.  Patient recently had a PET scan that shows stability of the right bronchial lesion.  There is no evidence of any metastatic spread.  Patient is following up regularly with oncology.  She declines any vaccinations today including the pneumonia vaccine and shingles vaccine I will check a fasting lipid panel.  Given her situation and the results of her recent PET  scan I see no reason for the patient to have a colonoscopy at this time.

## 2024-02-07 LAB — LIPID PANEL
Cholesterol: 227 mg/dL — ABNORMAL HIGH (ref <200)
HDL: 73 mg/dL (ref >=50)
LDL Cholesterol (Calc): 132 mg/dL — ABNORMAL HIGH
Non-HDL Cholesterol (Calc): 154 mg/dL — ABNORMAL HIGH (ref <130)
Total CHOL/HDL Ratio: 3.1 (calc) (ref <5.0)
Triglycerides: 111 mg/dL (ref <150)

## 2024-02-13 ENCOUNTER — Other Ambulatory Visit: Payer: Self-pay | Admitting: Pharmacy Technician

## 2024-02-13 ENCOUNTER — Other Ambulatory Visit: Payer: Self-pay

## 2024-02-13 NOTE — Progress Notes (Signed)
 Specialty Pharmacy Refill Coordination Note  Monique [name] is a 68 y.o. female contacted today regarding refills of specialty medication(s) Palbociclib Ilda Foil)   Patient requested Delivery   Delivery date: 02/23/24   Verified address: Patient address [address]  [place]   Medication will be filled on 02/22/24.

## 2024-02-16 ENCOUNTER — Other Ambulatory Visit: Payer: Self-pay | Admitting: Family Medicine

## 2024-02-16 ENCOUNTER — Telehealth: Payer: Self-pay | Admitting: Family Medicine

## 2024-02-16 MED ORDER — HYDROCORTISONE ACETATE 25 MG RE SUPP: 25.0000 mg | Freq: Two times a day (BID) | RECTAL | 0 refills | Status: DC

## 2024-02-16 NOTE — Telephone Encounter (Signed)
 Prescription Request  02/16/2024  LOV: 02/06/2024  What is the name of the medication or equipment?   ANUCORT-HC 25 MG SUPPOSITORY **New script requested**  Have you contacted your pharmacy to request a refill? Yes   Which pharmacy would you like this sent to?  [facility] [id number] [place], [place] - [address] [facility] [address] [place] [address] Phone: [phone] Fax: [phone]    Patient notified that their request is being sent to the clinical staff for review and that they should receive a response within 2 business days.   Please advise pharmacist.

## 2024-02-22 ENCOUNTER — Other Ambulatory Visit: Payer: Self-pay

## 2024-02-26 DIAGNOSIS — C50912 Malignant neoplasm of unspecified site of left female breast: Secondary | ICD-10-CM | POA: Diagnosis not present

## 2024-03-07 ENCOUNTER — Other Ambulatory Visit (HOSPITAL_COMMUNITY): Payer: Self-pay

## 2024-03-11 ENCOUNTER — Other Ambulatory Visit: Payer: Self-pay

## 2024-03-11 ENCOUNTER — Other Ambulatory Visit (HOSPITAL_COMMUNITY): Payer: Self-pay

## 2024-03-11 NOTE — Progress Notes (Signed)
 Specialty Pharmacy Refill Coordination Note  Monique [name] is a 68 y.o. female contacted today regarding refills of specialty medication(s) Palbociclib Ilda Foil)   Patient requested Delivery   Delivery date: 03/15/24   Verified address: [address]   [place] [facility] [address]   Medication will be filled on 03/14/24.

## 2024-03-11 NOTE — Progress Notes (Signed)
 Specialty Pharmacy Ongoing Clinical Assessment Note  Monique [name] is a 68 y.o. female who is being followed by the specialty pharmacy service for RxSp Oncology   Patient's specialty medication(s) reviewed today: Palbociclib Ilda Foil)   Missed doses in the last 4 weeks: 0   Patient/Caregiver did not have any additional questions or concerns.   Therapeutic benefit summary: Patient is achieving benefit   Adverse events/side effects summary: Experienced adverse events/side effects (vaginal discharge (MD is aware) and weight gain, patient reports these side effects are more likely from her anastrozole, she is tolerating her Ibrance well at this time)   Patient's therapy is appropriate to: Continue    Goals Addressed             This Visit's Progress    Slow Disease Progression   On track    Patient is on track. Patient will maintain adherence.  Per office visit notes from 01/09/24, PET TEE showed no evidence of recurrent or progressive disease.          Follow up:  3 months  Servando Snare Specialty Pharmacist

## 2024-03-14 ENCOUNTER — Other Ambulatory Visit: Payer: Self-pay

## 2024-04-04 ENCOUNTER — Other Ambulatory Visit: Payer: Self-pay

## 2024-04-04 NOTE — Progress Notes (Signed)
 Specialty Pharmacy Refill Coordination Note  DELORIES MAURI is a 68 y.o. female contacted today regarding refills of specialty medication(s) Palbociclib Anibal Kent)   Patient requested (Patient-Rptd) Delivery   Delivery date: (Patient-Rptd) 04/18/24   Verified address: (Patient-Rptd) [address]         [place] [address]   Medication will be filled on 04.30.25.

## 2024-04-17 ENCOUNTER — Other Ambulatory Visit: Payer: Self-pay

## 2024-04-23 ENCOUNTER — Inpatient Hospital Stay: Payer: Medicare Other | Admitting: Oncology

## 2024-04-23 ENCOUNTER — Inpatient Hospital Stay: Payer: Medicare Other | Attending: Oncology

## 2024-04-23 ENCOUNTER — Encounter: Payer: Self-pay | Admitting: Oncology

## 2024-04-23 VITALS — BP 154/66 | HR 62 | Temp 96.8°F | Resp 18 | Ht 60.5 in | Wt 137.6 lb

## 2024-04-23 DIAGNOSIS — C50912 Malignant neoplasm of unspecified site of left female breast: Secondary | ICD-10-CM | POA: Diagnosis not present

## 2024-04-23 DIAGNOSIS — C50919 Malignant neoplasm of unspecified site of unspecified female breast: Secondary | ICD-10-CM | POA: Diagnosis not present

## 2024-04-23 DIAGNOSIS — Z9012 Acquired absence of left breast and nipple: Secondary | ICD-10-CM | POA: Diagnosis not present

## 2024-04-23 DIAGNOSIS — C7800 Secondary malignant neoplasm of unspecified lung: Secondary | ICD-10-CM | POA: Insufficient documentation

## 2024-04-23 DIAGNOSIS — Z1732 Human epidermal growth factor receptor 2 negative status: Secondary | ICD-10-CM | POA: Insufficient documentation

## 2024-04-23 DIAGNOSIS — Z5181 Encounter for therapeutic drug level monitoring: Secondary | ICD-10-CM

## 2024-04-23 DIAGNOSIS — Z79811 Long term (current) use of aromatase inhibitors: Secondary | ICD-10-CM

## 2024-04-23 DIAGNOSIS — Z1721 Progesterone receptor positive status: Secondary | ICD-10-CM | POA: Diagnosis not present

## 2024-04-23 DIAGNOSIS — Z170 Estrogen receptor positive status [ER+]: Secondary | ICD-10-CM | POA: Insufficient documentation

## 2024-04-23 DIAGNOSIS — Z79899 Other long term (current) drug therapy: Secondary | ICD-10-CM | POA: Diagnosis not present

## 2024-04-23 DIAGNOSIS — D702 Other drug-induced agranulocytosis: Secondary | ICD-10-CM | POA: Diagnosis not present

## 2024-04-23 DIAGNOSIS — Z87891 Personal history of nicotine dependence: Secondary | ICD-10-CM | POA: Diagnosis not present

## 2024-04-23 DIAGNOSIS — M85852 Other specified disorders of bone density and structure, left thigh: Secondary | ICD-10-CM | POA: Diagnosis not present

## 2024-04-23 LAB — CBC WITH DIFFERENTIAL (CANCER CENTER ONLY)
Abs Immature Granulocytes: 0.02 10*3/uL (ref 0.00–0.07)
Basophils Absolute: 0.1 10*3/uL (ref 0.0–0.1)
Basophils Relative: 2 %
Eosinophils Absolute: 0.1 10*3/uL (ref 0.0–0.5)
Eosinophils Relative: 2 %
HCT: 35.3 % — ABNORMAL LOW (ref 36.0–46.0)
Hemoglobin: 11.6 g/dL — ABNORMAL LOW (ref 12.0–15.0)
Immature Granulocytes: 1 %
Lymphocytes Relative: 47 %
Lymphs Abs: 1.4 10*3/uL (ref 0.7–4.0)
MCH: 32.8 pg (ref 26.0–34.0)
MCHC: 32.9 g/dL (ref 30.0–36.0)
MCV: 99.7 fL (ref 80.0–100.0)
Monocytes Absolute: 0.3 10*3/uL (ref 0.1–1.0)
Monocytes Relative: 10 %
Neutro Abs: 1.1 10*3/uL — ABNORMAL LOW (ref 1.7–7.7)
Neutrophils Relative %: 38 %
Platelet Count: 255 10*3/uL (ref 150–400)
RBC: 3.54 MIL/uL — ABNORMAL LOW (ref 3.87–5.11)
RDW: 14.3 % (ref 11.5–15.5)
WBC Count: 2.9 10*3/uL — ABNORMAL LOW (ref 4.0–10.5)
nRBC: 0 % (ref 0.0–0.2)

## 2024-04-23 LAB — CMP (CANCER CENTER ONLY)
ALT: 12 U/L (ref 0–44)
AST: 19 U/L (ref 15–41)
Albumin: 4.5 g/dL (ref 3.5–5.0)
Alkaline Phosphatase: 53 U/L (ref 38–126)
Anion gap: 12 (ref 5–15)
BUN: 9 mg/dL (ref 8–23)
CO2: 21 mmol/L — ABNORMAL LOW (ref 22–32)
Calcium: 8.8 mg/dL — ABNORMAL LOW (ref 8.9–10.3)
Chloride: 101 mmol/L (ref 98–111)
Creatinine: 0.8 mg/dL (ref 0.44–1.00)
GFR, Estimated: >60 mL/min (ref >60)
Glucose, Bld: 101 mg/dL — ABNORMAL HIGH (ref 70–99)
Potassium: 4.1 mmol/L (ref 3.5–5.1)
Sodium: 134 mmol/L — ABNORMAL LOW (ref 135–145)
Total Bilirubin: 0.5 mg/dL (ref 0.0–1.2)
Total Protein: 7.4 g/dL (ref 6.5–8.1)

## 2024-04-23 NOTE — Progress Notes (Signed)
 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  This was ER 100% positive, PR 95% positive HER2 negative Ki-67 15%.  She underwent left mastectomy and did not require any adjuvant radiation therapy.  Oncotype score came back low at 5 and therefore did not require any adjuvant chemotherapy.  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  Patient was initially started on Arimidex  which she could not tolerate due to dizziness and was switched to tamoxifen  which she could also not tolerate and subsequently stopped taking endocrine therapy shortly.  She was seen by both Dr. Lee Public and Dr. Maryalice Smaller at [facility] back in 2018 and was subsequently lost to follow-up   Patient has been seeing Dr. Thelda Finney this for evaluation of bilateral lung nodules and abnormal lung cancer screening CT noted in March 2023.  At that time she had a hypermetabolism with an SUV of 6 in the right hilum.  She had a bronchoscopy done and biopsies of the left bronchoscopy were negative as well as that of the hilum was negative.  There was appearance of a right lower lobe lung nodule  which was being followed given that it was spiculated.  There was a plan for repeat bronchoscopy in November 2023 but Dr. Thelda Finney was sick at that time and patient did not wish to get bronchoscopy by any other pulmonary.  Bronchoscopy was therefore delayed and patient had PET CT scan in January 2024.  PET scan showed intense hypermetabolic activity again in the right hilum with no clear lesion.  Findings concerning for neoplasm obstructing the bronchus with the right middle lobe atelectasis.  Low level of metabolic activity in the right lower lobe.  No other evidence of distant metastatic disease.  Prior to that in October 2023 patient was noted to have bilateral lung nodules varying from 4 to 8 mm in size which could be potentially metastatic.   Had repeat bronchoscopy on 01/17/2023.  Right upper lobe endobronchial lesion brushing was positive for metastatic breast carcinoma.  Tumor cells positive for GATA3 and ER.  Negative for CK5/6, p40, TTF-1, chromogranin, Napsin synaptophysin and CD56.  Patient is consistent with metastatic carcinoma of breast origin. Tumor was 90% ER positive, 85% PR positive and HER2 negative. Patient started on letrozole  in February 2024.  Ibrance  started in March 2024.  Patient had significant arthralgias with letrozole  subsequently and was switched to Arimidex   Interval history-patient has baseline fatigue and feels like her hair is thinning out.  Overall she feels she is tolerating Arimidex  better as compared to other AI's in terms of emotional lability and mood swings.  Also tolerating Ibrance  well  ECOG PS- 1 Pain scale- 0   Review of systems- Review of Systems  Constitutional:  Negative  for chills, fever, malaise/fatigue and weight loss.  HENT:  Negative for congestion, ear discharge and nosebleeds.   Eyes:  Negative for blurred vision.  Respiratory:  Negative for cough, hemoptysis, sputum production, shortness of breath and wheezing.   Cardiovascular:  Negative for chest  pain, palpitations, orthopnea and claudication.  Gastrointestinal:  Negative for abdominal pain, blood in stool, constipation, diarrhea, heartburn, melena, nausea and vomiting.  Genitourinary:  Negative for dysuria, flank pain, frequency, hematuria and urgency.  Musculoskeletal:  Negative for back pain, joint pain and myalgias.  Skin:  Negative for rash.  Neurological:  Negative for dizziness, tingling, focal weakness, seizures, weakness and headaches.  Endo/Heme/Allergies:  Does not bruise/bleed easily.  Psychiatric/Behavioral:  Negative for depression and suicidal ideas. The patient does not have insomnia.       Allergies  Allergen Reactions   Chantix [Varenicline]     Hallucination   Codeine Nausea And Vomiting   Wellbutrin  [Bupropion ] Other (See Comments)    Hallucinations       Past Medical History:  Diagnosis Date   Abnormal EKG    Anxiety    Phreesia 11/29/2020   Arthritis    Phreesia 11/29/2020   Barrett's esophagus    Breast cancer (HCC) 2018   Left breast   Cancer (HCC)    Phreesia 11/29/2020   Complication of anesthesia    Family history of breast cancer    Family history of prostate cancer    Family history of thyroid  cancer    Fatigue    Generalized headaches    headaches are less since using a mouth piece to prevent grinding of teeth   Hepatitis    as a teenager   IBS (irritable bowel syndrome)    Meniere disease    Osteopenia    Pneumonia    PONV (postoperative nausea and vomiting)    Psoriasis    Tinnitus    left ear     Past Surgical History:  Procedure Laterality Date   BREAST SURGERY N/A    Phreesia 11/29/2020   BRONCHIAL BIOPSY  01/17/2023   Procedure: BRONCHIAL BIOPSIES;  Surgeon: Prudy Brownie, DO;  Location: [facility];  Service: Pulmonary;;   BRONCHIAL BRUSHINGS  01/17/2023   Procedure: BRONCHIAL BRUSHINGS;  Surgeon: Prudy Brownie, DO;  Location: [facility];  Service: Pulmonary;;   DILATION AND CURETTAGE OF UTERUS     FINE  NEEDLE ASPIRATION  05/10/2022   Procedure: FINE NEEDLE ASPIRATION (FNA) LINEAR;  Surgeon: Prudy Brownie, DO;  Location: [facility];  Service: Pulmonary;;   MASTECTOMY Left 2018   MASTECTOMY W/ SENTINEL NODE BIOPSY Left 08/03/2017   Procedure: LEFT MASTECTOMY WITH LEFT AXILLARY SENTINEL LYMPH NODE BIOPSY;  Surgeon: Juanita Norlander, MD;  Location: [facility];  Service: General;  Laterality: Left;   skin cancer removal     TONSILLECTOMY     VIDEO BRONCHOSCOPY WITH ENDOBRONCHIAL ULTRASOUND N/A 05/10/2022   Procedure: VIDEO BRONCHOSCOPY WITH ENDOBRONCHIAL ULTRASOUND;  Surgeon: Prudy Brownie, DO;  Location: [facility];  Service: Pulmonary;  Laterality: N/A;   VIDEO BRONCHOSCOPY WITH ENDOBRONCHIAL ULTRASOUND Right 01/17/2023   Procedure: VIDEO BRONCHOSCOPY WITH ENDOBRONCHIAL ULTRASOUND;  Surgeon: Prudy Brownie, DO;  Location: [facility];  Service: Pulmonary;  Laterality: Right;    Social History   Socioeconomic History   Marital status: Married    Spouse name: Not on file   Number of children: Not on file   Years of education: Not on file   Highest education level:  Associate degree: occupational, technical, or vocational program  Occupational History   Not on file  Tobacco Use   Smoking status: Former    Current packs/day: 0.00    Types: Cigarettes    Quit date: 2018    Years since quitting: 7.3    Passive exposure: Past   Smokeless tobacco: Never  Vaping Use   Vaping status: Never Used  Substance and Sexual Activity   Alcohol use: Yes    Comment: occas   Drug use: Never   Sexual activity: Not Currently    Birth control/protection: Post-menopausal  Other Topics Concern   Not on file  Social History Narrative   Not on file   Social Drivers of Health   Financial Resource Strain: Low Risk  (01/15/2024)   Overall Financial Resource Strain (CARDIA)    Difficulty of Paying Living Expenses: Not hard at all  Recent Concern: Financial Resource Strain - Medium  Risk (11/06/2023)   Overall Financial Resource Strain (CARDIA)    Difficulty of Paying Living Expenses: Somewhat hard  Food Insecurity: No Food Insecurity (01/15/2024)   Hunger Vital Sign    Worried About Running Out of Food in the Last Year: Never true    Ran Out of Food in the Last Year: Never true  Recent Concern: Food Insecurity - Food Insecurity Present (11/06/2023)   Hunger Vital Sign    Worried About Running Out of Food in the Last Year: Sometimes true    Ran Out of Food in the Last Year: Never true  Transportation Needs: No Transportation Needs (01/15/2024)   PRAPARE - Administrator, Civil Service (Medical): No    Lack of Transportation (Non-Medical): No  Physical Activity: Insufficiently Active (01/15/2024)   Exercise Vital Sign    Days of Exercise per Week: 3 days    Minutes of Exercise per Session: 30 min  Stress: Stress Concern Present (01/15/2024)   [employer] of Occupational Health - Occupational Stress Questionnaire    Feeling of Stress : To some extent  Social Connections: Moderately Integrated (01/15/2024)   Social Connection and Isolation Panel [NHANES]    Frequency of Communication with Friends and Family: More than three times a week    Frequency of Social Gatherings with Friends and Family: Once a week    Attends Religious Services: 1 to 4 times per year    Active Member of [employer] or Organizations: No    Attends Banker Meetings: Never    Marital Status: Married  Catering manager Violence: Unknown (01/15/2024)   Humiliation, Afraid, Rape, and Kick questionnaire    Fear of Current or Ex-Partner: Patient declined    Emotionally Abused: Patient declined    Physically Abused: No    Sexually Abused: No    Family History  Problem Relation Age of Onset   Hypertension Mother    Heart attack Father    Hypertension Father    Healthy Maternal Aunt    Healthy Maternal Uncle    Breast cancer Paternal Aunt 75       bilateral   Healthy  Paternal Aunt    Prostate cancer Paternal Uncle    Thyroid  cancer Paternal Uncle    Healthy Paternal Uncle    Breast cancer Cousin 18       paternal first cousin   Breast cancer Cousin        paternal first cousin   Colon cancer Neg Hx    Esophageal cancer Neg Hx    Rectal cancer  Neg Hx    Stomach cancer Neg Hx      Current Outpatient Medications:    hydrocortisone  (ANUSOL -HC) 25 MG suppository, Place 1 suppository (25 mg total) rectally 2 (two) times daily., Disp: 12 suppository, Rfl: 0   anastrozole  (ARIMIDEX ) 1 MG tablet, Take 1 tablet (1 mg total) by mouth daily., Disp: 90 tablet, Rfl: 3   diazepam  (VALIUM ) 10 MG tablet, Take 1 tablet (10 mg total) by mouth every 12 (twelve) hours as needed for anxiety., Disp: 30 tablet, Rfl: 3   hydrocortisone  2.5 % cream, Apply topically 2 (two) times daily. Apply thin layer to affected area as  needed, Disp: 20 g, Rfl: 2   Multiple Vitamins-Minerals (AIRBORNE PO), Take 1 tablet by mouth daily as needed (immune support)., Disp: , Rfl:    palbociclib  (IBRANCE ) 75 MG tablet, Take 1 tablet (75 mg total) by mouth daily. Take for 21 days on, 7 days off, repeat every 28 days., Disp: 21 tablet, Rfl: 6  Current Facility-Administered Medications:    0.9 %  sodium chloride  infusion, 500 mL, Intravenous, Once, Elois Hair, MD  Physical exam:  Vitals:   04/23/24 0958  BP: (!) 154/66  Pulse: 62  Resp: 18  Temp: (!) 96.8 F (36 C)  TempSrc: Tympanic  SpO2: 100%  Weight: 137 lb 9.6 oz (62.4 kg)  Height: 5' 0.5" (1.537 m)   Physical Exam Cardiovascular:     Rate and Rhythm: Normal rate and regular rhythm.     Heart sounds: Normal heart sounds.  Pulmonary:     Effort: Pulmonary effort is normal.     Breath sounds: Normal breath sounds.  Skin:    General: Skin is warm and dry.  Neurological:     Mental Status: She is alert and oriented to person, place, and time.      I have personally reviewed labs listed below:    Latest Ref Rng &  Units 04/23/2024    9:26 AM  CMP  Glucose 70 - 99 mg/dL 161   BUN 8 - 23 mg/dL 9   Creatinine 0.96 - 0.45 mg/dL 4.09   Sodium 811 - 914 mmol/L 134   Potassium 3.5 - 5.1 mmol/L 4.1   Chloride 98 - 111 mmol/L 101   CO2 22 - 32 mmol/L 21   Calcium  8.9 - 10.3 mg/dL 8.8   Total Protein 6.5 - 8.1 g/dL 7.4   Total Bilirubin 0.0 - 1.2 mg/dL 0.5   Alkaline Phos 38 - 126 U/L 53   AST 15 - 41 U/L 19   ALT 0 - 44 U/L 12       Latest Ref Rng & Units 04/23/2024    9:26 AM  CBC  WBC 4.0 - 10.5 K/uL 2.9   Hemoglobin 12.0 - 15.0 g/dL 78.2   Hematocrit 95.6 - 46.0 % 35.3   Platelets 150 - 400 K/uL 255    I have personally reviewed Radiology images listed below: No images are attached to the encounter.  No results found.   Assessment and plan- Patient is a 68 y.o. female with history of metastatic ER/PR positive HER2 negative breast cancer with isolated intrabronchial lung metastases here for a routine follow-up next time   Patient will continue with Ibrance  and Arimidex  for medication at this time.  If she has stable disease over the next 3 to 6 months we could consider dropping Ibrance  and continuing with Arimidex  alone.  For now patient would like to continue with combination.  Neutrophils  remain more than 1.  She will also take calcium  and vitamin D.Her baseline bone density scan in March 2024 showed osteopenia of the left femur neck with a T-score of -2.3.  10-year probability of major osteoporotic fracture was less than 20% and hip fracture less than 3%.  Plan to repeat bone density scan next year.  I will see her back in 3 months with CBC CMP CA 27-29.  She will need a PET scan prior to her visit with me   Visit Diagnosis 1. High risk medication use   2. Malignant neoplasm of breast metastatic to lung (HCC)   3. Visit for monitoring Arimidex  therapy   4. Drug-induced neutropenia (HCC)      Dr. Seretha Dance, MD, MPH [facility] at [facility] [phone] 04/23/2024 12:39  PM

## 2024-04-23 NOTE — Progress Notes (Signed)
 Patient reports she's very tired, she also is complaining of having swallowing issues at times. She also states she's doing well estrogen blockers.

## 2024-04-24 LAB — CA 27.29 (SERIAL MONITOR): CA 27.29: 23.6 U/mL (ref 0.0–38.6)

## 2024-04-30 ENCOUNTER — Other Ambulatory Visit (HOSPITAL_COMMUNITY): Payer: Self-pay

## 2024-05-01 ENCOUNTER — Other Ambulatory Visit (HOSPITAL_COMMUNITY): Payer: Self-pay

## 2024-05-01 ENCOUNTER — Other Ambulatory Visit: Payer: Self-pay

## 2024-05-01 MED ORDER — EXEMESTANE 25 MG PO TABS
25.0000 mg | ORAL_TABLET | Freq: Every day | ORAL | 3 refills | Status: DC
  Filled 2024-05-01: qty 30, 30d supply, fill #0

## 2024-05-01 MED ORDER — ATORVASTATIN CALCIUM 40 MG PO TABS
40.0000 mg | ORAL_TABLET | Freq: Every day | ORAL | 3 refills | Status: DC
Start: 2023-12-07 — End: 2024-05-02
  Filled 2024-05-01: qty 90, 90d supply, fill #0

## 2024-05-01 MED ORDER — ESCITALOPRAM OXALATE 10 MG PO TABS
10.0000 mg | ORAL_TABLET | Freq: Every day | ORAL | 1 refills | Status: AC
  Filled 2024-05-01: qty 90, 90d supply, fill #0

## 2024-05-02 ENCOUNTER — Other Ambulatory Visit (HOSPITAL_COMMUNITY): Payer: Self-pay

## 2024-05-02 ENCOUNTER — Other Ambulatory Visit: Payer: Self-pay

## 2024-05-06 ENCOUNTER — Other Ambulatory Visit: Payer: Self-pay

## 2024-05-06 ENCOUNTER — Other Ambulatory Visit: Payer: Self-pay | Admitting: Pharmacy Technician

## 2024-05-06 NOTE — Progress Notes (Signed)
 Specialty Pharmacy Refill Coordination Note  [name] is a 68 y.o. female contacted today regarding refills of specialty medication(s) Palbociclib  (IBRANCE )   Patient requested Delivery   Delivery date: 06/05/24   Verified address: [address] [place] [address]   Medication will be filled on 06/04/24.

## 2024-05-17 ENCOUNTER — Other Ambulatory Visit: Payer: Self-pay | Admitting: Family Medicine

## 2024-05-17 ENCOUNTER — Other Ambulatory Visit (HOSPITAL_COMMUNITY): Payer: Self-pay

## 2024-05-17 ENCOUNTER — Other Ambulatory Visit: Payer: Self-pay

## 2024-05-17 NOTE — Telephone Encounter (Signed)
 Copied from CRM [id number]. Topic: Clinical - Medication Refill >> May 17, 2024  1:22 PM Sophia H wrote: Medication: diazepam  (VALIUM ) 10 MG tablet **Pt states Dr. Cheril Cork told her he would increase her dosage but she is unsure. Okay with keeping dosage just needs filled   Has the patient contacted their pharmacy? Yes, unable to transfer script to [facility]  (Agent: If no, request that the patient contact the pharmacy for the refill. If patient does not wish to contact the pharmacy document the reason why and proceed with request.) (Agent: If yes, when and what did the pharmacy advise?)  This is the patient's preferred pharmacy:  [facility] [address] [place] [address] Phone: [phone] Fax: [phone]  Is this the correct pharmacy for this prescription? Yes If no, delete pharmacy and type the correct one.   Has the prescription been filled recently? No, December 2024  Is the patient out of the medication? Yes  Has the patient been seen for an appointment in the last year OR does the patient have an upcoming appointment? Yes  Can we respond through MyChart? Yes  Agent: Please be advised that Rx refills may take up to 3 business days. We ask that you follow-up with your pharmacy.

## 2024-05-20 ENCOUNTER — Encounter: Payer: Self-pay | Admitting: Family Medicine

## 2024-05-20 NOTE — Telephone Encounter (Signed)
 Requested medication (s) are due for refill today: yes  Requested medication (s) are on the active medication list: yes  Last refill:  11/27/23  Future visit scheduled: no  Notes to clinic:  Unable to refill per protocol, cannot delegate.      Requested Prescriptions  Pending Prescriptions Disp Refills   diazepam  (VALIUM ) 10 MG tablet 30 tablet 3    Sig: Take 1 tablet (10 mg total) by mouth every 12 (twelve) hours as needed for anxiety.     Not Delegated - Psychiatry: Anxiolytics/Hypnotics 2 Failed - 05/20/2024  9:40 AM      Failed - This refill cannot be delegated      Failed - Urine Drug Screen completed in last 360 days      Passed - Patient is not pregnant      Passed - Valid encounter within last 6 months    Recent Outpatient Visits           3 months ago Screening cholesterol level   [place] [facility] Pickard, Cisco Crest, MD   6 months ago History of breast cancer   [place] [facility] Austine Lefort, MD   1 year ago Tick bite of right knee, initial encounter   [place] [facility] Jenelle Mis, FNP   1 year ago Situational stress   [place] [facility] Cheril Cork, Cisco Crest, MD   1 year ago Elevated blood sugar   [place] [facility] Pickard, Cisco Crest, MD

## 2024-05-21 ENCOUNTER — Other Ambulatory Visit (HOSPITAL_COMMUNITY): Payer: Self-pay

## 2024-05-21 ENCOUNTER — Encounter: Payer: Self-pay | Admitting: Oncology

## 2024-05-21 ENCOUNTER — Other Ambulatory Visit: Payer: Self-pay

## 2024-05-21 MED ORDER — DIAZEPAM 10 MG PO TABS
10.0000 mg | ORAL_TABLET | Freq: Two times a day (BID) | ORAL | 3 refills | Status: DC | PRN
  Filled 2024-05-21 (×2): qty 30, 15d supply, fill #0
  Filled 2024-07-09: qty 30, 15d supply, fill #1
  Filled 2024-08-05: qty 30, 15d supply, fill #2
  Filled 2024-10-04 – 2024-10-08 (×2): qty 30, 15d supply, fill #3

## 2024-05-22 ENCOUNTER — Telehealth: Payer: Self-pay | Admitting: *Deleted

## 2024-05-22 NOTE — Telephone Encounter (Signed)
 Endo called from [facility] in [place] about Monique [name] locally needing a clearance letter for an extraction of a tooth.  Telephone number [phone], the fax number for the letter [phone].

## 2024-05-22 NOTE — Telephone Encounter (Signed)
She can proceed

## 2024-05-22 NOTE — Telephone Encounter (Signed)
 Ok to proceed.

## 2024-05-30 ENCOUNTER — Other Ambulatory Visit: Payer: Self-pay

## 2024-05-30 NOTE — Progress Notes (Signed)
 Specialty Pharmacy Ongoing Clinical Assessment Note  Monique [name] is a 68 y.o. female who is being followed by the specialty pharmacy service for RxSp Oncology   Patient's specialty medication(s) reviewed today: Palbociclib  (IBRANCE )   Missed doses in the last 4 weeks: 0   Patient/Caregiver did not have any additional questions or concerns.   Therapeutic benefit summary: Patient is achieving benefit   Adverse events/side effects summary: Experienced adverse events/side effects (weakness about 3 days prior to end of ibrance , but otherwise tolerates the medication well and has no issues)   Patient's therapy is appropriate to: Continue    Goals Addressed             This Visit's Progress    Slow Disease Progression   On track    Patient is on track. Patient will maintain adherence and be monitored by provider to determine if a change in treatment plan is warranted.  Per office visit notes from 04/23/24, disease is stable. If disease continues to be stable for next 3-6 months, considering stopping Ibrance  and continuing on anastrozole  alone. Plan to have PET scan prior to next office visit in August         Follow up: 3 months  [facility] Specialty Pharmacist

## 2024-06-03 ENCOUNTER — Other Ambulatory Visit: Payer: Self-pay

## 2024-06-11 ENCOUNTER — Encounter (HOSPITAL_COMMUNITY): Payer: Self-pay

## 2024-06-25 ENCOUNTER — Other Ambulatory Visit (HOSPITAL_COMMUNITY): Payer: Self-pay

## 2024-06-26 ENCOUNTER — Other Ambulatory Visit: Payer: Self-pay

## 2024-06-26 ENCOUNTER — Encounter (INDEPENDENT_AMBULATORY_CARE_PROVIDER_SITE_OTHER): Payer: Self-pay

## 2024-06-26 ENCOUNTER — Other Ambulatory Visit: Payer: Self-pay | Admitting: Pharmacy Technician

## 2024-06-26 NOTE — Progress Notes (Signed)
 Specialty Pharmacy Refill Coordination Note  Monique [name] is a 68 y.o. female contacted today regarding refills of specialty medication(s) Palbociclib  (IBRANCE )   Patient requested (Patient-Rptd) Delivery   Delivery date: 07/19/2024 Verified address: (Patient-Rptd) [address]   [place] [address]   Medication will be filled on 07/18/24.

## 2024-06-27 ENCOUNTER — Other Ambulatory Visit (HOSPITAL_COMMUNITY): Payer: Self-pay

## 2024-07-09 ENCOUNTER — Other Ambulatory Visit: Payer: Self-pay

## 2024-07-09 ENCOUNTER — Other Ambulatory Visit (HOSPITAL_COMMUNITY): Payer: Self-pay

## 2024-07-12 ENCOUNTER — Ambulatory Visit
Admission: RE | Admit: 2024-07-12 | Discharge: 2024-07-12 | Disposition: A | Discharge status: Home / Self Care | Source: Ambulatory Visit | Attending: Oncology | Admitting: Oncology

## 2024-07-12 DIAGNOSIS — R918 Other nonspecific abnormal finding of lung field: Secondary | ICD-10-CM

## 2024-07-12 DIAGNOSIS — R911 Solitary pulmonary nodule: Secondary | ICD-10-CM | POA: Diagnosis not present

## 2024-07-12 DIAGNOSIS — C7800 Secondary malignant neoplasm of unspecified lung: Secondary | ICD-10-CM | POA: Insufficient documentation

## 2024-07-12 DIAGNOSIS — Z5181 Encounter for therapeutic drug level monitoring: Secondary | ICD-10-CM | POA: Diagnosis not present

## 2024-07-12 DIAGNOSIS — Z9012 Acquired absence of left breast and nipple: Secondary | ICD-10-CM | POA: Diagnosis not present

## 2024-07-12 DIAGNOSIS — Z79811 Long term (current) use of aromatase inhibitors: Secondary | ICD-10-CM | POA: Insufficient documentation

## 2024-07-12 DIAGNOSIS — N281 Cyst of kidney, acquired: Secondary | ICD-10-CM | POA: Insufficient documentation

## 2024-07-12 DIAGNOSIS — I2510 Atherosclerotic heart disease of native coronary artery without angina pectoris: Secondary | ICD-10-CM | POA: Insufficient documentation

## 2024-07-12 DIAGNOSIS — C50919 Malignant neoplasm of unspecified site of unspecified female breast: Secondary | ICD-10-CM | POA: Insufficient documentation

## 2024-07-12 DIAGNOSIS — Z170 Estrogen receptor positive status [ER+]: Secondary | ICD-10-CM

## 2024-07-12 LAB — GLUCOSE, CAPILLARY: Glucose-Capillary: 99 mg/dL (ref 70–99)

## 2024-07-12 MED ORDER — FLUDEOXYGLUCOSE F - 18 (FDG) INJECTION
7.5100 | Freq: Once | INTRAVENOUS | Status: AC | PRN
  Administered 2024-07-12: 7.51 via INTRAVENOUS

## 2024-07-15 ENCOUNTER — Other Ambulatory Visit: Payer: Self-pay

## 2024-07-17 ENCOUNTER — Other Ambulatory Visit: Payer: Self-pay

## 2024-07-30 ENCOUNTER — Inpatient Hospital Stay: Admitting: Oncology

## 2024-07-30 ENCOUNTER — Inpatient Hospital Stay: Attending: Oncology

## 2024-07-30 ENCOUNTER — Encounter: Payer: Self-pay | Admitting: Oncology

## 2024-07-30 VITALS — BP 134/55 | HR 65 | Temp 97.7°F | Resp 19 | Wt 137.3 lb

## 2024-07-30 DIAGNOSIS — Z170 Estrogen receptor positive status [ER+]: Secondary | ICD-10-CM | POA: Diagnosis not present

## 2024-07-30 DIAGNOSIS — Z5181 Encounter for therapeutic drug level monitoring: Secondary | ICD-10-CM | POA: Diagnosis not present

## 2024-07-30 DIAGNOSIS — C50912 Malignant neoplasm of unspecified site of left female breast: Secondary | ICD-10-CM | POA: Insufficient documentation

## 2024-07-30 DIAGNOSIS — Z1732 Human epidermal growth factor receptor 2 negative status: Secondary | ICD-10-CM | POA: Insufficient documentation

## 2024-07-30 DIAGNOSIS — D702 Other drug-induced agranulocytosis: Secondary | ICD-10-CM | POA: Diagnosis not present

## 2024-07-30 DIAGNOSIS — Z79899 Other long term (current) drug therapy: Secondary | ICD-10-CM | POA: Diagnosis not present

## 2024-07-30 DIAGNOSIS — C50919 Malignant neoplasm of unspecified site of unspecified female breast: Secondary | ICD-10-CM

## 2024-07-30 DIAGNOSIS — Z79811 Long term (current) use of aromatase inhibitors: Secondary | ICD-10-CM | POA: Insufficient documentation

## 2024-07-30 DIAGNOSIS — Z1721 Progesterone receptor positive status: Secondary | ICD-10-CM | POA: Diagnosis not present

## 2024-07-30 DIAGNOSIS — Z87891 Personal history of nicotine dependence: Secondary | ICD-10-CM | POA: Diagnosis not present

## 2024-07-30 DIAGNOSIS — C7800 Secondary malignant neoplasm of unspecified lung: Secondary | ICD-10-CM | POA: Diagnosis not present

## 2024-07-30 DIAGNOSIS — Z9012 Acquired absence of left breast and nipple: Secondary | ICD-10-CM | POA: Insufficient documentation

## 2024-07-30 LAB — CBC WITH DIFFERENTIAL (CANCER CENTER ONLY)
Abs Immature Granulocytes: 0.02 K/uL (ref 0.00–0.07)
Basophils Absolute: 0.1 K/uL (ref 0.0–0.1)
Basophils Relative: 2 %
Eosinophils Absolute: 0.1 K/uL (ref 0.0–0.5)
Eosinophils Relative: 4 %
HCT: 36.3 % (ref 36.0–46.0)
Hemoglobin: 12.1 g/dL (ref 12.0–15.0)
Immature Granulocytes: 1 %
Lymphocytes Relative: 43 %
Lymphs Abs: 1 K/uL (ref 0.7–4.0)
MCH: 32.6 pg (ref 26.0–34.0)
MCHC: 33.3 g/dL (ref 30.0–36.0)
MCV: 97.8 fL (ref 80.0–100.0)
Monocytes Absolute: 0.1 K/uL (ref 0.1–1.0)
Monocytes Relative: 5 %
Neutro Abs: 1.1 K/uL — ABNORMAL LOW (ref 1.7–7.7)
Neutrophils Relative %: 45 %
Platelet Count: 405 K/uL — ABNORMAL HIGH (ref 150–400)
RBC: 3.71 MIL/uL — ABNORMAL LOW (ref 3.87–5.11)
RDW: 13.5 % (ref 11.5–15.5)
Smear Review: NORMAL
WBC Count: 2.4 K/uL — ABNORMAL LOW (ref 4.0–10.5)
nRBC: 0 % (ref 0.0–0.2)

## 2024-07-30 LAB — CMP (CANCER CENTER ONLY)
ALT: 12 U/L (ref 0–44)
AST: 20 U/L (ref 15–41)
Albumin: 4.2 g/dL (ref 3.5–5.0)
Alkaline Phosphatase: 55 U/L (ref 38–126)
Anion gap: 9 (ref 5–15)
BUN: 12 mg/dL (ref 8–23)
CO2: 24 mmol/L (ref 22–32)
Calcium: 9.1 mg/dL (ref 8.9–10.3)
Chloride: 102 mmol/L (ref 98–111)
Creatinine: 0.99 mg/dL (ref 0.44–1.00)
GFR, Estimated: >60 mL/min (ref >60)
Glucose, Bld: 126 mg/dL — ABNORMAL HIGH (ref 70–99)
Potassium: 3.9 mmol/L (ref 3.5–5.1)
Sodium: 135 mmol/L (ref 135–145)
Total Bilirubin: 0.7 mg/dL (ref 0.0–1.2)
Total Protein: 7.3 g/dL (ref 6.5–8.1)

## 2024-07-30 NOTE — Progress Notes (Signed)
 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  This was ER 100% positive, PR 95% positive HER2 negative Ki-67 15%.  She underwent left mastectomy and did not require any adjuvant radiation therapy.  Oncotype score came back low at 5 and therefore did not require any adjuvant chemotherapy.  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  Patient was initially started on Arimidex  which she could not tolerate due to dizziness and was switched to tamoxifen  which she could also not tolerate and subsequently stopped taking endocrine therapy shortly.  She was seen by both Dr. Odean and Dr. Lanny at [facility] back in 2018 and was subsequently lost to follow-up   Patient has been seeing Dr. Brenna this for evaluation of bilateral lung nodules and abnormal lung cancer screening CT noted in March 2023.  At that time she had a hypermetabolism with an SUV of 6 in the right hilum.  She had a bronchoscopy done and biopsies of the left bronchoscopy were negative as well as that of the hilum was negative.  There was appearance of a right lower lobe lung nodule  which was being followed given that it was spiculated.  There was a plan for repeat bronchoscopy in November 2023 but Dr. Brenna was sick at that time and patient did not wish to get bronchoscopy by any other pulmonary.  Bronchoscopy was therefore delayed and patient had PET CT scan in January 2024.  PET scan showed intense hypermetabolic activity again in the right hilum with no clear lesion.  Findings concerning for neoplasm obstructing the bronchus with the right middle lobe atelectasis.  Low level of metabolic activity in the right lower lobe.  No other evidence of distant metastatic disease.  Prior to that in October 2023 patient was noted to have bilateral lung nodules varying from 4 to 8 mm in size which could be potentially metastatic.   Had repeat bronchoscopy on 01/17/2023.  Right upper lobe endobronchial lesion brushing was positive for metastatic breast carcinoma.  Tumor cells positive for GATA3 and ER.  Negative for CK5/6, p40, TTF-1, chromogranin, Napsin synaptophysin and CD56.  Patient is consistent with metastatic carcinoma of breast origin. Tumor was 90% ER positive, 85% PR positive and HER2 negative. Patient started on letrozole  in February 2024.  Ibrance  started in March 2024.  Patient had significant arthralgias with letrozole  subsequently and was switched to Arimidex  Follow-up  Interval history-patient has been having some rectal pain over the last couple of weeks.  Denies any blood in her stool.  Occasional constipation alternating with diarrhea.  Tolerating Arimidex  pills Ibrance  well so far.   ECOG PS- 1 Pain scale- 0   Review of systems- Review of Systems  Constitutional:  Positive for malaise/fatigue. Negative  for chills, fever and weight loss.  HENT:  Negative for congestion, ear discharge and nosebleeds.   Eyes:  Negative for blurred vision.  Respiratory:  Negative for cough, hemoptysis, sputum production, shortness of breath and wheezing.   Cardiovascular:  Negative for  chest pain, palpitations, orthopnea and claudication.  Gastrointestinal:  Negative for abdominal pain, blood in stool, constipation, diarrhea, heartburn, melena, nausea and vomiting.       Rectal pain  Genitourinary:  Negative for dysuria, flank pain, frequency, hematuria and urgency.  Musculoskeletal:  Negative for back pain, joint pain and myalgias.  Skin:  Negative for rash.  Neurological:  Negative for dizziness, tingling, focal weakness, seizures, weakness and headaches.  Endo/Heme/Allergies:  Does not bruise/bleed easily.  Psychiatric/Behavioral:  Negative for depression and suicidal ideas. The patient does not have insomnia.       Allergies  Allergen Reactions   Chantix [Varenicline]     Hallucination   Codeine Nausea And Vomiting   Wellbutrin  Alyn.Ates ] Other (See Comments)    Hallucinations       Past Medical History:  Diagnosis Date   Abnormal EKG    Anxiety    Phreesia 11/29/2020   Arthritis    Phreesia 11/29/2020   Barrett's esophagus    Breast cancer (HCC) 2018   Left breast   Cancer (HCC)    Phreesia 11/29/2020   Complication of anesthesia    Family history of breast cancer    Family history of prostate cancer    Family history of thyroid  cancer    Fatigue    Generalized headaches    headaches are less since using a mouth piece to prevent grinding of teeth   Hepatitis    as a teenager   IBS (irritable bowel syndrome)    Meniere disease    Osteopenia    Pneumonia    PONV (postoperative nausea and vomiting)    Psoriasis    Tinnitus    left ear     Past Surgical History:  Procedure Laterality Date   BREAST SURGERY N/A    Phreesia 11/29/2020   BRONCHIAL BIOPSY  01/17/2023   Procedure: BRONCHIAL BIOPSIES;  Surgeon: Brenna Adine CROME, DO;  Location: [facility];  Service: Pulmonary;;   BRONCHIAL BRUSHINGS  01/17/2023   Procedure: BRONCHIAL BRUSHINGS;  Surgeon: Brenna Adine CROME, DO;  Location: [facility];  Service: Pulmonary;;   DILATION AND  CURETTAGE OF UTERUS     FINE NEEDLE ASPIRATION  05/10/2022   Procedure: FINE NEEDLE ASPIRATION (FNA) LINEAR;  Surgeon: Brenna Adine CROME, DO;  Location: [facility];  Service: Pulmonary;;   MASTECTOMY Left 2018   MASTECTOMY W/ SENTINEL NODE BIOPSY Left 08/03/2017   Procedure: LEFT MASTECTOMY WITH LEFT AXILLARY SENTINEL LYMPH NODE BIOPSY;  Surgeon: Ethyl Lenis, MD;  Location: [facility];  Service: General;  Laterality: Left;   skin cancer removal     TONSILLECTOMY     VIDEO BRONCHOSCOPY WITH ENDOBRONCHIAL ULTRASOUND N/A 05/10/2022   Procedure: VIDEO BRONCHOSCOPY WITH ENDOBRONCHIAL ULTRASOUND;  Surgeon: Brenna Adine CROME, DO;  Location: [facility];  Service: Pulmonary;  Laterality: N/A;   VIDEO BRONCHOSCOPY WITH ENDOBRONCHIAL ULTRASOUND Right 01/17/2023   Procedure: VIDEO BRONCHOSCOPY WITH ENDOBRONCHIAL ULTRASOUND;  Surgeon: Brenna Adine CROME, DO;  Location: [facility];  Service: Pulmonary;  Laterality: Right;    Social History   Socioeconomic History   Marital status: Married    Spouse name: Not on file   Number of children: Not on file   Years of education: Not  on file   Highest education level: Associate degree: occupational, Scientist, product/process development, or vocational program  Occupational History   Not on file  Tobacco Use   Smoking status: Former    Current packs/day: 0.00    Types: Cigarettes    Quit date: 2018    Years since quitting: 7.6    Passive exposure: Past   Smokeless tobacco: Never  Vaping Use   Vaping status: Never Used  Substance and Sexual Activity   Alcohol use: Yes    Comment: occas   Drug use: Never   Sexual activity: Not Currently    Birth control/protection: Post-menopausal  Other Topics Concern   Not on file  Social History Narrative   Not on file   Social Drivers of Health   Financial Resource Strain: Low Risk  (01/15/2024)   Overall Financial Resource Strain (CARDIA)    Difficulty of Paying Living Expenses: Not hard at all  Recent Concern:  Financial Resource Strain - Medium Risk (11/06/2023)   Overall Financial Resource Strain (CARDIA)    Difficulty of Paying Living Expenses: Somewhat hard  Food Insecurity: No Food Insecurity (01/15/2024)   Hunger Vital Sign    Worried About Running Out of Food in the Last Year: Never true    Ran Out of Food in the Last Year: Never true  Recent Concern: Food Insecurity - Food Insecurity Present (11/06/2023)   Hunger Vital Sign    Worried About Running Out of Food in the Last Year: Sometimes true    Ran Out of Food in the Last Year: Never true  Transportation Needs: No Transportation Needs (01/15/2024)   PRAPARE - Administrator, Civil Service (Medical): No    Lack of Transportation (Non-Medical): No  Physical Activity: Insufficiently Active (01/15/2024)   Exercise Vital Sign    Days of Exercise per Week: 3 days    Minutes of Exercise per Session: 30 min  Stress: Stress Concern Present (01/15/2024)   [employer] of Occupational Health - Occupational Stress Questionnaire    Feeling of Stress : To some extent  Social Connections: Moderately Integrated (01/15/2024)   Social Connection and Isolation Panel    Frequency of Communication with Friends and Family: More than three times a week    Frequency of Social Gatherings with Friends and Family: Once a week    Attends Religious Services: 1 to 4 times per year    Active Member of [employer] or Organizations: No    Attends Banker Meetings: Never    Marital Status: Married  Catering manager Violence: Unknown (01/15/2024)   Humiliation, Afraid, Rape, and Kick questionnaire    Fear of Current or Ex-Partner: Patient declined    Emotionally Abused: Patient declined    Physically Abused: No    Sexually Abused: No    Family History  Problem Relation Age of Onset   Hypertension Mother    Heart attack Father    Hypertension Father    Healthy Maternal Aunt    Healthy Maternal Uncle    Breast cancer Paternal Aunt 41        bilateral   Healthy Paternal Aunt    Prostate cancer Paternal Uncle    Thyroid  cancer Paternal Uncle    Healthy Paternal Uncle    Breast cancer Cousin 25       paternal first cousin   Breast cancer Cousin        paternal first cousin   Colon cancer Neg Hx    Esophageal cancer Neg  Hx    Rectal cancer Neg Hx    Stomach cancer Neg Hx      Current Outpatient Medications:    anastrozole  (ARIMIDEX ) 1 MG tablet, Take 1 tablet (1 mg total) by mouth daily., Disp: 90 tablet, Rfl: 3   diazepam  (VALIUM ) 10 MG tablet, Take 1 tablet (10 mg total) by mouth every 12 (twelve) hours as needed for anxiety., Disp: 30 tablet, Rfl: 3   Multiple Vitamins-Minerals (AIRBORNE PO), Take 1 tablet by mouth daily as needed (immune support)., Disp: , Rfl:    palbociclib  (IBRANCE ) 75 MG tablet, Take 1 tablet (75 mg total) by mouth daily. Take for 21 days on, 7 days off, repeat every 28 days., Disp: 21 tablet, Rfl: 6   escitalopram  (LEXAPRO ) 10 MG tablet, Take 1 tablet (10 mg total) by mouth daily. (Patient not taking: Reported on 07/30/2024), Disp: 90 tablet, Rfl: 1   hydrocortisone  (ANUSOL -HC) 25 MG suppository, Place 1 suppository (25 mg total) rectally 2 (two) times daily. (Patient not taking: Reported on 07/30/2024), Disp: 12 suppository, Rfl: 0   hydrocortisone  2.5 % cream, Apply topically as a thin layer twice daily to affected area as needed. (Patient not taking: Reported on 07/30/2024), Disp: 30 g, Rfl: 2  Current Facility-Administered Medications:    0.9 %  sodium chloride  infusion, 500 mL, Intravenous, Once, Stacia Glendia BRAVO, MD  Physical exam:  Vitals:   07/30/24 1029  BP: (!) 134/55  Pulse: 65  Resp: 19  Temp: 97.7 F (36.5 C)  SpO2: 99%  Weight: 137 lb 4.8 oz (62.3 kg)   Physical Exam Cardiovascular:     Rate and Rhythm: Normal rate and regular rhythm.     Heart sounds: Normal heart sounds.  Pulmonary:     Effort: Pulmonary effort is normal.     Breath sounds: Normal breath sounds.   Abdominal:     General: Bowel sounds are normal.     Palpations: Abdomen is soft.  Skin:    General: Skin is warm and dry.  Neurological:     Mental Status: She is alert and oriented to person, place, and time.      I have personally reviewed labs listed below:    Latest Ref Rng & Units 07/30/2024   10:12 AM  CMP  Glucose 70 - 99 mg/dL 873   BUN 8 - 23 mg/dL 12   Creatinine 9.55 - 1.00 mg/dL 9.00   Sodium 864 - 854 mmol/L 135   Potassium 3.5 - 5.1 mmol/L 3.9   Chloride 98 - 111 mmol/L 102   CO2 22 - 32 mmol/L 24   Calcium  8.9 - 10.3 mg/dL 9.1   Total Protein 6.5 - 8.1 g/dL 7.3   Total Bilirubin 0.0 - 1.2 mg/dL 0.7   Alkaline Phos 38 - 126 U/L 55   AST 15 - 41 U/L 20   ALT 0 - 44 U/L 12       Latest Ref Rng & Units 07/30/2024   10:12 AM  CBC  WBC 4.0 - 10.5 K/uL 2.4   Hemoglobin 12.0 - 15.0 g/dL 87.8   Hematocrit 63.9 - 46.0 % 36.3   Platelets 150 - 400 K/uL 405    I have personally reviewed Radiology images listed below: No images are attached to the encounter.  NM PET Image Restag (PS) Skull Base To Thigh Result Date: 07/19/2024 CLINICAL DATA:  Subsequent treatment strategy for breast cancer. EXAM: NUCLEAR MEDICINE PET SKULL BASE TO THIGH TECHNIQUE: 7.51 mCi F-18 FDG was  injected intravenously. Full-ring PET imaging was performed from the skull base to thigh after the radiotracer. CT data was obtained and used for attenuation correction and anatomic localization. Fasting blood glucose: 99 mg/dl COMPARISON:  PET-CT 98/92/7974 FINDINGS: Mediastinal blood pool activity: SUV max 2.1 Liver activity: SUV max 2.9 NECK: No specific abnormal uptake identified in the neck including along lymph node change of the submandibular, posterior triangle or internal jugular region. Near symmetric uptake of the visualized intracranial compartment. Incidental CT findings: The parotid glands, submandibular glands are preserved. Small thyroid  gland is stable. Streak artifact related to the  patient's dental hardware. Paranasal sinuses and mastoid air cells are clear. CHEST: There is a focal area of uptake along the inferior portion of the right upper lobe bronchus previously measured at maximum SUV of 5.2 and today 5.2, unchanged. Otherwise there is no specific abnormal uptake seen above blood pool in the axillary regions, hilum or mediastinum. Minimal uptake along a juxtapleural nodular at the right lung apex maximum SUV of 1.6. This area is likely unchanged from previous and on image 37 of series 6 measures 7 by 4 mm. Otherwise no abnormal lung uptake. Incidental CT findings: Stable cardiac contour. Trace pericardial fluid or thickening. Coronary artery calcifications are seen. The thoracic aorta is normal course and caliber scattered vascular calcifications. There is some linear opacity lung bases likely scar or atelectasis. Volume loss again seen in the middle lobe and inferior anterior right upper lobe. Stable right apical pleural thickening. Emphysematous lung changes identified. Breathing motion ABDOMEN/PELVIS: No abnormal hypermetabolic activity within the liver, pancreas, adrenal glands, or spleen. No hypermetabolic lymph nodes in the abdomen or pelvis. Incidental CT findings: On this limited noncontrast, attenuation correction CT, the liver, spleen, adrenal glands and pancreas are unremarkable. Gallbladder is nondilated. Normal caliber aorta and IVC with some scattered vascular calcifications. Bosniak 1 right-sided posterior renal cyst. No definite renal or ureteral stones. Contracted urinary bladder. Uterus is present. Large bowel has a normal course and caliber with some stool as well as some air-fluid levels. Nonspecific. Normal appendix. Mild fluid and debris in the nondilated stomach. SKELETON: No specific abnormal uptake along the visualized osseous structures. Incidental CT findings: Scattered degenerative changes. Previous left mastectomy. IMPRESSION: Stable small area of uptake along  the right hilum, central right upper lobe bronchus. Small area of nodular at the right lung apex could be nodular pleural thickening with some minimal uptake. Simple attention on follow-up. Slightly more prominent today. No additional areas of abnormal radiotracer uptake at this time Electronically Signed   By: Ranell Bring M.D.   On: 07/19/2024 16:20     Assessment and plan- Patient is a 68 y.o. female with history of metastatic ER/PR positive HER2 negative breast cancer with isolated intrabronchial lung metastases.  She is currently on Arimidex  plus Ibrance  and here for a routine follow-up visit  I have reviewed PET CT scan i images from July 2025 independently and discussed findings with the patient which shows stable uptake in the right upper lobe and intrabronchial region where she had biopsy-proven metastatic disease.  No areas of progressive disease.  Plan is to continue with Arimidex  plus Ibrance  at this time.  She will be due for repeat bone density scan in March 2026.  She has mild Ibrance  induced neutropenia but ANC remains more than 1.  Continue to monitor.  Rectal pain: Patient is going to try sitz bath and see how her symptoms do.  If they worsen she will let us  know.  Her last colonoscopy was 2022 which showed a 3 mm sessile polyp in the distal rectum and hypertrophied anal papula.  Repeat colonoscopy was recommended in 10 years.  If her symptoms worsen I will refer her to GI for endoscopy evaluation   Visit Diagnosis 1. 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 Visit for monitoring Arimidex  therapy   3. High risk medication use   4. Drug-induced neutropenia (HCC)      Dr. Annah Skene, MD, MPH [facility] [phone] 07/30/2024 11:31 AM

## 2024-07-31 LAB — CA 27.29 (SERIAL MONITOR): CA 27.29: 21.4 U/mL (ref 0.0–38.6)

## 2024-08-05 ENCOUNTER — Encounter (INDEPENDENT_AMBULATORY_CARE_PROVIDER_SITE_OTHER): Payer: Self-pay

## 2024-08-05 ENCOUNTER — Other Ambulatory Visit (HOSPITAL_COMMUNITY): Payer: Self-pay

## 2024-08-05 ENCOUNTER — Other Ambulatory Visit: Payer: Self-pay

## 2024-08-06 ENCOUNTER — Other Ambulatory Visit: Payer: Self-pay

## 2024-08-06 NOTE — Progress Notes (Signed)
 Specialty Pharmacy Refill Coordination Note  Monique [name] is a 68 y.o. female contacted today regarding refills of specialty medication(s) Palbociclib  (IBRANCE )   Patient requested (Patient-Rptd) Delivery   Delivery date: 08/20/24   Verified address: (Patient-Rptd) [address] [place] [address]   Medication will be filled on 09.01.25.

## 2024-08-08 ENCOUNTER — Ambulatory Visit: Payer: Medicare Other

## 2024-08-12 ENCOUNTER — Other Ambulatory Visit: Payer: Self-pay

## 2024-08-12 NOTE — Progress Notes (Signed)
 Patient was left a voice mail that due to the upcoming Labor day holiday, medication will be rescheduled to fill on 08/20/24 and will be delivered on 08/21/24

## 2024-08-12 NOTE — Progress Notes (Signed)
 Specialty Pharmacy Ongoing Clinical Assessment Note  Monique [name] is a 68 y.o. female who is being followed by the specialty pharmacy service for RxSp Oncology   Patient's specialty medication(s) reviewed today: No data recorded  Missed doses in the last 4 weeks: 0   Patient/Caregiver did not have any additional questions or concerns.   Therapeutic benefit summary: Patient is achieving benefit   Adverse events/side effects summary: No adverse events/side effects   Patient's therapy is appropriate to: Continue    Goals Addressed             This Visit's Progress    Slow Disease Progression   On track    Patient is on track. Patient will maintain adherence and be monitored by provider to determine if a change in treatment plan is warranted.  Per office visit notes from 07/30/24, scan from July shows stable uptake and no areas of progressive disease.         Follow up: 3 months  [facility]

## 2024-08-20 ENCOUNTER — Other Ambulatory Visit: Payer: Self-pay

## 2024-08-22 ENCOUNTER — Ambulatory Visit: Admitting: *Deleted

## 2024-08-22 VITALS — BP 111/72 | HR 84 | Temp 98.0°F | Ht 61.0 in | Wt 137.0 lb

## 2024-08-22 DIAGNOSIS — Z0000 Encounter for general adult medical examination without abnormal findings: Secondary | ICD-10-CM

## 2024-08-22 NOTE — Patient Instructions (Signed)
 Monique [name] , Thank you for taking time to come for your Medicare Wellness Visit. I appreciate your ongoing commitment to your health goals. Please review the following plan we discussed and let me know if I can assist you in the future.   Screening recommendations/referrals: Colonoscopy:  Mammogram:  Bone Density:  Recommended yearly ophthalmology/optometry visit for glaucoma screening and checkup Recommended yearly dental visit for hygiene and checkup  Vaccinations: Influenza vaccine: Pneumococcal vaccine:  Tdap vaccine:  Shingles vaccine:       Preventive Care 65 Years and Older, Female Preventive care refers to lifestyle choices and visits with your health care provider that can promote health and wellness. What does preventive care include? A yearly physical exam. This is also called an annual well check. Dental exams once or twice a year. Routine eye exams. Ask your health care provider how often you should have your eyes checked. Personal lifestyle choices, including: Daily care of your teeth and gums. Regular physical activity. Eating a healthy diet. Avoiding tobacco and drug use. Limiting alcohol use. Practicing safe sex. Taking low-dose aspirin  every day. Taking vitamin and mineral supplements as recommended by your health care provider. What happens during an annual well check? The services and screenings done by your health care provider during your annual well check will depend on your age, overall health, lifestyle risk factors, and family history of disease. Counseling  Your health care provider may ask you questions about your: Alcohol use. Tobacco use. Drug use. Emotional well-being. Home and relationship well-being. Sexual activity. Eating habits. History of falls. Memory and ability to understand (cognition). Work and work Astronomer. Reproductive health. Screening  You may have the following tests or measurements: Height, weight, and BMI. Blood  pressure. Lipid and cholesterol levels. These may be checked every 5 years, or more frequently if you are over 57 years old. Skin check. Lung cancer screening. You may have this screening every year starting at age 78 if you have a 30-pack-year history of smoking and currently smoke or have quit within the past 15 years. Fecal occult blood test (FOBT) of the stool. You may have this test every year starting at age 79. Flexible sigmoidoscopy or colonoscopy. You may have a sigmoidoscopy every 5 years or a colonoscopy every 10 years starting at age 90. Hepatitis C blood test. Hepatitis B blood test. Sexually transmitted disease (STD) testing. Diabetes screening. This is done by checking your blood sugar (glucose) after you have not eaten for a while (fasting). You may have this done every 1-3 years. Bone density scan. This is done to screen for osteoporosis. You may have this done starting at age 17. Mammogram. This may be done every 1-2 years. Talk to your health care provider about how often you should have regular mammograms. Talk with your health care provider about your test results, treatment options, and if necessary, the need for more tests. Vaccines  Your health care provider may recommend certain vaccines, such as: Influenza vaccine. This is recommended every year. Tetanus, diphtheria, and acellular pertussis (Tdap, Td) vaccine. You may need a Td booster every 10 years. Zoster vaccine. You may need this after age 58. Pneumococcal 13-valent conjugate (PCV13) vaccine. One dose is recommended after age 74. Pneumococcal polysaccharide (PPSV23) vaccine. One dose is recommended after age 56. Talk to your health care provider about which screenings and vaccines you need and how often you need them. This information is not intended to replace advice given to you by your health care provider.  Make sure you discuss any questions you have with your health care provider. Document Released:  01/01/2016 Document Revised: 08/24/2016 Document Reviewed: 10/06/2015 Elsevier Interactive Patient Education  2017 [employer].  Fall Prevention in the Home Falls can cause injuries. They can happen to people of all ages. There are many things you can do to make your home safe and to help prevent falls. What can I do on the outside of my home? Regularly fix the edges of walkways and driveways and fix any cracks. Remove anything that might make you trip as you walk through a door, such as a raised step or threshold. Trim any bushes or trees on the path to your home. Use bright outdoor lighting. Clear any walking paths of anything that might make someone trip, such as rocks or tools. Regularly check to see if handrails are loose or broken. Make sure that both sides of any steps have handrails. Any raised decks and porches should have guardrails on the edges. Have any leaves, snow, or ice cleared regularly. Use sand or salt on walking paths during winter. Clean up any spills in your garage right away. This includes oil or grease spills. What can I do in the bathroom? Use night lights. Install grab bars by the toilet and in the tub and shower. Do not use towel bars as grab bars. Use non-skid mats or decals in the tub or shower. If you need to sit down in the shower, use a plastic, non-slip stool. Keep the floor dry. Clean up any water that spills on the floor as soon as it happens. Remove soap buildup in the tub or shower regularly. Attach bath mats securely with double-sided non-slip rug tape. Do not have throw rugs and other things on the floor that can make you trip. What can I do in the bedroom? Use night lights. Make sure that you have a light by your bed that is easy to reach. Do not use any sheets or blankets that are too big for your bed. They should not hang down onto the floor. Have a firm chair that has side arms. You can use this for support while you get dressed. Do not have  throw rugs and other things on the floor that can make you trip. What can I do in the kitchen? Clean up any spills right away. Avoid walking on wet floors. Keep items that you use a lot in easy-to-reach places. If you need to reach something above you, use a strong step stool that has a grab bar. Keep electrical cords out of the way. Do not use floor polish or wax that makes floors slippery. If you must use wax, use non-skid floor wax. Do not have throw rugs and other things on the floor that can make you trip. What can I do with my stairs? Do not leave any items on the stairs. Make sure that there are handrails on both sides of the stairs and use them. Fix handrails that are broken or loose. Make sure that handrails are as long as the stairways. Check any carpeting to make sure that it is firmly attached to the stairs. Fix any carpet that is loose or worn. Avoid having throw rugs at the top or bottom of the stairs. If you do have throw rugs, attach them to the floor with carpet tape. Make sure that you have a light switch at the top of the stairs and the bottom of the stairs. If you do not have them,  ask someone to add them for you. What else can I do to help prevent falls? Wear shoes that: Do not have high heels. Have rubber bottoms. Are comfortable and fit you well. Are closed at the toe. Do not wear sandals. If you use a stepladder: Make sure that it is fully opened. Do not climb a closed stepladder. Make sure that both sides of the stepladder are locked into place. Ask someone to hold it for you, if possible. Clearly mark and make sure that you can see: Any grab bars or handrails. First and last steps. Where the edge of each step is. Use tools that help you move around (mobility aids) if they are needed. These include: Canes. Walkers. Scooters. Crutches. Turn on the lights when you go into a dark area. Replace any light bulbs as soon as they burn out. Set up your furniture so  you have a clear path. Avoid moving your furniture around. If any of your floors are uneven, fix them. If there are any pets around you, be aware of where they are. Review your medicines with your doctor. Some medicines can make you feel dizzy. This can increase your chance of falling. Ask your doctor what other things that you can do to help prevent falls. This information is not intended to replace advice given to you by your health care provider. Make sure you discuss any questions you have with your health care provider. Document Released: 10/01/2009 Document Revised: 05/12/2016 Document Reviewed: 01/09/2015 Elsevier Interactive Patient Education  2017 [employer].

## 2024-08-22 NOTE — Progress Notes (Signed)
 Subjective:   Monique [name] is a 68 y.o. female who presents for Medicare Annual (Subsequent) preventive examination.  Visit Complete: Virtual I connected with  Shona JINNY Hacking on 08/22/24 by a audio enabled telemedicine application and verified that I am speaking with the correct person using two identifiers.  Patient Location: Home  Provider Location: Home Office  I discussed the limitations of evaluation and management by telemedicine. The patient expressed understanding and agreed to proceed.  Vital Signs: Because this visit was a virtual/telehealth visit, some criteria may be missing or patient reported. Any vitals not documented were not able to be obtained and vitals that have been documented are patient reported.   Cardiac Risk Factors include: advanced age (>25men, >73 women);obesity (BMI >30kg/m2)     Objective:    Today's Vitals   08/22/24 1024  Weight: 137 lb (62.1 kg)  Height: 5' 1 (1.549 m)   Body mass index is 25.89 kg/m.     08/22/2024   10:25 AM 07/30/2024   10:33 AM 04/23/2024    9:54 AM 01/09/2024   10:45 AM 01/09/2024   10:44 AM 09/05/2023   11:14 AM 08/03/2023   12:40 PM  Advanced Directives  Does Patient Have a Medical Advance Directive? Yes Yes Yes Yes No Yes Yes  Type of Estate agent of [employer] Power of [place];Living will Healthcare Power of [place];Living will Healthcare Power of [place];Living will  Healthcare Power of [place];Living will Healthcare Power of [place];Living will  Does patient want to make changes to medical advance directive?  Yes (ED - Information included in AVS)    Yes (Inpatient - patient defers changing a medical advance directive at this time - Information given) No - Patient declined  Copy of Healthcare Power of Attorney in Chart? Yes - validated most recent copy scanned in chart (See row information)  No - copy requested No - copy requested  Yes - validated most recent copy scanned in chart  (See row information) Yes - validated most recent copy scanned in chart (See row information)  Would patient like information on creating a medical advance directive?  Yes (ED - Information included in AVS)   No - Patient declined      Current Medications (verified) Outpatient Encounter Medications as of 08/22/2024  Medication Sig   anastrozole  (ARIMIDEX ) 1 MG tablet Take 1 tablet (1 mg total) by mouth daily.   diazepam  (VALIUM ) 10 MG tablet Take 1 tablet (10 mg total) by mouth every 12 (twelve) hours as needed for anxiety.   Multiple Vitamins-Minerals (AIRBORNE PO) Take 1 tablet by mouth daily as needed (immune support).   palbociclib  (IBRANCE ) 75 MG tablet Take 1 tablet (75 mg total) by mouth daily. Take for 21 days on, 7 days off, repeat every 28 days.   escitalopram  (LEXAPRO ) 10 MG tablet Take 1 tablet (10 mg total) by mouth daily. (Patient not taking: Reported on 08/22/2024)   hydrocortisone  (ANUSOL -HC) 25 MG suppository Place 1 suppository (25 mg total) rectally 2 (two) times daily. (Patient not taking: Reported on 08/22/2024)   hydrocortisone  2.5 % cream Apply topically as a thin layer twice daily to affected area as needed. (Patient not taking: Reported on 08/22/2024)   Facility-Administered Encounter Medications as of 08/22/2024  Medication   0.9 %  sodium chloride  infusion    Allergies (verified) Chantix [varenicline], Codeine, and Wellbutrin  [bupropion ]   History: Past Medical History:  Diagnosis Date   Abnormal EKG    Anxiety  Phreesia 11/29/2020   Arthritis    Phreesia 11/29/2020   Barrett's esophagus    Breast cancer (HCC) 2018   Left breast   Cancer (HCC)    Phreesia 11/29/2020   Complication of anesthesia    Family history of breast cancer    Family history of prostate cancer    Family history of thyroid  cancer    Fatigue    Generalized headaches    headaches are less since using a mouth piece to prevent grinding of teeth   Hepatitis    as a teenager   IBS  (irritable bowel syndrome)    Meniere disease    Osteopenia    Pneumonia    PONV (postoperative nausea and vomiting)    Psoriasis    Tinnitus    left ear   Past Surgical History:  Procedure Laterality Date   BREAST SURGERY N/A    Phreesia 11/29/2020   BRONCHIAL BIOPSY  01/17/2023   Procedure: BRONCHIAL BIOPSIES;  Surgeon: Brenna Adine CROME, DO;  Location: [facility];  Service: Pulmonary;;   BRONCHIAL BRUSHINGS  01/17/2023   Procedure: BRONCHIAL BRUSHINGS;  Surgeon: Brenna Adine CROME, DO;  Location: [facility];  Service: Pulmonary;;   DILATION AND CURETTAGE OF UTERUS     FINE NEEDLE ASPIRATION  05/10/2022   Procedure: FINE NEEDLE ASPIRATION (FNA) LINEAR;  Surgeon: Brenna Adine CROME, DO;  Location: [facility];  Service: Pulmonary;;   MASTECTOMY Left 2018   MASTECTOMY W/ SENTINEL NODE BIOPSY Left 08/03/2017   Procedure: LEFT MASTECTOMY WITH LEFT AXILLARY SENTINEL LYMPH NODE BIOPSY;  Surgeon: Ethyl Lenis, MD;  Location: [facility];  Service: General;  Laterality: Left;   skin cancer removal     TONSILLECTOMY     VIDEO BRONCHOSCOPY WITH ENDOBRONCHIAL ULTRASOUND N/A 05/10/2022   Procedure: VIDEO BRONCHOSCOPY WITH ENDOBRONCHIAL ULTRASOUND;  Surgeon: Brenna Adine CROME, DO;  Location: [facility];  Service: Pulmonary;  Laterality: N/A;   VIDEO BRONCHOSCOPY WITH ENDOBRONCHIAL ULTRASOUND Right 01/17/2023   Procedure: VIDEO BRONCHOSCOPY WITH ENDOBRONCHIAL ULTRASOUND;  Surgeon: Brenna Adine CROME, DO;  Location: [facility];  Service: Pulmonary;  Laterality: Right;   Family History  Problem Relation Age of Onset   Hypertension Mother    Heart attack Father    Hypertension Father    Healthy Maternal Aunt    Healthy Maternal Uncle    Breast cancer Paternal Aunt 69       bilateral   Healthy Paternal Aunt    Prostate cancer Paternal Uncle    Thyroid  cancer Paternal Uncle    Healthy Paternal Uncle    Breast cancer Cousin 15       paternal first cousin   Breast cancer Cousin         paternal first cousin   Colon cancer Neg Hx    Esophageal cancer Neg Hx    Rectal cancer Neg Hx    Stomach cancer Neg Hx    Social History   Socioeconomic History   Marital status: Married    Spouse name: Not on file   Number of children: Not on file   Years of education: Not on file   Highest education level: Associate degree: occupational, Scientist, product/process development, or vocational program  Occupational History   Not on file  Tobacco Use   Smoking status: Former    Current packs/day: 0.00    Types: Cigarettes    Quit date: 2018    Years since quitting: 7.6    Passive exposure: Past   Smokeless tobacco: Never  Vaping Use   Vaping status: Never Used  Substance and Sexual Activity   Alcohol use: Yes    Comment: occas   Drug use: Never   Sexual activity: Not Currently    Birth control/protection: Post-menopausal  Other Topics Concern   Not on file  Social History Narrative   Not on file   Social Drivers of Health   Financial Resource Strain: Medium Risk (08/22/2024)   Overall Financial Resource Strain (CARDIA)    Difficulty of Paying Living Expenses: Somewhat hard  Food Insecurity: No Food Insecurity (08/22/2024)   Hunger Vital Sign    Worried About Running Out of Food in the Last Year: Never true    Ran Out of Food in the Last Year: Never true  Recent Concern: Food Insecurity - Food Insecurity Present (08/16/2024)   Hunger Vital Sign    Worried About Running Out of Food in the Last Year: Never true    Ran Out of Food in the Last Year: Sometimes true  Transportation Needs: No Transportation Needs (08/22/2024)   PRAPARE - Administrator, Civil Service (Medical): No    Lack of Transportation (Non-Medical): No  Physical Activity: Sufficiently Active (08/22/2024)   Exercise Vital Sign    Days of Exercise per Week: 5 days    Minutes of Exercise per Session: 60 min  Stress: Stress Concern Present (08/22/2024)   [employer] of Occupational Health - Occupational Stress  Questionnaire    Feeling of Stress: Very much  Social Connections: Moderately Integrated (08/22/2024)   Social Connection and Isolation Panel    Frequency of Communication with Friends and Family: More than three times a week    Frequency of Social Gatherings with Friends and Family: Once a week    Attends Religious Services: 1 to 4 times per year    Active Member of [employer] or Organizations: No    Attends Engineer, structural: Never    Marital Status: Married    Tobacco Counseling Counseling given: Not Answered   Clinical Intake:  Pre-visit preparation completed: Yes  Pain : No/denies pain     Diabetes: No  How often do you need to have someone help you when you read instructions, pamphlets, or other written materials from your doctor or pharmacy?: 1 - Never  Interpreter Needed?: No  Information entered by :: Mliss Graff LPN   Activities of Daily Living    08/22/2024   10:28 AM  In your present state of health, do you have any difficulty performing the following activities:  Hearing? 0  Vision? 0  Difficulty concentrating or making decisions? 0  Walking or climbing stairs? 1  Dressing or bathing? 0  Doing errands, shopping? 0  Preparing Food and eating ? N  Using the Toilet? N    Patient Care Team: Duanne Butler DASEN, MD as PCP - General (Family Medicine) Lonni Slain, MD as PCP - Cardiology (Cardiology) Marilynn Nest, DO as Consulting Physician (Obstetrics and Gynecology) Ethyl Lenis, MD as Consulting Physician (General Surgery) Dewey Rush, MD as Consulting Physician (Radiation Oncology) Melanee Annah BROCKS, MD as Consulting Physician (Oncology)  Indicate any recent Medical Services you may have received from other than [facility] providers in the past year (date may be approximate).     Assessment:   This is a routine wellness examination for [place].  Hearing/Vision screen Hearing Screening - Comments:: No trouble hearing Vision Screening -  Comments:: Every other year Looking for a new one.    Goals Addressed  This Visit's Progress    Patient Stated       Stay healthy     Remain active and independent   On track      Depression Screen    08/22/2024   10:30 AM 01/15/2024    1:29 PM 11/09/2023   12:25 PM 08/03/2023   12:37 PM 01/27/2023    2:21 PM 12/02/2022    8:30 AM 10/07/2022   11:15 AM  PHQ 2/9 Scores  PHQ - 2 Score 3 4 0 2 2 2 1   PHQ- 9 Score 6 15  5  6      Fall Risk    08/22/2024   10:26 AM 11/09/2023   12:24 PM 08/03/2023   12:38 PM 08/02/2023   11:00 PM 12/02/2022    8:30 AM  Fall Risk   Falls in the past year? 1 0 0 0 0  Number falls in past yr: 0 0 0  0  Injury with Fall? 0 0 0  0  Risk for fall due to :  No Fall Risks No Fall Risks  No Fall Risks  Follow up Falls evaluation completed;Education provided;Falls prevention discussed Falls prevention discussed Falls prevention discussed;Education provided;Falls evaluation completed  Falls prevention discussed      Data saved with a previous flowsheet row definition    MEDICARE RISK AT HOME: Medicare Risk at Home Any stairs in or around the home?: No If so, are there any without handrails?: No Home free of loose throw rugs in walkways, pet beds, electrical cords, etc?: Yes Adequate lighting in your home to reduce risk of falls?: Yes Life alert?: No Use of a cane, walker or w/c?: No Grab bars in the bathroom?: Yes Shower chair or bench in shower?: Yes Elevated toilet seat or a handicapped toilet?: Yes  TIMED UP AND GO:  Was the test performed?  No    Cognitive Function:        08/22/2024   10:28 AM 08/03/2023   12:40 PM  6CIT Screen  What Year? 0 points 0 points  What month? 0 points 0 points  What time? 0 points 0 points  Count back from 20 0 points 0 points  Months in reverse 0 points 0 points  Repeat phrase 0 points 0 points  Total Score 0 points 0 points    Immunizations Immunization History  Administered Date(s)  Administered   Fluzone Influenza virus vaccine,trivalent (IIV3), split virus 10/15/2010   Influenza-Unspecified 10/15/2010, 10/19/2018   Tdap 10/14/2010    TDAP status: Due, Education has been provided regarding the importance of this vaccine. Advised may receive this vaccine at local pharmacy or Health Dept. Aware to provide a copy of the vaccination record if obtained from local pharmacy or Health Dept. Verbalized acceptance and understanding.  Flu Vaccine status: Due, Education has been provided regarding the importance of this vaccine. Advised may receive this vaccine at local pharmacy or Health Dept. Aware to provide a copy of the vaccination record if obtained from local pharmacy or Health Dept. Verbalized acceptance and understanding.  Pneumococcal vaccine status: Due, Education has been provided regarding the importance of this vaccine. Advised may receive this vaccine at local pharmacy or Health Dept. Aware to provide a copy of the vaccination record if obtained from local pharmacy or Health Dept. Verbalized acceptance and understanding.  Covid-19 vaccine status: Declined, Education has been provided regarding the importance of this vaccine but patient still declined. Advised may receive this vaccine at local pharmacy or Health Dept.or  vaccine clinic. Aware to provide a copy of the vaccination record if obtained from local pharmacy or Health Dept. Verbalized acceptance and understanding.  Qualifies for Shingles Vaccine? Yes   Zostavax completed No   Shingrix Completed?: No.    Education has been provided regarding the importance of this vaccine. Patient has been advised to call insurance company to determine out of pocket expense if they have not yet received this vaccine. Advised may also receive vaccine at local pharmacy or Health Dept. Verbalized acceptance and understanding.  Screening Tests Health Maintenance  Topic Date Due   Pneumococcal Vaccine: 50+ Years (1 of 2 - PCV) Never  done   INFLUENZA VACCINE  03/18/2025 (Originally 07/19/2024)   MAMMOGRAM  02/22/2025   Medicare Annual Wellness (AWV)  08/22/2025   Colonoscopy  09/15/2031   DEXA SCAN  Completed   Hepatitis C Screening  Completed   HPV VACCINES  Aged Out   Meningococcal B Vaccine  Aged Out   DTaP/Tdap/Td  Discontinued   COVID-19 Vaccine  Discontinued   Zoster Vaccines- Shingrix  Discontinued    Health Maintenance  Health Maintenance Due  Topic Date Due   Pneumococcal Vaccine: 50+ Years (1 of 2 - PCV) Never done    Colorectal cancer screening: Type of screening: Colonoscopy. Completed 2022. Repeat every 10 years  Mammogram  patient oncologist advised due to pet scans does not need  Bone Density status: Completed 2024. Results reflect: Bone density results: OSTEOPENIA. Repeat every 2 years.  Lung Cancer Screening: (Low Dose CT Chest recommended if Age 64-80 years, 20 pack-year currently smoking OR have quit w/in 15years.) does not qualify.   Lung Cancer Screening Referral:   Additional Screening:  Hepatitis C Screening: does not qualify; Completed 2019  Vision Screening: Recommended annual ophthalmology exams for early detection of glaucoma and other disorders of the eye. Is the patient up to date with their annual eye exam?  No  Who is the provider or what is the name of the office in which the patient attends annual eye exams? Looking for a new one If pt is not established with a provider, would they like to be referred to a provider to establish care? No .   Dental Screening: Recommended annual dental exams for proper oral hygiene    Community Resource Referral / Chronic Care Management: CRR required this visit?  No   CCM required this visit?  No     Plan:     I have personally reviewed and noted the following in the patient's chart:   Medical and social history Use of alcohol, tobacco or illicit drugs  Current medications and supplements including opioid prescriptions.  Patient is not currently taking opioid prescriptions. Functional ability and status Nutritional status Physical activity Advanced directives List of other physicians Hospitalizations, surgeries, and ER visits in previous 12 months Vitals Screenings to include cognitive, depression, and falls Referrals and appointments  In addition, I have reviewed and discussed with patient certain preventive protocols, quality metrics, and best practice recommendations. A written personalized care plan for preventive services as well as general preventive health recommendations were provided to patient.     Mliss Graff, LPN   [id number]   After Visit Summary: (MyChart) Due to this being a telephonic visit, the after visit summary with patients personalized plan was offered to patient via MyChart   Nurse Notes:

## 2024-09-05 ENCOUNTER — Ambulatory Visit: Admitting: Family Medicine

## 2024-09-05 ENCOUNTER — Other Ambulatory Visit: Payer: Self-pay

## 2024-09-05 ENCOUNTER — Encounter: Payer: Self-pay | Admitting: Family Medicine

## 2024-09-05 ENCOUNTER — Other Ambulatory Visit (HOSPITAL_COMMUNITY): Payer: Self-pay

## 2024-09-05 VITALS — BP 118/56 | HR 62 | Temp 98.3°F | Ht 61.0 in | Wt 135.0 lb

## 2024-09-05 DIAGNOSIS — L299 Pruritus, unspecified: Secondary | ICD-10-CM | POA: Diagnosis not present

## 2024-09-05 MED ORDER — PERMETHRIN 5 % EX CREA
1.0000 | TOPICAL_CREAM | Freq: Once | CUTANEOUS | 0 refills | Status: AC
  Filled 2024-09-05: qty 60, 1d supply, fill #0

## 2024-09-05 MED ORDER — GABAPENTIN 300 MG PO CAPS
300.0000 mg | ORAL_CAPSULE | Freq: Three times a day (TID) | ORAL | 3 refills | Status: AC | PRN
  Filled 2024-09-05: qty 90, 30d supply, fill #0
  Filled 2024-11-27: qty 90, 30d supply, fill #1

## 2024-09-05 MED ORDER — HYDROCORTISONE 2.5 % EX CREA
1.0000 | TOPICAL_CREAM | Freq: Two times a day (BID) | CUTANEOUS | 2 refills | Status: AC
  Filled 2024-09-05: qty 30, 10d supply, fill #0
  Filled 2024-11-27: qty 30, 30d supply, fill #1
  Filled 2025-01-21: qty 30, 30d supply, fill #2

## 2024-09-05 NOTE — Progress Notes (Signed)
 Subjective:    Patient ID: Monique [name], female    DOB: 10/23/1956, 68 y.o.   MRN: [id number]  Rash  Patient is currently on Ibrance  for metastatic breast cancer.  Over the last 3 weeks, she has developed itching all over her body.  She denies any visible rash.  There are 3 scabbed lesions on her medial left ankle.  Each is approximately 4 mm in diameter where she has been scratching so hard.  She also has 1 erythematous papule on her superior left breast.  This is roughly 4 to 5 mm in diameter and appears to be just a pink-colored papule.  Other than that there is no visible rash.  She states however at night, she itches terribly and scratches constantly all over her body.  She is on no new medication.  Her husband is not itching although they do not share the same bed.  She has been washing her sheets often without improvement.  There is no new change in detergent etc.  Her dog is not itching. Past Medical History:  Diagnosis Date   Abnormal EKG    Anxiety    Phreesia 11/29/2020   Arthritis    Phreesia 11/29/2020   Barrett's esophagus    Breast cancer (HCC) 2018   Left breast   Cancer (HCC)    Phreesia 11/29/2020   Complication of anesthesia    Family history of breast cancer    Family history of prostate cancer    Family history of thyroid  cancer    Fatigue    Generalized headaches    headaches are less since using a mouth piece to prevent grinding of teeth   Hepatitis    as a teenager   IBS (irritable bowel syndrome)    Meniere disease    Osteopenia    Pneumonia    PONV (postoperative nausea and vomiting)    Psoriasis    Tinnitus    left ear   Past Surgical History:  Procedure Laterality Date   BREAST SURGERY N/A    Phreesia 11/29/2020   BRONCHIAL BIOPSY  01/17/2023   Procedure: BRONCHIAL BIOPSIES;  Surgeon: Brenna Adine CROME, DO;  Location: [facility];  Service: Pulmonary;;   BRONCHIAL BRUSHINGS  01/17/2023   Procedure: BRONCHIAL BRUSHINGS;  Surgeon: Brenna Adine CROME, DO;  Location: [facility];  Service: Pulmonary;;   DILATION AND CURETTAGE OF UTERUS     FINE NEEDLE ASPIRATION  05/10/2022   Procedure: FINE NEEDLE ASPIRATION (FNA) LINEAR;  Surgeon: Brenna Adine CROME, DO;  Location: [facility];  Service: Pulmonary;;   MASTECTOMY Left 2018   MASTECTOMY W/ SENTINEL NODE BIOPSY Left 08/03/2017   Procedure: LEFT MASTECTOMY WITH LEFT AXILLARY SENTINEL LYMPH NODE BIOPSY;  Surgeon: Ethyl Lenis, MD;  Location: [facility];  Service: General;  Laterality: Left;   skin cancer removal     TONSILLECTOMY     VIDEO BRONCHOSCOPY WITH ENDOBRONCHIAL ULTRASOUND N/A 05/10/2022   Procedure: VIDEO BRONCHOSCOPY WITH ENDOBRONCHIAL ULTRASOUND;  Surgeon: Brenna Adine CROME, DO;  Location: [facility];  Service: Pulmonary;  Laterality: N/A;   VIDEO BRONCHOSCOPY WITH ENDOBRONCHIAL ULTRASOUND Right 01/17/2023   Procedure: VIDEO BRONCHOSCOPY WITH ENDOBRONCHIAL ULTRASOUND;  Surgeon: Brenna Adine CROME, DO;  Location: [facility];  Service: Pulmonary;  Laterality: Right;   Current Outpatient Medications on File Prior to Visit  Medication Sig Dispense Refill   anastrozole  (ARIMIDEX ) 1 MG tablet Take 1 tablet (1 mg total) by mouth daily. 90 tablet 3   diazepam  (VALIUM ) 10  MG tablet Take 1 tablet (10 mg total) by mouth every 12 (twelve) hours as needed for anxiety. 30 tablet 3   Multiple Vitamins-Minerals (AIRBORNE PO) Take 1 tablet by mouth daily as needed (immune support).     palbociclib  (IBRANCE ) 75 MG tablet Take 1 tablet (75 mg total) by mouth daily. Take for 21 days on, 7 days off, repeat every 28 days. 21 tablet 6   escitalopram  (LEXAPRO ) 10 MG tablet Take 1 tablet (10 mg total) by mouth daily. (Patient not taking: Reported on 08/22/2024) 90 tablet 1   hydrocortisone  (ANUSOL -HC) 25 MG suppository Place 1 suppository (25 mg total) rectally 2 (two) times daily. (Patient not taking: Reported on 09/05/2024) 12 suppository 0   hydrocortisone  2.5 % cream Apply topically  as a thin layer twice daily to affected area as needed. (Patient not taking: Reported on 09/05/2024) 30 g 2   Current Facility-Administered Medications on File Prior to Visit  Medication Dose Route Frequency Provider Last Rate Last Admin   0.9 %  sodium chloride  infusion  500 mL Intravenous Once Stacia Glendia BRAVO, MD       Allergies  Allergen Reactions   Chantix [Varenicline]     Hallucination   Codeine Nausea And Vomiting   Wellbutrin  [Bupropion ] Other (See Comments)    Hallucinations     Social History   Socioeconomic History   Marital status: Married    Spouse name: Not on file   Number of children: Not on file   Years of education: Not on file   Highest education level: Associate degree: occupational, Scientist, product/process development, or vocational program  Occupational History   Not on file  Tobacco Use   Smoking status: Former    Current packs/day: 0.00    Types: Cigarettes    Quit date: 2018    Years since quitting: 7.7    Passive exposure: Past   Smokeless tobacco: Never  Vaping Use   Vaping status: Never Used  Substance and Sexual Activity   Alcohol use: Yes    Comment: occas   Drug use: Never   Sexual activity: Not Currently    Birth control/protection: Post-menopausal  Other Topics Concern   Not on file  Social History Narrative   Not on file   Social Drivers of Health   Financial Resource Strain: Medium Risk (08/22/2024)   Overall Financial Resource Strain (CARDIA)    Difficulty of Paying Living Expenses: Somewhat hard  Food Insecurity: No Food Insecurity (08/22/2024)   Hunger Vital Sign    Worried About Running Out of Food in the Last Year: Never true    Ran Out of Food in the Last Year: Never true  Recent Concern: Food Insecurity - Food Insecurity Present (08/16/2024)   Hunger Vital Sign    Worried About Running Out of Food in the Last Year: Never true    Ran Out of Food in the Last Year: Sometimes true  Transportation Needs: No Transportation Needs (08/22/2024)   PRAPARE  - Administrator, Civil Service (Medical): No    Lack of Transportation (Non-Medical): No  Physical Activity: Sufficiently Active (08/22/2024)   Exercise Vital Sign    Days of Exercise per Week: 5 days    Minutes of Exercise per Session: 60 min  Stress: Stress Concern Present (08/22/2024)   [employer] of Occupational Health - Occupational Stress Questionnaire    Feeling of Stress: Very much  Social Connections: Moderately Integrated (08/22/2024)   Social Connection and Isolation Panel  Frequency of Communication with Friends and Family: More than three times a week    Frequency of Social Gatherings with Friends and Family: Once a week    Attends Religious Services: 1 to 4 times per year    Active Member of [employer] or Organizations: No    Attends Banker Meetings: Never    Marital Status: Married  Catering manager Violence: Unknown (08/22/2024)   Humiliation, Afraid, Rape, and Kick questionnaire    Fear of Current or Ex-Partner: Patient declined    Emotionally Abused: Patient declined    Physically Abused: No    Sexually Abused: No      Review of Systems  Skin:  Positive for rash.  All other systems reviewed and are negative.      Objective:   Physical Exam Vitals reviewed.  Constitutional:      General: She is not in acute distress.    Appearance: She is well-developed. She is not diaphoretic.  HENT:     Head: Normocephalic and atraumatic.  Neck:     Thyroid : No thyromegaly.     Vascular: No JVD.     Trachea: No tracheal deviation.  Cardiovascular:     Rate and Rhythm: Regular rhythm.     Heart sounds: Normal heart sounds. No murmur heard.    No friction rub. No gallop.  Pulmonary:     Effort: Pulmonary effort is normal. No respiratory distress.     Breath sounds: Normal breath sounds. No stridor. No wheezing or rales.  Chest:     Chest wall: No tenderness.  Neurological:     Mental Status: She is alert.     Motor: No abnormal muscle  tone.           Assessment & Plan:  Pruritus Itching is out of proportion to the rash.  There are only 4 small erythematous papules, 3 on the ankle and 1 on the left breast.  Each is only 4 to 5 mm in diameter.  She states that during the day the itching is not noticeable but at night when she lays down the itching becomes severe and it tends to be all over her body.  I believe that this is neurogenic/neuropathic itching possibly related to nerve damage from cancer treatment.  Also would be scabies on the differential diagnosis.  Will try treating both.  She can use gabapentin  300 mg p.o. 3 times daily as needed itching.  She can use Elimite  cream applied once head to toe and rinse off after 8 hours.  Wait to see if the itching improves.  Patient is not jaundiced.

## 2024-09-10 ENCOUNTER — Other Ambulatory Visit: Payer: Self-pay

## 2024-09-10 ENCOUNTER — Other Ambulatory Visit: Payer: Self-pay | Admitting: Oncology

## 2024-09-10 ENCOUNTER — Encounter (INDEPENDENT_AMBULATORY_CARE_PROVIDER_SITE_OTHER): Payer: Self-pay

## 2024-09-10 DIAGNOSIS — C7800 Secondary malignant neoplasm of unspecified lung: Secondary | ICD-10-CM

## 2024-09-10 MED ORDER — PALBOCICLIB 75 MG PO TABS
75.0000 mg | ORAL_TABLET | Freq: Every day | ORAL | 6 refills | Status: AC
  Filled 2024-09-10: qty 21, 28d supply, fill #0
  Filled 2024-10-04: qty 21, 28d supply, fill #1
  Filled 2024-10-28 – 2024-11-18 (×2): qty 21, 28d supply, fill #2
  Filled 2024-12-13: qty 21, 28d supply, fill #3
  Filled 2025-01-09: qty 21, 28d supply, fill #4

## 2024-09-10 NOTE — Progress Notes (Signed)
 Specialty Pharmacy Refill Coordination Note  Monique [name] is a 68 y.o. female contacted today regarding refills of specialty medication(s) Palbociclib  (IBRANCE )   Patient requested (Patient-Rptd) Delivery   Delivery date: 09/13/24   Verified address: (Patient-Rptd) [address] [place] [address]   Medication will be filled on 09/12/24, pending refill approval.

## 2024-09-11 ENCOUNTER — Other Ambulatory Visit

## 2024-09-11 ENCOUNTER — Other Ambulatory Visit: Payer: Self-pay

## 2024-09-11 DIAGNOSIS — R300 Dysuria: Secondary | ICD-10-CM

## 2024-09-12 ENCOUNTER — Ambulatory Visit: Payer: Self-pay | Admitting: Family Medicine

## 2024-09-12 ENCOUNTER — Other Ambulatory Visit: Payer: Self-pay

## 2024-09-12 ENCOUNTER — Other Ambulatory Visit (HOSPITAL_COMMUNITY): Payer: Self-pay

## 2024-09-12 DIAGNOSIS — R399 Unspecified symptoms and signs involving the genitourinary system: Secondary | ICD-10-CM

## 2024-09-12 LAB — URINALYSIS, ROUTINE W REFLEX MICROSCOPIC
Bacteria, UA: NONE SEEN /HPF
Bilirubin Urine: NEGATIVE
Glucose, UA: NEGATIVE
Hgb urine dipstick: NEGATIVE
Hyaline Cast: NONE SEEN /LPF
Ketones, ur: NEGATIVE
Nitrite: NEGATIVE
Protein, ur: NEGATIVE
Specific Gravity, Urine: 1.014 (ref 1.001–1.035)
pH: 7.5 (ref 5.0–8.0)

## 2024-09-12 LAB — MICROSCOPIC MESSAGE

## 2024-09-12 MED ORDER — CEPHALEXIN 500 MG PO CAPS
500.0000 mg | ORAL_CAPSULE | Freq: Three times a day (TID) | ORAL | 0 refills | Status: AC
  Filled 2024-09-12: qty 15, 5d supply, fill #0

## 2024-09-13 LAB — URINE CULTURE
MICRO NUMBER:: 17011015
SPECIMEN QUALITY:: ADEQUATE

## 2024-10-03 ENCOUNTER — Other Ambulatory Visit: Payer: Self-pay

## 2024-10-04 ENCOUNTER — Encounter (INDEPENDENT_AMBULATORY_CARE_PROVIDER_SITE_OTHER): Payer: Self-pay

## 2024-10-04 ENCOUNTER — Other Ambulatory Visit: Payer: Self-pay

## 2024-10-04 ENCOUNTER — Other Ambulatory Visit (HOSPITAL_COMMUNITY): Payer: Self-pay

## 2024-10-04 NOTE — Progress Notes (Signed)
 Specialty Pharmacy Refill Coordination Note  Monique [name] is a 68 y.o. female contacted today regarding refills of specialty medication(s) Palbociclib  (IBRANCE )   Patient requested (Patient-Rptd) Delivery   Delivery date: 10/09/24   Verified address: (Patient-Rptd) [address] [place] [address]   Medication will be filled on 10/08/24.

## 2024-10-08 ENCOUNTER — Other Ambulatory Visit: Payer: Self-pay

## 2024-10-22 ENCOUNTER — Other Ambulatory Visit

## 2024-10-22 ENCOUNTER — Ambulatory Visit: Admitting: Oncology

## 2024-10-25 ENCOUNTER — Telehealth: Payer: Self-pay | Admitting: Pharmacy Technician

## 2024-10-25 ENCOUNTER — Other Ambulatory Visit (HOSPITAL_COMMUNITY): Payer: Self-pay

## 2024-10-25 NOTE — Telephone Encounter (Signed)
 Oral Oncology Patient Advocate Encounter  Was successful in securing patient a $7,500 grant from [facility] to provide copayment coverage for Ibrance .  This will keep the out of pocket expense at $0.     Healthwell ID: [id number]   The billing information is as follows and has been shared with [facility].    RxBin: [id number] PCN: [id number] Member ID: [id number] Group ID: [id number] Dates of Eligibility: 09/24/2024 through 09/23/2025  Fund:  Breast Cancer - Medicare Access  [place] (Patty) Chet Burnet, CPhT  [facility], Zelda Salmon, [facility] - Oral Chemotherapy Patient Advocate Specialist III Phone: [phone]  Fax: [phone]

## 2024-10-28 ENCOUNTER — Other Ambulatory Visit: Payer: Self-pay

## 2024-11-08 ENCOUNTER — Inpatient Hospital Stay (HOSPITAL_BASED_OUTPATIENT_CLINIC_OR_DEPARTMENT_OTHER): Admitting: Oncology

## 2024-11-08 ENCOUNTER — Inpatient Hospital Stay: Attending: Oncology

## 2024-11-08 ENCOUNTER — Other Ambulatory Visit: Payer: Self-pay

## 2024-11-08 ENCOUNTER — Encounter: Payer: Self-pay | Admitting: Oncology

## 2024-11-08 VITALS — BP 115/68 | HR 60 | Temp 97.3°F | Resp 16 | Ht 61.0 in | Wt 139.3 lb

## 2024-11-08 DIAGNOSIS — Z79811 Long term (current) use of aromatase inhibitors: Secondary | ICD-10-CM | POA: Diagnosis not present

## 2024-11-08 DIAGNOSIS — Z170 Estrogen receptor positive status [ER+]: Secondary | ICD-10-CM | POA: Insufficient documentation

## 2024-11-08 DIAGNOSIS — C7800 Secondary malignant neoplasm of unspecified lung: Secondary | ICD-10-CM

## 2024-11-08 DIAGNOSIS — Z79899 Other long term (current) drug therapy: Secondary | ICD-10-CM

## 2024-11-08 DIAGNOSIS — C50811 Malignant neoplasm of overlapping sites of right female breast: Secondary | ICD-10-CM | POA: Insufficient documentation

## 2024-11-08 LAB — CBC WITH DIFFERENTIAL (CANCER CENTER ONLY)
Abs Immature Granulocytes: 0.1 K/uL — ABNORMAL HIGH (ref 0.00–0.07)
Basophils Absolute: 0.1 K/uL (ref 0.0–0.1)
Basophils Relative: 3 %
Eosinophils Absolute: 0.1 K/uL (ref 0.0–0.5)
Eosinophils Relative: 2 %
HCT: 37 % (ref 36.0–46.0)
Hemoglobin: 12.6 g/dL (ref 12.0–15.0)
Immature Granulocytes: 3 %
Lymphocytes Relative: 46 %
Lymphs Abs: 1.6 K/uL (ref 0.7–4.0)
MCH: 33.2 pg (ref 26.0–34.0)
MCHC: 34.1 g/dL (ref 30.0–36.0)
MCV: 97.6 fL (ref 80.0–100.0)
Monocytes Absolute: 0.4 K/uL (ref 0.1–1.0)
Monocytes Relative: 11 %
Neutro Abs: 1.2 K/uL — ABNORMAL LOW (ref 1.7–7.7)
Neutrophils Relative %: 35 %
Platelet Count: 296 K/uL (ref 150–400)
RBC: 3.79 MIL/uL — ABNORMAL LOW (ref 3.87–5.11)
RDW: 14.1 % (ref 11.5–15.5)
Smear Review: NORMAL
WBC Count: 3.5 K/uL — ABNORMAL LOW (ref 4.0–10.5)
nRBC: 0 % (ref 0.0–0.2)

## 2024-11-08 LAB — CMP (CANCER CENTER ONLY)
ALT: 12 U/L (ref 0–44)
AST: 22 U/L (ref 15–41)
Albumin: 4.5 g/dL (ref 3.5–5.0)
Alkaline Phosphatase: 62 U/L (ref 38–126)
Anion gap: 12 (ref 5–15)
BUN: 9 mg/dL (ref 8–23)
CO2: 23 mmol/L (ref 22–32)
Calcium: 9.1 mg/dL (ref 8.9–10.3)
Chloride: 98 mmol/L (ref 98–111)
Creatinine: 0.96 mg/dL (ref 0.44–1.00)
GFR, Estimated: >60 mL/min (ref >60)
Glucose, Bld: 97 mg/dL (ref 70–99)
Potassium: 4.3 mmol/L (ref 3.5–5.1)
Sodium: 133 mmol/L — ABNORMAL LOW (ref 135–145)
Total Bilirubin: 0.5 mg/dL (ref 0.0–1.2)
Total Protein: 8.2 g/dL — ABNORMAL HIGH (ref 6.5–8.1)

## 2024-11-08 NOTE — Progress Notes (Signed)
 diarrhea, indigestion, bloating and gas; denies pain. Started ibrance  last night, it's the week she started the estrogen blocker and has a hard time with it. Keeps gaining weight, not eating much.  Bad indigestion, used to be on protonix .

## 2024-11-12 LAB — CANCER ANTIGEN 27.29: CA 27.29: 31.1 U/mL (ref 0.0–38.6)

## 2024-11-18 ENCOUNTER — Other Ambulatory Visit: Payer: Self-pay

## 2024-11-18 NOTE — Progress Notes (Signed)
 Specialty Pharmacy Refill Coordination Note  Monique [name] is a 68 y.o. female contacted today regarding refills of specialty medication(s) Palbociclib  (IBRANCE )   Patient requested Delivery   Delivery date: 11/25/24   Verified address: [address] [place] [address]   Medication will be filled on: 11/22/24

## 2024-11-18 NOTE — Progress Notes (Signed)
 Specialty Pharmacy Ongoing Clinical Assessment Note  Monique [name] is a 68 y.o. female who is being followed by the specialty pharmacy service for RxSp Oncology   Patient's specialty medication(s) reviewed today: Palbociclib  (IBRANCE )   Missed doses in the last 4 weeks: 0   Patient/Caregiver asked additional questions regarding herbal medications, see intervention notes below.  Therapeutic benefit summary: Patient is achieving benefit   Adverse events/side effects summary: Experienced adverse events/side effects (slightly low WBC and RBC, pt is being closely monitored and having no issues at this time)   Patient's therapy is appropriate to: Continue    Goals Addressed             This Visit's Progress    Slow Disease Progression   On track    Patient is on track. Patient will maintain adherence and be monitored by provider to determine if a change in treatment plan is warranted.  Per office visit notes from 07/30/24, scan from July shows stable uptake and no areas of progressive disease.         Follow up: 6 months  Silvano LOISE Dolly Specialty Pharmacist   Clinical Intervention Note  Clinical Intervention Notes: Patient was taking Tumeric for weight loss, found via the natural medication database that it does interact with Ibrance .  I counseled the patien ton this DDI and she will stop the Tumeric.   Clinical Intervention Outcomes: Prevention of an adverse drug event   Silvano LOISE Dolly Karel Santa

## 2024-11-21 ENCOUNTER — Other Ambulatory Visit: Payer: Self-pay

## 2024-11-24 NOTE — Progress Notes (Unsigned)
 Hematology/Oncology Consult note [facility]  [phone] Fax:([phone]) [phone]  Patient Care Team: Duanne Butler DASEN, MD as PCP - General (Family Medicine) Lonni Slain, MD as PCP - Cardiology (Cardiology) Marilynn Nest, DO as Consulting Physician (Obstetrics and Gynecology) Ethyl Lenis, MD as Consulting Physician (General Surgery) Dewey Rush, MD as Consulting Physician (Radiation Oncology) Melanee Annah BROCKS, MD as Consulting Physician (Oncology)   Name of the patient: Monique [name]  [id number]  February 21, 1956   Date of visit: 11/24/24  Diagnosis- ***  Chief complaint/ Reason for visit- ***  Heme/Onc history: ***  Interval history- ***  ECOG PS- *** Pain scale- *** Opioid associated constipation- ***  Review of systems- ROS    Allergies  Allergen Reactions   Chantix [Varenicline]     Hallucination   Codeine Nausea And Vomiting   Wellbutrin  [Bupropion ] Other (See Comments)    Hallucinations       Past Medical History:  Diagnosis Date   Abnormal EKG    Anxiety    Phreesia 11/29/2020   Arthritis    Phreesia 11/29/2020   Barrett's esophagus    Breast cancer (HCC) 2018   Left breast   Cancer (HCC)    Phreesia 11/29/2020   Complication of anesthesia    Family history of breast cancer    Family history of prostate cancer    Family history of thyroid  cancer    Fatigue    Generalized headaches    headaches are less since using a mouth piece to prevent grinding of teeth   Hepatitis    as a teenager   IBS (irritable bowel syndrome)    Meniere disease    Osteopenia    Pneumonia    PONV (postoperative nausea and vomiting)    Psoriasis    Tinnitus    left ear     Past Surgical History:  Procedure Laterality Date   BREAST SURGERY N/A    Phreesia 11/29/2020   BRONCHIAL BIOPSY  01/17/2023   Procedure: BRONCHIAL BIOPSIES;  Surgeon: Brenna Adine CROME, DO;  Location: [facility];  Service: Pulmonary;;    BRONCHIAL BRUSHINGS  01/17/2023   Procedure: BRONCHIAL BRUSHINGS;  Surgeon: Brenna Adine CROME, DO;  Location: [facility];  Service: Pulmonary;;   DILATION AND CURETTAGE OF UTERUS     FINE NEEDLE ASPIRATION  05/10/2022   Procedure: FINE NEEDLE ASPIRATION (FNA) LINEAR;  Surgeon: Brenna Adine CROME, DO;  Location: [facility];  Service: Pulmonary;;   MASTECTOMY Left 2018   MASTECTOMY W/ SENTINEL NODE BIOPSY Left 08/03/2017   Procedure: LEFT MASTECTOMY WITH LEFT AXILLARY SENTINEL LYMPH NODE BIOPSY;  Surgeon: Ethyl Lenis, MD;  Location: [facility];  Service: General;  Laterality: Left;   skin cancer removal     TONSILLECTOMY     VIDEO BRONCHOSCOPY WITH ENDOBRONCHIAL ULTRASOUND N/A 05/10/2022   Procedure: VIDEO BRONCHOSCOPY WITH ENDOBRONCHIAL ULTRASOUND;  Surgeon: Brenna Adine CROME, DO;  Location: [facility];  Service: Pulmonary;  Laterality: N/A;   VIDEO BRONCHOSCOPY WITH ENDOBRONCHIAL ULTRASOUND Right 01/17/2023   Procedure: VIDEO BRONCHOSCOPY WITH ENDOBRONCHIAL ULTRASOUND;  Surgeon: Brenna Adine CROME, DO;  Location: [facility];  Service: Pulmonary;  Laterality: Right;    Social History   Socioeconomic History   Marital status: Married    Spouse name: Not on file   Number of children: Not on file   Years of education: Not on file   Highest education level: Associate degree: occupational, scientist, product/process development, or vocational program  Occupational History   Not on  file  Tobacco Use   Smoking status: Former    Current packs/day: 0.00    Types: Cigarettes    Quit date: 2018    Years since quitting: 7.9    Passive exposure: Past   Smokeless tobacco: Never  Vaping Use   Vaping status: Never Used  Substance and Sexual Activity   Alcohol use: Yes    Comment: occas   Drug use: Never   Sexual activity: Not Currently    Birth control/protection: Post-menopausal  Other Topics Concern   Not on file  Social History Narrative   Not on file   Social Drivers of Health   Financial  Resource Strain: Medium Risk (08/22/2024)   Overall Financial Resource Strain (CARDIA)    Difficulty of Paying Living Expenses: Somewhat hard  Food Insecurity: No Food Insecurity (08/22/2024)   Hunger Vital Sign    Worried About Running Out of Food in the Last Year: Never true    Ran Out of Food in the Last Year: Never true  Recent Concern: Food Insecurity - Food Insecurity Present (08/16/2024)   Hunger Vital Sign    Worried About Running Out of Food in the Last Year: Never true    Ran Out of Food in the Last Year: Sometimes true  Transportation Needs: No Transportation Needs (08/22/2024)   PRAPARE - Administrator, Civil Service (Medical): No    Lack of Transportation (Non-Medical): No  Physical Activity: Sufficiently Active (08/22/2024)   Exercise Vital Sign    Days of Exercise per Week: 5 days    Minutes of Exercise per Session: 60 min  Stress: Stress Concern Present (08/22/2024)   [employer] of Occupational Health - Occupational Stress Questionnaire    Feeling of Stress: Very much  Social Connections: Moderately Integrated (08/22/2024)   Social Connection and Isolation Panel    Frequency of Communication with Friends and Family: More than three times a week    Frequency of Social Gatherings with Friends and Family: Once a week    Attends Religious Services: 1 to 4 times per year    Active Member of [employer] or Organizations: No    Attends Banker Meetings: Never    Marital Status: Married  Catering Manager Violence: Unknown (08/22/2024)   Humiliation, Afraid, Rape, and Kick questionnaire    Fear of Current or Ex-Partner: Patient declined    Emotionally Abused: Patient declined    Physically Abused: No    Sexually Abused: No    Family History  Problem Relation Age of Onset   Hypertension Mother    Heart attack Father    Hypertension Father    Healthy Maternal Aunt    Healthy Maternal Uncle    Breast cancer Paternal Aunt 31       bilateral   Healthy  Paternal Aunt    Prostate cancer Paternal Uncle    Thyroid  cancer Paternal Uncle    Healthy Paternal Uncle    Breast cancer Cousin 65       paternal first cousin   Breast cancer Cousin        paternal first cousin   Colon cancer Neg Hx    Esophageal cancer Neg Hx    Rectal cancer Neg Hx    Stomach cancer Neg Hx      Current Outpatient Medications:    anastrozole  (ARIMIDEX ) 1 MG tablet, Take 1 tablet (1 mg total) by mouth daily., Disp: 90 tablet, Rfl: 3   diazepam  (VALIUM ) 10 MG tablet, Take  1 tablet (10 mg total) by mouth every 12 (twelve) hours as needed for anxiety., Disp: 30 tablet, Rfl: 3   gabapentin  (NEURONTIN ) 300 MG capsule, Take 1 capsule (300 mg total) by mouth 3 (three) times daily as needed (nerve itching)., Disp: 90 capsule, Rfl: 3   hydrocortisone  2.5 % cream, Apply topically as a thin layer twice daily to affected area as needed. (Patient taking differently: Apply topically as needed.), Disp: 30 g, Rfl: 2   hydrocortisone  2.5 % cream, Apply 1 Application topically in the morning and at bedtime., Disp: 30 g, Rfl: 2   lactobacillus acidophilus (BACID) TABS tablet, Take 1 tablet by mouth as needed., Disp: , Rfl:    Multiple Vitamins-Minerals (AIRBORNE PO), Take 1 tablet by mouth daily as needed (immune support)., Disp: , Rfl:    palbociclib  (IBRANCE ) 75 MG tablet, Take 1 tablet (75 mg total) by mouth daily. Take for 21 days on, 7 days off, repeat every 28 days., Disp: 21 tablet, Rfl: 6   cephALEXin  (KEFLEX ) 500 MG capsule, Take 1 capsule (500 mg total) by mouth 3 (three) times daily. (Patient not taking: Reported on 11/08/2024), Disp: 15 capsule, Rfl: 0   escitalopram  (LEXAPRO ) 10 MG tablet, Take 1 tablet (10 mg total) by mouth daily. (Patient not taking: Reported on 11/08/2024), Disp: 90 tablet, Rfl: 1  Current Facility-Administered Medications:    0.9 %  sodium chloride  infusion, 500 mL, Intravenous, Once, Stacia Glendia BRAVO, MD  Physical exam:  Vitals:   11/08/24 1023   BP: 115/68  Pulse: 60  Resp: 16  Temp: (!) 97.3 F (36.3 C)  TempSrc: Tympanic  SpO2: 100%  Weight: 139 lb 4.8 oz (63.2 kg)  Height: 5' 1 (1.549 m)   Physical Exam   I have personally reviewed labs listed below:    Latest Ref Rng & Units 11/08/2024   10:02 AM  CMP  Glucose 70 - 99 mg/dL 97   BUN 8 - 23 mg/dL 9   Creatinine 9.55 - 8.99 mg/dL 9.03   Sodium 864 - 854 mmol/L 133   Potassium 3.5 - 5.1 mmol/L 4.3   Chloride 98 - 111 mmol/L 98   CO2 22 - 32 mmol/L 23   Calcium  8.9 - 10.3 mg/dL 9.1   Total Protein 6.5 - 8.1 g/dL 8.2   Total Bilirubin 0.0 - 1.2 mg/dL 0.5   Alkaline Phos 38 - 126 U/L 62   AST 15 - 41 U/L 22   ALT 0 - 44 U/L 12       Latest Ref Rng & Units 11/08/2024   10:02 AM  CBC  WBC 4.0 - 10.5 K/uL 3.5   Hemoglobin 12.0 - 15.0 g/dL 87.3   Hematocrit 63.9 - 46.0 % 37.0   Platelets 150 - 400 K/uL 296    I have personally reviewed Radiology images listed below: No images are attached to the encounter.  No results found.   Assessment and plan- Patient is a 68 y.o. female ***   Visit Diagnosis 1. 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 High risk medication use   3. Use of letrozole  (Femara )      Dr. Annah Skene, MD, MPH [facility] [phone] 11/24/2024 7:00 PM

## 2024-11-27 ENCOUNTER — Other Ambulatory Visit: Payer: Self-pay | Admitting: Family Medicine

## 2024-11-27 ENCOUNTER — Other Ambulatory Visit (HOSPITAL_COMMUNITY): Payer: Self-pay

## 2024-11-27 ENCOUNTER — Other Ambulatory Visit: Payer: Self-pay

## 2024-11-28 ENCOUNTER — Other Ambulatory Visit: Payer: Self-pay

## 2024-11-28 ENCOUNTER — Other Ambulatory Visit (HOSPITAL_BASED_OUTPATIENT_CLINIC_OR_DEPARTMENT_OTHER): Payer: Self-pay

## 2024-11-28 MED ORDER — DIAZEPAM 10 MG PO TABS
10.0000 mg | ORAL_TABLET | Freq: Two times a day (BID) | ORAL | 3 refills | Status: AC | PRN
  Filled 2024-11-28: qty 30, 15d supply, fill #0
  Filled 2025-01-21: qty 30, 15d supply, fill #1

## 2024-12-13 ENCOUNTER — Other Ambulatory Visit (HOSPITAL_COMMUNITY): Payer: Self-pay

## 2024-12-13 ENCOUNTER — Other Ambulatory Visit: Payer: Self-pay

## 2024-12-13 NOTE — Progress Notes (Signed)
 Specialty Pharmacy Refill Coordination Note  Monique [name] is a 68 y.o. female contacted today regarding refills of specialty medication(s) Palbociclib  (IBRANCE )   Patient requested Delivery   Delivery date: 12/20/24   Verified address: [address] [place] [address]   Medication will be filled on: 12/18/24

## 2024-12-18 ENCOUNTER — Other Ambulatory Visit: Payer: Self-pay

## 2025-01-07 ENCOUNTER — Ambulatory Visit
Admission: RE | Admit: 2025-01-07 | Discharge: 2025-01-07 | Disposition: A | Discharge status: Home / Self Care | Source: Ambulatory Visit | Attending: Oncology | Admitting: Oncology

## 2025-01-07 DIAGNOSIS — C50919 Malignant neoplasm of unspecified site of unspecified female breast: Secondary | ICD-10-CM | POA: Insufficient documentation

## 2025-01-07 DIAGNOSIS — I700 Atherosclerosis of aorta: Secondary | ICD-10-CM | POA: Insufficient documentation

## 2025-01-07 DIAGNOSIS — C7800 Secondary malignant neoplasm of unspecified lung: Secondary | ICD-10-CM | POA: Diagnosis present

## 2025-01-07 LAB — GLUCOSE, CAPILLARY: Glucose-Capillary: 84 mg/dL (ref 70–99)

## 2025-01-07 MED ORDER — FLUDEOXYGLUCOSE F - 18 (FDG) INJECTION
7.2000 | Freq: Once | INTRAVENOUS | Status: AC | PRN
  Administered 2025-01-07: 7.82 via INTRAVENOUS

## 2025-01-09 ENCOUNTER — Encounter (INDEPENDENT_AMBULATORY_CARE_PROVIDER_SITE_OTHER): Payer: Self-pay

## 2025-01-09 ENCOUNTER — Other Ambulatory Visit: Payer: Self-pay

## 2025-01-09 ENCOUNTER — Other Ambulatory Visit (HOSPITAL_COMMUNITY): Payer: Self-pay

## 2025-01-09 NOTE — Progress Notes (Signed)
 Specialty Pharmacy Refill Coordination Note  Monique [name] is a 69 y.o. female contacted today regarding refills of specialty medication(s) Palbociclib  (IBRANCE )   Patient requested Delivery   Delivery date: 01/17/25   Verified address: [address] [place] [address]   Medication will be filled on: 01/16/25

## 2025-01-10 ENCOUNTER — Other Ambulatory Visit: Payer: Self-pay

## 2025-01-16 ENCOUNTER — Other Ambulatory Visit: Payer: Self-pay

## 2025-01-21 ENCOUNTER — Other Ambulatory Visit: Payer: Self-pay

## 2025-01-21 ENCOUNTER — Other Ambulatory Visit: Payer: Self-pay | Admitting: Oncology

## 2025-01-21 DIAGNOSIS — C50919 Malignant neoplasm of unspecified site of unspecified female breast: Secondary | ICD-10-CM

## 2025-01-22 ENCOUNTER — Other Ambulatory Visit (HOSPITAL_COMMUNITY): Payer: Self-pay

## 2025-01-22 ENCOUNTER — Other Ambulatory Visit: Payer: Self-pay

## 2025-01-22 MED ORDER — ANASTROZOLE 1 MG PO TABS
1.0000 mg | ORAL_TABLET | Freq: Every day | ORAL | 3 refills | Status: AC
  Filled 2025-01-22: qty 90, 90d supply, fill #0

## 2025-01-24 ENCOUNTER — Inpatient Hospital Stay: Attending: Oncology

## 2025-01-24 ENCOUNTER — Inpatient Hospital Stay: Admitting: Oncology

## 2025-01-24 ENCOUNTER — Other Ambulatory Visit: Payer: Self-pay

## 2025-01-24 VITALS — BP 131/77 | HR 54 | Temp 96.9°F | Resp 17 | Ht 61.0 in | Wt 140.8 lb

## 2025-01-24 DIAGNOSIS — Z79899 Other long term (current) drug therapy: Secondary | ICD-10-CM

## 2025-01-24 DIAGNOSIS — Z5181 Encounter for therapeutic drug level monitoring: Secondary | ICD-10-CM

## 2025-01-24 DIAGNOSIS — C50919 Malignant neoplasm of unspecified site of unspecified female breast: Secondary | ICD-10-CM

## 2025-01-24 LAB — CMP (CANCER CENTER ONLY)
ALT: <5 U/L (ref 0–44)
AST: 18 U/L (ref 15–41)
Albumin: 4.6 g/dL (ref 3.5–5.0)
Alkaline Phosphatase: 64 U/L (ref 38–126)
Anion gap: 13 (ref 5–15)
BUN: 12 mg/dL (ref 8–23)
CO2: 25 mmol/L (ref 22–32)
Calcium: 9.3 mg/dL (ref 8.9–10.3)
Chloride: 100 mmol/L (ref 98–111)
Creatinine: 0.97 mg/dL (ref 0.44–1.00)
GFR, Estimated: >60 mL/min
Glucose, Bld: 97 mg/dL (ref 70–99)
Potassium: 4.2 mmol/L (ref 3.5–5.1)
Sodium: 138 mmol/L (ref 135–145)
Total Bilirubin: 0.4 mg/dL (ref 0.0–1.2)
Total Protein: 7.3 g/dL (ref 6.5–8.1)

## 2025-01-24 LAB — CBC WITH DIFFERENTIAL (CANCER CENTER ONLY)
Abs Immature Granulocytes: 0.01 10*3/uL (ref 0.00–0.07)
Basophils Absolute: 0.1 10*3/uL (ref 0.0–0.1)
Basophils Relative: 2 %
Eosinophils Absolute: 0.1 10*3/uL (ref 0.0–0.5)
Eosinophils Relative: 3 %
HCT: 36.2 % (ref 36.0–46.0)
Hemoglobin: 11.9 g/dL — ABNORMAL LOW (ref 12.0–15.0)
Immature Granulocytes: 0 %
Lymphocytes Relative: 49 %
Lymphs Abs: 1.3 10*3/uL (ref 0.7–4.0)
MCH: 32.5 pg (ref 26.0–34.0)
MCHC: 32.9 g/dL (ref 30.0–36.0)
MCV: 98.9 fL (ref 80.0–100.0)
Monocytes Absolute: 0.2 10*3/uL (ref 0.1–1.0)
Monocytes Relative: 7 %
Neutro Abs: 1 10*3/uL — ABNORMAL LOW (ref 1.7–7.7)
Neutrophils Relative %: 39 %
Platelet Count: 212 10*3/uL (ref 150–400)
RBC: 3.66 MIL/uL — ABNORMAL LOW (ref 3.87–5.11)
RDW: 13.5 % (ref 11.5–15.5)
Smear Review: NORMAL
WBC Count: 2.6 10*3/uL — ABNORMAL LOW (ref 4.0–10.5)
nRBC: 0 % (ref 0.0–0.2)

## 2025-01-24 NOTE — Progress Notes (Signed)
 New medications:probiotics with prebiotics & cranberry ([facility] essentials) and Omeprazole.  Diarrhea on / off (chronic), ever since taking arimidex .  SOB with doing the pet scan, parked far away and was wearing so many clothes.  Tingling to hands on / off.

## 2025-01-24 NOTE — Progress Notes (Signed)
 "    Hematology/Oncology Consult note [facility]  [phone] [phone]  Patient Care Team: Duanne Butler DASEN, MD as PCP - General (Family Medicine) Lonni Slain, MD as PCP - Cardiology (Cardiology) Marilynn Nest, DO as Consulting Physician (Obstetrics and Gynecology) Ethyl Lenis, MD as Consulting Physician (General Surgery) Dewey Rush, MD as Consulting Physician (Radiation Oncology) Melanee Annah BROCKS, MD as Consulting Physician (Oncology)   Name of the patient: Monique [name]  [id number]  November 19, 1956   Date of visit: 01/24/25  Diagnosis- 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  This was ER 100% positive, PR 95% positive HER2 negative Ki-67 15%.  She underwent left mastectomy and did not require any adjuvant radiation therapy.  Oncotype score came back low at 5 and therefore did not require any adjuvant chemotherapy.  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  Patient was initially started on Arimidex  which she could not tolerate due to dizziness and was switched to tamoxifen  which she could also not tolerate and subsequently stopped taking endocrine therapy shortly.  She was seen by both Dr. Odean and Dr. Lanny at [facility] back in 2018 and was subsequently lost to follow-up   Patient has been seeing Dr. Brenna this for evaluation of bilateral lung nodules and abnormal lung cancer screening CT noted in March 2023.  At that time she had a hypermetabolism with an SUV of 6 in the right hilum.  She had a bronchoscopy done and biopsies of the left bronchoscopy were negative as well as that of the hilum was negative.  There was appearance of a right lower lobe  lung nodule which was being followed given that it was spiculated.  There was a plan for repeat bronchoscopy in November 2023 but Dr. Brenna was sick at that time and patient did not wish to get bronchoscopy by any other pulmonary.  Bronchoscopy was therefore delayed and patient had PET CT scan in January 2024.  PET scan showed intense hypermetabolic activity again in the right hilum with no clear lesion.  Findings concerning for neoplasm obstructing the bronchus with the right middle lobe atelectasis.  Low level of metabolic activity in the right lower lobe.  No other evidence of distant metastatic disease.  Prior to that in October 2023 patient was noted to have bilateral lung nodules varying from 4 to 8 mm in size which could be potentially metastatic.   Had repeat bronchoscopy on 01/17/2023.  Right upper lobe endobronchial lesion brushing was positive for metastatic breast carcinoma.  Tumor cells positive for GATA3 and ER.  Negative for CK5/6, p40, TTF-1, chromogranin, Napsin synaptophysin and CD56.  Patient is consistent with metastatic carcinoma of breast origin. Tumor was 90% ER positive, 85% PR positive and HER2 negative. Patient started on letrozole  in February 2024.  Ibrance  started in March 2024.  Patient had significant arthralgias with letrozole  subsequently and was switched to Arimidex   Interval history- Monique [name] is a 69 year old female with metastatic breast cancer to the lung who presents for routine oncology follow-up to monitor disease status and treatment tolerance.  She denies breast pain, mass, discharge, abnormal bleeding, bruising, fevers, chills, or sweats. No new palpable lesions. Recent imaging was reviewed and the patient recalls  being told there were no new findings and that the previously biopsied area appeared unchanged.  She continues Ibrance  and anastrozole  with good tolerance. She notes the anastrozole  pill is very small and occasionally loses pills, resulting in early  refills. Most recent white blood cell count is 2.6, and the patient was informed this is expected with Ibrance  therapy. Awaiting neutrophil count.  She denies gastrointestinal symptoms and reports no significant alopecia from Ibrance . She denies bone pain or symptoms suggestive of bone complications. Last bone density study was in March 2024.  She reports weight gain since her last visit and increased activity with the addition of a new puppy, which has had a positive emotional impact. She has not experienced difficulties related to the recent snowstorm.       ECOG PS- 1 Pain scale- 0   Review of systems- Review of Systems  Constitutional:  Negative for chills, fever, malaise/fatigue and weight loss.  HENT:  Negative for congestion, ear discharge and nosebleeds.   Eyes:  Negative for blurred vision.  Respiratory:  Negative for cough, hemoptysis, sputum production, shortness of breath and wheezing.   Cardiovascular:  Negative for chest pain, palpitations, orthopnea and claudication.  Gastrointestinal:  Negative for abdominal pain, blood in stool, constipation, diarrhea, heartburn, melena, nausea and vomiting.  Genitourinary:  Negative for dysuria, flank pain, frequency, hematuria and urgency.  Musculoskeletal:  Negative for back pain, joint pain and myalgias.  Skin:  Negative for rash.  Neurological:  Negative for dizziness, tingling, focal weakness, seizures, weakness and headaches.  Endo/Heme/Allergies:  Does not bruise/bleed easily.  Psychiatric/Behavioral:  Negative for depression and suicidal ideas. The patient does not have insomnia.       Allergies[1]   Past Medical History:  Diagnosis Date   Abnormal EKG    Anxiety    Phreesia 11/29/2020   Arthritis    Phreesia 11/29/2020   Barrett's esophagus    Breast cancer (HCC) 2018   Left breast   Cancer (HCC)    Phreesia 11/29/2020   Complication of anesthesia    Family history of breast cancer    Family history of prostate  cancer    Family history of thyroid  cancer    Fatigue    Generalized headaches    headaches are less since using a mouth piece to prevent grinding of teeth   Hepatitis    as a teenager   IBS (irritable bowel syndrome)    Meniere disease    Osteopenia    Pneumonia    PONV (postoperative nausea and vomiting)    Psoriasis    Tinnitus    left ear     Past Surgical History:  Procedure Laterality Date   BREAST SURGERY N/A    Phreesia 11/29/2020   BRONCHIAL BIOPSY  01/17/2023   Procedure: BRONCHIAL BIOPSIES;  Surgeon: Brenna Adine CROME, DO;  Location: [facility];  Service: Pulmonary;;   BRONCHIAL BRUSHINGS  01/17/2023   Procedure: BRONCHIAL BRUSHINGS;  Surgeon: Brenna Adine CROME, DO;  Location: [facility];  Service: Pulmonary;;   DILATION AND CURETTAGE OF UTERUS     FINE NEEDLE ASPIRATION  05/10/2022   Procedure: FINE NEEDLE ASPIRATION (FNA) LINEAR;  Surgeon: Brenna Adine CROME, DO;  Location: [facility];  Service: Pulmonary;;   MASTECTOMY Left 2018   MASTECTOMY W/ SENTINEL NODE BIOPSY Left 08/03/2017   Procedure: LEFT MASTECTOMY WITH LEFT AXILLARY SENTINEL LYMPH NODE BIOPSY;  Surgeon: Ethyl Lenis, MD;  Location: [facility];  Service: General;  Laterality: Left;  skin cancer removal     TONSILLECTOMY     VIDEO BRONCHOSCOPY WITH ENDOBRONCHIAL ULTRASOUND N/A 05/10/2022   Procedure: VIDEO BRONCHOSCOPY WITH ENDOBRONCHIAL ULTRASOUND;  Surgeon: Brenna Adine CROME, DO;  Location: [facility];  Service: Pulmonary;  Laterality: N/A;   VIDEO BRONCHOSCOPY WITH ENDOBRONCHIAL ULTRASOUND Right 01/17/2023   Procedure: VIDEO BRONCHOSCOPY WITH ENDOBRONCHIAL ULTRASOUND;  Surgeon: Brenna Adine CROME, DO;  Location: [facility];  Service: Pulmonary;  Laterality: Right;    Social History   Socioeconomic History   Marital status: Married    Spouse name: Not on file   Number of children: Not on file   Years of education: Not on file   Highest education level: Associate degree:  occupational, scientist, product/process development, or vocational program  Occupational History   Not on file  Tobacco Use   Smoking status: Former    Current packs/day: 0.00    Average packs/day: 0.5 packs/day    Types: Cigarettes    Quit date: 2018    Years since quitting: 8.1    Passive exposure: Past   Smokeless tobacco: Never  Vaping Use   Vaping status: Never Used  Substance and Sexual Activity   Alcohol use: Yes    Comment: occas   Drug use: Never   Sexual activity: Not Currently    Birth control/protection: Post-menopausal  Other Topics Concern   Not on file  Social History Narrative   Not on file   Social Drivers of Health   Tobacco Use: Medium Risk (11/08/2024)   Patient History    Smoking Tobacco Use: Former    Smokeless Tobacco Use: Never    Passive Exposure: Past  Physicist, Medical Strain: Medium Risk (08/22/2024)   Overall Financial Resource Strain (CARDIA)    Difficulty of Paying Living Expenses: Somewhat hard  Food Insecurity: No Food Insecurity (08/22/2024)   Epic    Worried About Programme Researcher, Broadcasting/film/video in the Last Year: Never true    Ran Out of Food in the Last Year: Never true  Recent Concern: Food Insecurity - Food Insecurity Present (08/16/2024)   Epic    Worried About Programme Researcher, Broadcasting/film/video in the Last Year: Never true    Ran Out of Food in the Last Year: Sometimes true  Transportation Needs: No Transportation Needs (08/22/2024)   Epic    Lack of Transportation (Medical): No    Lack of Transportation (Non-Medical): No  Physical Activity: Sufficiently Active (08/22/2024)   Exercise Vital Sign    Days of Exercise per Week: 5 days    Minutes of Exercise per Session: 60 min  Stress: Stress Concern Present (08/22/2024)   [employer] of Occupational Health - Occupational Stress Questionnaire    Feeling of Stress: Very much  Social Connections: Moderately Integrated (08/22/2024)   Social Connection and Isolation Panel    Frequency of Communication with Friends and Family: More than  three times a week    Frequency of Social Gatherings with Friends and Family: Once a week    Attends Religious Services: 1 to 4 times per year    Active Member of [employer] or Organizations: No    Attends Banker Meetings: Never    Marital Status: Married  Catering Manager Violence: Unknown (08/22/2024)   Epic    Fear of Current or Ex-Partner: Patient declined    Emotionally Abused: Patient declined    Physically Abused: No    Sexually Abused: No  Depression (PHQ2-9): Low Risk (01/24/2025)   Depression (PHQ2-9)    PHQ-2 Score:  0  Alcohol Screen: Low Risk (08/22/2024)   Alcohol Screen    Last Alcohol Screening Score (AUDIT): 1  Housing: Unknown (08/22/2024)   Epic    Unable to Pay for Housing in the Last Year: No    Number of Times Moved in the Last Year: Not on file    Homeless in the Last Year: No  Utilities: Not At Risk (08/22/2024)   Epic    Threatened with loss of utilities: No  Health Literacy: Adequate Health Literacy (08/22/2024)   B1300 Health Literacy    Frequency of need for help with medical instructions: Never    Family History  Problem Relation Age of Onset   Hypertension Mother    Heart attack Father    Hypertension Father    Healthy Maternal Aunt    Healthy Maternal Uncle    Breast cancer Paternal Aunt 6       bilateral   Healthy Paternal Aunt    Prostate cancer Paternal Uncle    Thyroid  cancer Paternal Uncle    Healthy Paternal Uncle    Breast cancer Cousin 24       paternal first cousin   Breast cancer Cousin        paternal first cousin   Colon cancer Neg Hx    Esophageal cancer Neg Hx    Rectal cancer Neg Hx    Stomach cancer Neg Hx     Current Medications[2]  Physical exam:  Vitals:   01/24/25 1116  BP: 131/77  Pulse: (!) 54  Resp: 17  Temp: (!) 96.9 F (36.1 C)  TempSrc: Tympanic  SpO2: 98%  Weight: 140 lb 12.8 oz (63.9 kg)  Height: 5' 1 (1.549 m)   Physical Exam Cardiovascular:     Rate and Rhythm: Normal rate and regular  rhythm.     Heart sounds: Normal heart sounds.  Pulmonary:     Effort: Pulmonary effort is normal.     Breath sounds: Normal breath sounds.  Skin:    General: Skin is warm and dry.  Neurological:     Mental Status: She is alert and oriented to person, place, and time.      I have personally reviewed labs listed below:    Latest Ref Rng & Units 01/24/2025   10:54 AM  CMP  Glucose 70 - 99 mg/dL 97   BUN 8 - 23 mg/dL 12   Creatinine 9.55 - 1.00 mg/dL 9.02   Sodium 864 - 854 mmol/L 138   Potassium 3.5 - 5.1 mmol/L 4.2   Chloride 98 - 111 mmol/L 100   CO2 22 - 32 mmol/L 25   Calcium  8.9 - 10.3 mg/dL 9.3   Total Protein 6.5 - 8.1 g/dL 7.3   Total Bilirubin 0.0 - 1.2 mg/dL 0.4   Alkaline Phos 38 - 126 U/L 64   AST 15 - 41 U/L 18   ALT 0 - 44 U/L <5       Latest Ref Rng & Units 01/24/2025   10:54 AM  CBC  WBC 4.0 - 10.5 K/uL 2.6   Hemoglobin 12.0 - 15.0 g/dL 88.0   Hematocrit 63.9 - 46.0 % 36.2   Platelets 150 - 400 K/uL 212    I have personally reviewed Radiology images listed below: No images are attached to the encounter.  NM PET Image Restag (PS) Skull Base To Thigh Result Date: 01/09/2025 CLINICAL DATA:  Subsequent treatment strategy for breast cancer. EXAM: NUCLEAR MEDICINE PET SKULL BASE TO THIGH TECHNIQUE:  7.82 mCi F-18 FDG was injected intravenously. Full-ring PET imaging was performed from the skull base to thigh after the radiotracer. CT data was obtained and used for attenuation correction and anatomic localization. Fasting blood glucose: 84 mg/dl COMPARISON:  Multiple prior PET CTs.  The most recent is 07/12/2024 FINDINGS: Mediastinal blood pool activity: SUV max 3.2 Liver activity: SUV max NA NECK: No hypermetabolic lymph nodes in the neck. Incidental CT findings: None. CHEST: There is a persistent area hypermetabolism noted in the right hilar region. SUV max is 5.6 and was previously 5.2. This is the same area seen on the PET-CT from 2023. May be due to chronic partial  collapse of the right upper lobe and chronic inflammation. There is no new or progressive findings on the PET scan with a CT scan to suggest this is persistent tumor. No other enlarged or hypermetabolic mediastinal or hilar lymph nodes. No supraclavicular or axillary adenopathy. No hypermetabolic breast masses. Stable 6 mm right upper lobe pulmonary nodule on image 35/6. Persistent low level hypermetabolism with SUV max of 1.9. No new or progressive changes. No new pulmonary lesions. Stable partial medial right upper lobe collapse. Incidental CT findings: Stable aortic and coronary artery calcifications. ABDOMEN/PELVIS: No abnormal hypermetabolic activity within the liver, pancreas, adrenal glands, or spleen. No hypermetabolic lymph nodes in the abdomen or pelvis. Incidental CT findings: Stable scattered vascular calcifications. No aneurysm. Stable simple right renal cyst. SKELETON: No focal hypermetabolic activity to suggest skeletal metastasis. Incidental CT findings: None. IMPRESSION: 1. Persistent area of hypermetabolism in the right hilar region. No new or progressive findings on the PET scan or CT scan to suggest this is persistent tumor. 2. Stable 6 mm right upper lobe pulmonary nodule with low level hypermetabolism. 3. No findings for metastatic disease involving the abdomen/pelvis or bony structures. 4. Aortic atherosclerosis. Aortic Atherosclerosis (ICD10-I70.0). Electronically Signed   By: MYRTIS Stammer M.D.   On: 01/09/2025 16:28     Assessment and plan- Patient is a 69 y.o. 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 PET scan shows stable area of hypermetabolism in the right hilar region which was biopsy-proven intrabronchial  metastatic breast cancer.  Stable 6 mm right upper lobe lung nodule Well-managed with no interval change on imaging.  - Continued Ibrance  and anastrozole  therapy. - I will see her back in 3 months with CBC with differential CMP and CA 27-29  Drug-induced neutropenia Expected neutropenia from Ibrance  with WBC count of 2.6. Awaiting neutrophil count. - Monitored complete blood count, including neutrophil count.  Osteoporosis risk due to aromatase inhibitor therapy Increased osteoporosis risk from aromatase inhibitor therapy. Last bone density scan in March 2024. - Ordered bone density scan prior to next visit.         Visit Diagnosis 1. 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 High risk medication use   3. Visit for monitoring Arimidex  therapy      Dr. Annah Skene, MD, MPH [facility] [phone] 01/24/2025 3:15 PM                   [1]  Allergies Allergen Reactions   Chantix [Varenicline]     Hallucination   Codeine Nausea And Vomiting  Wellbutrin  [Bupropion ] Other (See Comments)    Hallucinations    [2]  Current Outpatient Medications:    anastrozole  (ARIMIDEX ) 1 MG tablet, Take 1 tablet (1 mg total) by mouth daily., Disp: 90 tablet, Rfl: 3   diazepam  (VALIUM ) 10 MG tablet, Take 1 tablet (10 mg total) by mouth every 12 (twelve) hours as needed for anxiety., Disp: 30 tablet, Rfl: 3   hydrocortisone  2.5 % cream, Apply topically as a thin layer twice daily to affected area as needed., Disp: 30 g, Rfl: 2   hydrocortisone  2.5 % cream, Apply 1 Application topically in the morning and at bedtime., Disp: 30 g, Rfl: 2   Multiple Vitamins-Minerals (AIRBORNE PO), Take 1 tablet by mouth daily as needed (immune support)., Disp: , Rfl:    NONFORMULARY OR COMPOUNDED ITEM, daily., Disp: , Rfl:    omeprazole (PRILOSEC OTC) 20 MG tablet, Take 20 mg by mouth daily., Disp: , Rfl:    palbociclib  (IBRANCE ) 75 MG tablet, Take 1 tablet  (75 mg total) by mouth daily. Take for 21 days on, 7 days off, repeat every 28 days., Disp: 21 tablet, Rfl: 6   psyllium (HYDROCIL/METAMUCIL) 95 % PACK, Take 1 packet by mouth daily., Disp: , Rfl:    cephALEXin  (KEFLEX ) 500 MG capsule, Take 1 capsule (500 mg total) by mouth 3 (three) times daily. (Patient not taking: Reported on 01/24/2025), Disp: 15 capsule, Rfl: 0   escitalopram  (LEXAPRO ) 10 MG tablet, Take 1 tablet (10 mg total) by mouth daily. (Patient not taking: Reported on 01/24/2025), Disp: 90 tablet, Rfl: 1   gabapentin  (NEURONTIN ) 300 MG capsule, Take 1 capsule (300 mg total) by mouth 3 (three) times daily as needed (nerve itching). (Patient not taking: Reported on 01/24/2025), Disp: 90 capsule, Rfl: 3   lactobacillus acidophilus (BACID) TABS tablet, Take 1 tablet by mouth as needed. (Patient not taking: Reported on 01/24/2025), Disp: , Rfl:   Current Facility-Administered Medications:    0.9 %  sodium chloride  infusion, 500 mL, Intravenous, Once, Stacia Glendia BRAVO, MD  "

## 2025-02-04 ENCOUNTER — Ambulatory Visit: Admitting: Obstetrics & Gynecology

## 2025-02-18 ENCOUNTER — Encounter: Payer: Medicare Other | Admitting: Family Medicine

## 2025-02-27 ENCOUNTER — Other Ambulatory Visit

## 2025-04-22 ENCOUNTER — Inpatient Hospital Stay

## 2025-04-22 ENCOUNTER — Inpatient Hospital Stay: Admitting: Oncology

## 2025-04-23 ENCOUNTER — Inpatient Hospital Stay: Admitting: Oncology

## 2025-04-23 ENCOUNTER — Inpatient Hospital Stay

## 2025-08-28 ENCOUNTER — Encounter
